# Patient Record
Sex: Female | Born: 1940 | Race: White | Hispanic: No | Smoking: Never smoker
Health system: Southern US, Community
[De-identification: ages and names within clinical notes are randomized; demographics above are authoritative.]

## PROBLEM LIST (undated history)

## (undated) DIAGNOSIS — J45909 Unspecified asthma, uncomplicated: Secondary | ICD-10-CM

## (undated) DIAGNOSIS — Z8739 Personal history of other diseases of the musculoskeletal system and connective tissue: Secondary | ICD-10-CM

## (undated) DIAGNOSIS — IMO0001 Reserved for inherently not codable concepts without codable children: Secondary | ICD-10-CM

## (undated) DIAGNOSIS — F419 Anxiety disorder, unspecified: Secondary | ICD-10-CM

## (undated) DIAGNOSIS — I499 Cardiac arrhythmia, unspecified: Secondary | ICD-10-CM

## (undated) DIAGNOSIS — J189 Pneumonia, unspecified organism: Secondary | ICD-10-CM

## (undated) DIAGNOSIS — I10 Essential (primary) hypertension: Secondary | ICD-10-CM

## (undated) DIAGNOSIS — I4891 Unspecified atrial fibrillation: Secondary | ICD-10-CM

## (undated) DIAGNOSIS — E785 Hyperlipidemia, unspecified: Secondary | ICD-10-CM

## (undated) HISTORY — PX: ANKLE FRACTURE SURGERY: SHX122

## (undated) HISTORY — PX: BACK SURGERY: SHX140

## (undated) HISTORY — PX: LUMBAR DISC SURGERY: SHX700

## (undated) HISTORY — PX: FRACTURE SURGERY: SHX138

## (undated) HISTORY — DX: Unspecified atrial fibrillation: I48.91

## (undated) HISTORY — PX: TONSILLECTOMY AND ADENOIDECTOMY: SUR1326

---

## 2000-06-16 ENCOUNTER — Emergency Department (HOSPITAL_COMMUNITY): Admission: EM | Admit: 2000-06-16 | Discharge: 2000-06-16 | Payer: Self-pay | Admitting: Emergency Medicine

## 2000-11-22 ENCOUNTER — Emergency Department (HOSPITAL_COMMUNITY): Admission: EM | Admit: 2000-11-22 | Discharge: 2000-11-22 | Payer: Self-pay | Admitting: *Deleted

## 2002-07-05 ENCOUNTER — Inpatient Hospital Stay (HOSPITAL_COMMUNITY): Admission: EM | Admit: 2002-07-05 | Discharge: 2002-07-15 | Payer: Self-pay | Admitting: *Deleted

## 2002-07-05 ENCOUNTER — Encounter: Payer: Self-pay | Admitting: Emergency Medicine

## 2002-07-09 ENCOUNTER — Encounter: Payer: Self-pay | Admitting: Internal Medicine

## 2002-07-11 ENCOUNTER — Encounter: Payer: Self-pay | Admitting: Internal Medicine

## 2002-07-13 ENCOUNTER — Encounter: Payer: Self-pay | Admitting: Internal Medicine

## 2003-10-10 ENCOUNTER — Emergency Department (HOSPITAL_COMMUNITY): Admission: EM | Admit: 2003-10-10 | Discharge: 2003-10-10 | Payer: Self-pay | Admitting: Emergency Medicine

## 2010-05-15 ENCOUNTER — Inpatient Hospital Stay (INDEPENDENT_AMBULATORY_CARE_PROVIDER_SITE_OTHER)
Admission: RE | Admit: 2010-05-15 | Discharge: 2010-05-15 | Disposition: A | Discharge status: Home / Self Care | Payer: Medicare Other | Source: Ambulatory Visit | Attending: Emergency Medicine | Admitting: Emergency Medicine

## 2010-05-15 ENCOUNTER — Ambulatory Visit (INDEPENDENT_AMBULATORY_CARE_PROVIDER_SITE_OTHER): Payer: Medicare Other

## 2010-05-15 DIAGNOSIS — R229 Localized swelling, mass and lump, unspecified: Secondary | ICD-10-CM

## 2010-05-15 DIAGNOSIS — M79609 Pain in unspecified limb: Secondary | ICD-10-CM

## 2010-05-15 LAB — CBC
MCH: 29.5 pg (ref 26.0–34.0)
MCHC: 33.2 g/dL (ref 30.0–36.0)
Platelets: 350 10*3/uL (ref 150–400)
RDW: 13.1 % (ref 11.5–15.5)

## 2010-05-15 LAB — URIC ACID: Uric Acid, Serum: 10.2 mg/dL — ABNORMAL HIGH (ref 2.4–7.0)

## 2013-01-19 ENCOUNTER — Encounter (HOSPITAL_COMMUNITY): Payer: Self-pay | Admitting: Emergency Medicine

## 2013-01-19 ENCOUNTER — Emergency Department (HOSPITAL_COMMUNITY)
Admission: EM | Admit: 2013-01-19 | Discharge: 2013-01-19 | Disposition: A | Discharge status: Home / Self Care | Payer: Medicare Other | Attending: Emergency Medicine | Admitting: Emergency Medicine

## 2013-01-19 ENCOUNTER — Emergency Department (HOSPITAL_COMMUNITY): Payer: Medicare Other

## 2013-01-19 DIAGNOSIS — R509 Fever, unspecified: Secondary | ICD-10-CM | POA: Insufficient documentation

## 2013-01-19 DIAGNOSIS — Z862 Personal history of diseases of the blood and blood-forming organs and certain disorders involving the immune mechanism: Secondary | ICD-10-CM | POA: Insufficient documentation

## 2013-01-19 DIAGNOSIS — Z8639 Personal history of other endocrine, nutritional and metabolic disease: Secondary | ICD-10-CM | POA: Insufficient documentation

## 2013-01-19 DIAGNOSIS — I10 Essential (primary) hypertension: Secondary | ICD-10-CM | POA: Insufficient documentation

## 2013-01-19 DIAGNOSIS — L02619 Cutaneous abscess of unspecified foot: Secondary | ICD-10-CM | POA: Insufficient documentation

## 2013-01-19 DIAGNOSIS — M25579 Pain in unspecified ankle and joints of unspecified foot: Secondary | ICD-10-CM | POA: Insufficient documentation

## 2013-01-19 DIAGNOSIS — J45909 Unspecified asthma, uncomplicated: Secondary | ICD-10-CM | POA: Insufficient documentation

## 2013-01-19 DIAGNOSIS — L03119 Cellulitis of unspecified part of limb: Secondary | ICD-10-CM

## 2013-01-19 DIAGNOSIS — L0390 Cellulitis, unspecified: Secondary | ICD-10-CM

## 2013-01-19 DIAGNOSIS — R209 Unspecified disturbances of skin sensation: Secondary | ICD-10-CM | POA: Insufficient documentation

## 2013-01-19 DIAGNOSIS — M79671 Pain in right foot: Secondary | ICD-10-CM

## 2013-01-19 HISTORY — DX: Unspecified asthma, uncomplicated: J45.909

## 2013-01-19 HISTORY — DX: Essential (primary) hypertension: I10

## 2013-01-19 MED ORDER — HYDROCODONE-ACETAMINOPHEN 5-325 MG PO TABS: 1.0000 | ORAL_TABLET | ORAL | Status: DC | PRN

## 2013-01-19 MED ORDER — SULFAMETHOXAZOLE-TRIMETHOPRIM 800-160 MG PO TABS: 1.0000 | ORAL_TABLET | Freq: Two times a day (BID) | ORAL | Status: AC

## 2013-01-19 MED ORDER — HYDROCODONE-ACETAMINOPHEN 5-325 MG PO TABS
1.0000 | ORAL_TABLET | Freq: Once | ORAL | Status: AC
  Administered 2013-01-19: 1 via ORAL
  Filled 2013-01-19: qty 1

## 2013-01-19 MED ORDER — IBUPROFEN 600 MG PO TABS: 600.0000 mg | ORAL_TABLET | Freq: Four times a day (QID) | ORAL | Status: DC | PRN

## 2013-01-19 MED ORDER — IBUPROFEN 200 MG PO TABS
600.0000 mg | ORAL_TABLET | Freq: Once | ORAL | Status: AC
  Administered 2013-01-19: 600 mg via ORAL
  Filled 2013-01-19: qty 3

## 2013-01-19 NOTE — ED Notes (Signed)
C/o right foot swollen and painful, started 2 days ago, states has happened before, did not decide what caused it

## 2013-01-19 NOTE — Discharge Instructions (Signed)
Possible Early Right Foot Cellulitis Cellulitis is an infection of the skin and the tissue beneath it. The infected area is usually red and tender. Cellulitis occurs most often in the arms and lower legs.  CAUSES  Cellulitis is caused by bacteria that enter the skin through cracks or cuts in the skin. The most common types of bacteria that cause cellulitis are Staphylococcus and Streptococcus. SYMPTOMS   Redness and warmth.  Swelling.  Tenderness or pain.  Fever. DIAGNOSIS  Your caregiver can usually determine what is wrong based on a physical exam. Blood tests may also be done. TREATMENT  Treatment usually involves taking an antibiotic medicine. HOME CARE INSTRUCTIONS   Take your antibiotics as directed. Finish them even if you start to feel better.  Keep the infected arm or leg elevated to reduce swelling.  Apply a warm cloth to the affected area up to 4 times per day to relieve pain.  Only take over-the-counter or prescription medicines for pain, discomfort, or fever as directed by your caregiver.  Keep all follow-up appointments as directed by your caregiver. SEEK MEDICAL CARE IF:   You notice red streaks coming from the infected area.  Your red area gets larger or turns dark in color.  Your bone or joint underneath the infected area becomes painful after the skin has healed.  Your infection returns in the same area or another area.  You notice a swollen bump in the infected area.  You develop new symptoms. SEEK IMMEDIATE MEDICAL CARE IF:   You have a fever.  You feel very sleepy.  You develop vomiting or diarrhea.  You have a general ill feeling (malaise) with muscle aches and pains. MAKE SURE YOU:   Understand these instructions.  Will watch your condition.  Will get help right away if you are not doing well or get worse. Document Released: 10/13/2004 Document Revised: 07/05/2011 Document Reviewed: 03/21/2011 [facility] Patient Information 2014  [place].  Possible Tendinitis Tendinitis is swelling and inflammation of the tendons. Tendons are band-like tissues that connect muscle to bone. Tendinitis commonly occurs in the:   Shoulders (rotator cuff).  Heels (Achilles tendon).  Elbows (triceps tendon). CAUSES Tendinitis is usually caused by overusing the tendon, muscles, and joints involved. When the tissue surrounding a tendon (synovium) becomes inflamed, it is called tenosynovitis. Tendinitis commonly develops in people whose jobs require repetitive motions. SYMPTOMS  Pain.  Tenderness.  Mild swelling. DIAGNOSIS Tendinitis is usually diagnosed by physical exam. Your caregiver may also order X-rays or other imaging tests. TREATMENT Your caregiver may recommend certain medicines or exercises for your treatment. HOME CARE INSTRUCTIONS   Use a sling or splint for as long as directed by your caregiver until the pain decreases.  Put ice on the injured area.  Put ice in a plastic bag.  Place a towel between your skin and the bag.  Leave the ice on for 15-20 minutes, 03-04 times a day.  Avoid using the limb while the tendon is painful. Perform gentle range of motion exercises only as directed by your caregiver. Stop exercises if pain or discomfort increase, unless directed otherwise by your caregiver.  Only take over-the-counter or prescription medicines for pain, discomfort, or fever as directed by your caregiver. SEEK MEDICAL CARE IF:   Your pain and swelling increase.  You develop new, unexplained symptoms, especially increased numbness in the hands. MAKE SURE YOU:   Understand these instructions.  Will watch your condition.  Will get help right away if you are  not doing well or get worse. Document Released: 01/01/2000 Document Revised: 03/28/2011 Document Reviewed: 03/22/2010 [facility] Patient Information 2014 [place], [place].  RICE: Routine Care for Injuries The routine care of many injuries includes  Rest, Ice, Compression, and Elevation (RICE). HOME CARE INSTRUCTIONS  Rest is needed to allow your body to heal. Routine activities can usually be resumed when comfortable. Injured tendons and bones can take up to 6 weeks to heal. Tendons are the cord-like structures that attach muscle to bone.  Ice following an injury helps keep the swelling down and reduces pain.  Put ice in a plastic bag.  Place a towel between your skin and the bag.  Leave the ice on for 15-20 minutes, 03-04 times a day. Do this while awake, for the first 24 to 48 hours. After that, continue as directed by your caregiver.  Compression helps keep swelling down. It also gives support and helps with discomfort. If an elastic bandage has been applied, it should be removed and reapplied every 3 to 4 hours. It should not be applied tightly, but firmly enough to keep swelling down. Watch fingers or toes for swelling, bluish discoloration, coldness, numbness, or excessive pain. If any of these problems occur, remove the bandage and reapply loosely. Contact your caregiver if these problems continue.  Elevation helps reduce swelling and decreases pain. With extremities, such as the arms, hands, legs, and feet, the injured area should be placed near or above the level of the heart, if possible. SEEK IMMEDIATE MEDICAL CARE IF:  You have persistent pain and swelling.  You develop redness, numbness, or unexpected weakness.  Your symptoms are getting worse rather than improving after several days. These symptoms may indicate that further evaluation or further X-rays are needed. Sometimes, X-rays may not show a small broken bone (fracture) until 1 week or 10 days later. Make a follow-up appointment with your caregiver. Ask when your X-ray results will be ready. Make sure you get your X-ray results. Document Released: 04/17/2000 Document Revised: 03/28/2011 Document Reviewed: 06/04/2010 [facility] Patient Information 2014 [place],  [place].

## 2013-01-19 NOTE — ED Provider Notes (Signed)
TIME SEEN: 4:27 PM  CHIEF COMPLAINT: Right foot pain  HPI: Patient is a 73 year old female with a history of hypertension, asthma, gout who presents emergency department with right foot pain that started yesterday. She states that over the holidays she's been walking more than normal and wearing no shoes. She denies any history of any injury. She states that she has had some numbness in the top of her foot secondary to swelling. There is some redness and warmth but she denies any fevers or chills, nausea, vomiting or diarrhea. She has a history of gout but states this feels different.  ROS: See HPI Constitutional: no fever  Eyes: no drainage  ENT: no runny nose   Cardiovascular:  no chest pain  Resp: no SOB  GI: no vomiting GU: no dysuria Integumentary: no rash  Allergy: no hives  Musculoskeletal: no leg swelling  Neurological: no slurred speech ROS otherwise negative  PAST MEDICAL HISTORY/PAST SURGICAL HISTORY:  Past Medical History  Diagnosis Date  . Hypertension   . Asthma     MEDICATIONS:  Prior to Admission medications   Not on File    ALLERGIES:  No Known Allergies  SOCIAL HISTORY:  History  Substance Use Topics  . Smoking status: Never Smoker   . Smokeless tobacco: Not on file  . Alcohol Use: Yes     Comment: occ    FAMILY HISTORY: History reviewed. No pertinent family history.  EXAM: BP 145/70  Pulse 95  Temp(Src) 99.5 F (37.5 C) (Oral)  Resp 20  SpO2 96% CONSTITUTIONAL: Alert and oriented and responds appropriately to questions. Well-appearing; well-nourished HEAD: Normocephalic EYES: Conjunctivae clear, PERRL ENT: normal nose; no rhinorrhea; moist mucous membranes; pharynx without lesions noted NECK: Supple, no meningismus, no LAD  CARD: RRR; S1 and S2 appreciated; no murmurs, no clicks, no rubs, no gallops RESP: Normal chest excursion without splinting or tachypnea; breath sounds clear and equal bilaterally; no wheezes, no rhonchi, no rales,   ABD/GI: Normal bowel sounds; non-distended; soft, non-tender, no rebound, no guarding BACK:  The back appears normal and is non-tender to palpation, there is no CVA tenderness EXT: Patient has right dorsal soft tissue swelling with erythema to her toes diffusely and warmth, no induration, 2+ DP pulses bilaterally, no posterior calf tenderness, full range of motion in all joints, no joint effusion, no fluctuance, drainage, no open wound, no bony tenderness, otherwise Normal ROM in all joints; non-tender to palpation; no edema; normal capillary refill; no cyanosis    SKIN: Normal color for age and race; warm NEURO: Moves all extremities equally PSYCH: The patient's mood and manner are appropriate. Grooming and personal hygiene are appropriate.  MEDICAL DECISION MAKING: Patient here with temp of 99.5 and redness, warmth and swelling to her right foot. It is not localized to any joint. Do not suspect septic arthritis or gout. Possible early cellulitis versus sprain, tendinitis from increased ambulation and wearing new shoes. X-rays pending. We'll give pain medication. Anticipate discharge home with outpatient followup, oral antibiotics, pain medication. Patient reports she believes she has a walker at home.  ED PROGRESS: X-ray show no subcutaneous gas, fracture or dislocation. Suspect possible early cellulitis versus sprain versus tendinitis. We'll discharge home with pain medication and Bactrim. Given return precautions and orthopedic followup. Patient has a walker at home. Patient and family verbalized understanding and are comfortable with plan.     [place], DO 01/19/13 1723

## 2013-02-13 ENCOUNTER — Other Ambulatory Visit: Payer: Self-pay | Admitting: Family Medicine

## 2013-02-13 DIAGNOSIS — R29818 Other symptoms and signs involving the nervous system: Secondary | ICD-10-CM

## 2013-02-13 DIAGNOSIS — R2990 Unspecified symptoms and signs involving the nervous system: Secondary | ICD-10-CM

## 2013-02-14 ENCOUNTER — Ambulatory Visit
Admission: RE | Admit: 2013-02-14 | Discharge: 2013-02-14 | Disposition: A | Discharge status: Home / Self Care | Payer: Medicare Other | Source: Ambulatory Visit | Attending: Family Medicine | Admitting: Family Medicine

## 2013-02-14 DIAGNOSIS — R2990 Unspecified symptoms and signs involving the nervous system: Secondary | ICD-10-CM

## 2013-02-20 ENCOUNTER — Encounter: Payer: Self-pay | Admitting: Cardiology

## 2013-02-20 ENCOUNTER — Other Ambulatory Visit (HOSPITAL_COMMUNITY): Payer: Self-pay | Admitting: *Deleted

## 2013-02-20 ENCOUNTER — Ambulatory Visit (HOSPITAL_COMMUNITY): Payer: Medicare Other | Attending: Cardiology | Admitting: Cardiology

## 2013-02-20 DIAGNOSIS — I4891 Unspecified atrial fibrillation: Secondary | ICD-10-CM

## 2013-02-20 DIAGNOSIS — E785 Hyperlipidemia, unspecified: Secondary | ICD-10-CM | POA: Insufficient documentation

## 2013-02-20 DIAGNOSIS — I059 Rheumatic mitral valve disease, unspecified: Secondary | ICD-10-CM | POA: Insufficient documentation

## 2013-02-20 DIAGNOSIS — I10 Essential (primary) hypertension: Secondary | ICD-10-CM | POA: Insufficient documentation

## 2013-02-20 NOTE — Progress Notes (Signed)
Echo performed. 

## 2013-02-25 ENCOUNTER — Other Ambulatory Visit: Payer: Medicare Other

## 2013-03-31 ENCOUNTER — Emergency Department (INDEPENDENT_AMBULATORY_CARE_PROVIDER_SITE_OTHER): Payer: Medicare Other

## 2013-03-31 ENCOUNTER — Emergency Department (HOSPITAL_COMMUNITY)
Admission: EM | Admit: 2013-03-31 | Discharge: 2013-03-31 | Disposition: A | Discharge status: Home / Self Care | Payer: Medicare Other | Source: Home / Self Care | Attending: Family Medicine | Admitting: Family Medicine

## 2013-03-31 DIAGNOSIS — M109 Gout, unspecified: Secondary | ICD-10-CM

## 2013-03-31 LAB — CBC WITH DIFFERENTIAL/PLATELET
Basophils Absolute: 0 10*3/uL (ref 0.0–0.1)
Basophils Relative: 0 % (ref 0–1)
EOS ABS: 0 10*3/uL (ref 0.0–0.7)
EOS PCT: 0 % (ref 0–5)
HEMATOCRIT: 39.7 % (ref 36.0–46.0)
HEMOGLOBIN: 13.2 g/dL (ref 12.0–15.0)
LYMPHS ABS: 1.3 10*3/uL (ref 0.7–4.0)
Lymphocytes Relative: 8 % — ABNORMAL LOW (ref 12–46)
MCH: 29.3 pg (ref 26.0–34.0)
MCHC: 33.2 g/dL (ref 30.0–36.0)
MCV: 88 fL (ref 78.0–100.0)
MONO ABS: 1.2 10*3/uL — AB (ref 0.1–1.0)
MONOS PCT: 8 % (ref 3–12)
NEUTROS PCT: 83 % — AB (ref 43–77)
Neutro Abs: 12.6 10*3/uL — ABNORMAL HIGH (ref 1.7–7.7)
Platelets: 309 10*3/uL (ref 150–400)
RBC: 4.51 MIL/uL (ref 3.87–5.11)
RDW: 14 % (ref 11.5–15.5)
WBC: 16 10*3/uL — ABNORMAL HIGH (ref 4.0–10.5)

## 2013-03-31 LAB — POCT I-STAT, CHEM 8
BUN: 19 mg/dL (ref 6–23)
CALCIUM ION: 1.17 mmol/L (ref 1.13–1.30)
CHLORIDE: 103 meq/L (ref 96–112)
Creatinine, Ser: 1 mg/dL (ref 0.50–1.10)
Glucose, Bld: 191 mg/dL — ABNORMAL HIGH (ref 70–99)
HCT: 43 % (ref 36.0–46.0)
Hemoglobin: 14.6 g/dL (ref 12.0–15.0)
Potassium: 3.6 mEq/L — ABNORMAL LOW (ref 3.7–5.3)
Sodium: 142 mEq/L (ref 137–147)
TCO2: 25 mmol/L (ref 0–100)

## 2013-03-31 LAB — D-DIMER, QUANTITATIVE (NOT AT ARMC): D DIMER QUANT: 0.97 ug{FEU}/mL — AB (ref 0.00–0.48)

## 2013-03-31 LAB — SEDIMENTATION RATE: Sed Rate: 27 mm/hr — ABNORMAL HIGH (ref 0–22)

## 2013-03-31 MED ORDER — INDOMETHACIN 25 MG PO CAPS: 25.0000 mg | ORAL_CAPSULE | Freq: Three times a day (TID) | ORAL | Status: DC | PRN

## 2013-03-31 MED ORDER — HYDROCODONE-ACETAMINOPHEN 5-325 MG PO TABS: 1.0000 | ORAL_TABLET | Freq: Four times a day (QID) | ORAL | Status: DC | PRN

## 2013-03-31 MED ORDER — METHYLPREDNISOLONE ACETATE 40 MG/ML IJ SUSP
80.0000 mg | Freq: Once | INTRAMUSCULAR | Status: AC
  Administered 2013-03-31: 80 mg via INTRAMUSCULAR

## 2013-03-31 MED ORDER — METHYLPREDNISOLONE ACETATE 80 MG/ML IJ SUSP
INTRAMUSCULAR | Status: AC
  Filled 2013-03-31: qty 1

## 2013-03-31 MED ORDER — COLCHICINE 0.6 MG PO TABS: 0.6000 mg | ORAL_TABLET | Freq: Two times a day (BID) | ORAL | Status: DC

## 2013-03-31 NOTE — ED Notes (Signed)
Reports swelling of right index finger x 1 wk.  Swelling through out hand started Friday.    Pt states that on Thursday prior to whole hand swelling she past out while on the toilet hitting the floor pt is unsure if the swelling is due to fall.    New medication for tachy cardia started one week ago.    Pt has tried soaking hand with no relief .

## 2013-03-31 NOTE — ED Provider Notes (Signed)
Medical screening examination/treatment/procedure(s) were performed by resident physician or non-physician practitioner and as supervising physician I was immediately available for consultation/collaboration.   Pauline Good MD.   Billy Fischer, MD 03/31/13 [phone]

## 2013-03-31 NOTE — Discharge Instructions (Signed)

## 2013-03-31 NOTE — ED Provider Notes (Signed)
CSN: [id number]     Arrival date & time 03/31/13  1237 History   First MD Initiated Contact with Patient 03/31/13 1420     Chief Complaint  Patient presents with  . Hand Pain   (Consider location/radiation/quality/duration/timing/severity/associated sxs/prior Treatment) HPI Comments: 73 year old female presents for evaluation of right hand pain, redness, swelling. This started about one week ago, getting progressively worse. She has the day before this began, she had a fall in the bathroom but did not immediately have any pain. She does not think she hit her hand on anything but she cannot be sure. A few days before this began, she has what she describes as a painful cyst in the end of her right ring finger. She says she has had similar issues to this in her feet before. Her doctor initially thought she had gout but she says they ruled this out. No fever, chills, NVD, SOB.  No numbness in the hand.    Patient is a 73 y.o. female presenting with hand pain.  Hand Pain Pertinent negatives include no chest pain, no abdominal pain and no shortness of breath.    Past Medical History  Diagnosis Date  . Hypertension   . Asthma    Past Surgical History  Procedure Laterality Date  . Back surgery     No family history on file. History  Substance Use Topics  . Smoking status: Never Smoker   . Smokeless tobacco: Not on file  . Alcohol Use: Yes     Comment: occ   OB History   Grav Para Term Preterm Abortions TAB SAB Ect Mult Living                 Review of Systems  Constitutional: Negative for fever and chills.  Eyes: Negative for visual disturbance.  Respiratory: Negative for cough and shortness of breath.   Cardiovascular: Negative for chest pain, palpitations and leg swelling.  Gastrointestinal: Negative for nausea, vomiting and abdominal pain.  Endocrine: Negative for polydipsia and polyuria.  Genitourinary: Negative for dysuria, urgency and frequency.  Musculoskeletal: Positive  for arthralgias and joint swelling. Negative for myalgias.  Skin: Negative for rash.  Neurological: Negative for dizziness, weakness and light-headedness.    Allergies  Review of patient's allergies indicates no known allergies.  Home Medications   Current Outpatient Rx  Name  Route  Sig  Dispense  Refill  . atorvastatin (LIPITOR) 20 MG tablet   Oral   Take 20 mg by mouth daily.         . metoprolol succinate (TOPROL-XL) 25 MG 24 hr tablet   Oral   Take 25 mg by mouth daily.         . pramipexole (MIRAPEX) 0.125 MG tablet   Oral   Take 0.125 mg by mouth 3 (three) times daily.         . TRIAMTERENE PO   Oral   Take by mouth.         . colchicine 0.6 MG tablet   Oral   Take 1 tablet (0.6 mg total) by mouth 2 (two) times daily.   20 tablet   0     Take until hand pain resolves   . HYDROcodone-acetaminophen (NORCO) 5-325 MG per tablet   Oral   Take 1 tablet by mouth every 6 (six) hours as needed for moderate pain.   15 tablet   0   . HYDROcodone-acetaminophen (NORCO/VICODIN) 5-325 MG per tablet   Oral   Take 1  tablet by mouth every 4 (four) hours as needed.   15 tablet   0   . ibuprofen (ADVIL,MOTRIN) 600 MG tablet   Oral   Take 1 tablet (600 mg total) by mouth every 6 (six) hours as needed.   15 tablet   0   . indomethacin (INDOCIN) 25 MG capsule   Oral   Take 1 capsule (25 mg total) by mouth 3 (three) times daily as needed.   30 capsule   0     Take until hand pain resolves    BP 151/75  Pulse 131  Temp(Src) 98.5 F (36.9 C) (Oral)  Resp 18  SpO2 99% Physical Exam  Nursing note and vitals reviewed. Constitutional: She is oriented to person, place, and time. Vital signs are normal. She appears well-developed and well-nourished. No distress.  HENT:  Head: Normocephalic and atraumatic.  Cardiovascular: Regular rhythm and normal heart sounds.  Tachycardia present.  Exam reveals no gallop.   No murmur heard. Pulmonary/Chest: Effort normal.  No respiratory distress.  Musculoskeletal:       Right hand: She exhibits tenderness and swelling. She exhibits normal capillary refill.       Hands: Neurological: She is alert and oriented to person, place, and time. She has normal strength. Coordination normal.  Skin: Skin is warm and dry. No rash noted. She is not diaphoretic.  Psychiatric: She has a normal mood and affect. Judgment normal.    ED Course  Procedures (including critical care time) Labs Review Labs Reviewed  CBC WITH DIFFERENTIAL - Abnormal; Notable for the following:    WBC 16.0 (*)    All other components within normal limits  D-DIMER, QUANTITATIVE - Abnormal; Notable for the following:    D-Dimer, Quant 0.97 (*)    All other components within normal limits  POCT I-STAT, CHEM 8 - Abnormal; Notable for the following:    Potassium 3.6 (*)    Glucose, Bld 191 (*)    All other components within normal limits  SEDIMENTATION RATE   Imaging Review Dg Wrist Complete Right  03/31/2013   CLINICAL DATA:  Right wrist pain and swelling.  EXAM: RIGHT WRIST - COMPLETE 3+ VIEW  COMPARISON:  None.  FINDINGS: There is no evidence of acute fracture or dislocation. There is no evidence of arthropathy or other focal bone abnormality. Soft tissues are unremarkable.  Ulnar minus variant noted. A distal radial ulnar joint degenerative changes seen as well as chronic deformity of the distal ulna which may be posttraumatic.  IMPRESSION: No acute findings.  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   Electronically Signed   By: Earle Gell M.D.   On: 03/31/2013 14:29   Dg Hand Complete Right  03/31/2013   CLINICAL DATA:  Right hand pain and swelling.  EXAM: RIGHT HAND - COMPLETE 3+ VIEW  COMPARISON:  None.  FINDINGS: Diffuse soft tissue swelling is seen. No evidence of soft tissue gas or radiopaque foreign body. There is no evidence of acute fracture or dislocation.  Generalized osteopenia noted. Mild degenerative  changes seen involving the interphalangeal joints. Degenerative changes also seen involving the distal radial ulnar joint.  IMPRESSION: Diffuse soft tissue swelling. No evidence of fracture or radiopaque foreign body.  Mild interphalangeal joint degenerative spurring and osteopenia.   Electronically Signed   By: Earle Gell M.D.   On: 03/31/2013 14:27   US performed with Leafy Kindle, PA-C, who also works in rheumatology and is very familiar with [facility]  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  The first digit has the appearance of synovitis that would be expected with acute gout flare.    MDM   1. Acute gout    Depo-Medrol given here. Treating with Indocin, colchicine, analgesics. Patient will followup with ortho hand.  May f/u sooner here if worsening.    Case discussed with attending, leukocytosis and elevated d-dimer can both be explained by acute gout flare.  New Prescriptions   COLCHICINE 0.6 MG TABLET    Take 1 tablet (0.6 mg total) by mouth 2 (two) times daily.   HYDROCODONE-ACETAMINOPHEN (NORCO) 5-325 MG PER TABLET    Take 1 tablet by mouth every 6 (six) hours as needed for moderate pain.   INDOMETHACIN (INDOCIN) 25 MG CAPSULE    Take 1 capsule (25 mg total) by mouth 3 (three) times daily as needed.     Liam Graham, PA-C 03/31/13 [phone]

## 2013-04-01 NOTE — ED Notes (Signed)
Sed Rate 27 H, WBC 16.0 H.  Discussed with Archie Balboa PA.  He said he has reviewed them and discussed with Dr. Juventino Slovak.  He asked me to call pt. for clinical improvement. Cristina [name] 04/01/2013

## 2013-04-02 ENCOUNTER — Telehealth (HOSPITAL_COMMUNITY): Payer: Self-pay | Admitting: Emergency Medicine

## 2013-04-02 NOTE — ED Notes (Signed)
I called and pt.'s daughter answered.  She said she talked to mother a few minutes ago and she is improved.  I told her that her mother needs to f/u with her PCP and may need a long term medication to lower her uric acid level.  Will send message to [employer] PA. Roselyn Meier 04/02/2013

## 2013-07-22 ENCOUNTER — Encounter (HOSPITAL_COMMUNITY): Payer: Self-pay | Admitting: Emergency Medicine

## 2013-07-22 ENCOUNTER — Emergency Department (HOSPITAL_COMMUNITY): Payer: Medicare Other

## 2013-07-22 ENCOUNTER — Inpatient Hospital Stay (HOSPITAL_COMMUNITY)
Admission: EM | Admit: 2013-07-22 | Discharge: 2013-07-28 | DRG: 308 | Disposition: A | Discharge status: Home / Self Care | Payer: Medicare Other | Attending: Cardiovascular Disease | Admitting: Cardiovascular Disease

## 2013-07-22 DIAGNOSIS — I4819 Other persistent atrial fibrillation: Secondary | ICD-10-CM

## 2013-07-22 DIAGNOSIS — IMO0002 Reserved for concepts with insufficient information to code with codable children: Secondary | ICD-10-CM | POA: Diagnosis present

## 2013-07-22 DIAGNOSIS — I4891 Unspecified atrial fibrillation: Principal | ICD-10-CM | POA: Diagnosis present

## 2013-07-22 DIAGNOSIS — I5033 Acute on chronic diastolic (congestive) heart failure: Secondary | ICD-10-CM | POA: Diagnosis present

## 2013-07-22 DIAGNOSIS — D481 Neoplasm of uncertain behavior of connective and other soft tissue: Secondary | ICD-10-CM | POA: Diagnosis present

## 2013-07-22 DIAGNOSIS — I5031 Acute diastolic (congestive) heart failure: Secondary | ICD-10-CM | POA: Diagnosis present

## 2013-07-22 DIAGNOSIS — R600 Localized edema: Secondary | ICD-10-CM

## 2013-07-22 DIAGNOSIS — M25461 Effusion, right knee: Secondary | ICD-10-CM | POA: Diagnosis not present

## 2013-07-22 DIAGNOSIS — R0601 Orthopnea: Secondary | ICD-10-CM

## 2013-07-22 DIAGNOSIS — D4819 Other specified neoplasm of uncertain behavior of connective and other soft tissue: Secondary | ICD-10-CM | POA: Diagnosis present

## 2013-07-22 DIAGNOSIS — M7989 Other specified soft tissue disorders: Secondary | ICD-10-CM | POA: Diagnosis not present

## 2013-07-22 DIAGNOSIS — E669 Obesity, unspecified: Secondary | ICD-10-CM

## 2013-07-22 DIAGNOSIS — Z6829 Body mass index (BMI) 29.0-29.9, adult: Secondary | ICD-10-CM

## 2013-07-22 DIAGNOSIS — R609 Edema, unspecified: Secondary | ICD-10-CM | POA: Diagnosis present

## 2013-07-22 DIAGNOSIS — M109 Gout, unspecified: Secondary | ICD-10-CM

## 2013-07-22 DIAGNOSIS — R635 Abnormal weight gain: Secondary | ICD-10-CM | POA: Diagnosis present

## 2013-07-22 DIAGNOSIS — Z79899 Other long term (current) drug therapy: Secondary | ICD-10-CM

## 2013-07-22 DIAGNOSIS — M25469 Effusion, unspecified knee: Secondary | ICD-10-CM | POA: Diagnosis present

## 2013-07-22 DIAGNOSIS — I10 Essential (primary) hypertension: Secondary | ICD-10-CM | POA: Diagnosis present

## 2013-07-22 DIAGNOSIS — R000 Tachycardia, unspecified: Secondary | ICD-10-CM | POA: Diagnosis present

## 2013-07-22 DIAGNOSIS — I119 Hypertensive heart disease without heart failure: Secondary | ICD-10-CM | POA: Diagnosis present

## 2013-07-22 DIAGNOSIS — I509 Heart failure, unspecified: Secondary | ICD-10-CM | POA: Diagnosis present

## 2013-07-22 DIAGNOSIS — I700 Atherosclerosis of aorta: Secondary | ICD-10-CM | POA: Diagnosis present

## 2013-07-22 DIAGNOSIS — E876 Hypokalemia: Secondary | ICD-10-CM | POA: Diagnosis present

## 2013-07-22 DIAGNOSIS — R9430 Abnormal result of cardiovascular function study, unspecified: Secondary | ICD-10-CM | POA: Diagnosis present

## 2013-07-22 DIAGNOSIS — Z7901 Long term (current) use of anticoagulants: Secondary | ICD-10-CM

## 2013-07-22 DIAGNOSIS — J45909 Unspecified asthma, uncomplicated: Secondary | ICD-10-CM | POA: Diagnosis present

## 2013-07-22 DIAGNOSIS — I110 Hypertensive heart disease with heart failure: Secondary | ICD-10-CM | POA: Diagnosis present

## 2013-07-22 LAB — COMPREHENSIVE METABOLIC PANEL
ALK PHOS: 117 U/L (ref 39–117)
ALT: 18 U/L (ref 0–35)
AST: 13 U/L (ref 0–37)
Albumin: 3.7 g/dL (ref 3.5–5.2)
Anion gap: 16 — ABNORMAL HIGH (ref 5–15)
BILIRUBIN TOTAL: 0.4 mg/dL (ref 0.3–1.2)
BUN: 26 mg/dL — ABNORMAL HIGH (ref 6–23)
CHLORIDE: 97 meq/L (ref 96–112)
CO2: 23 meq/L (ref 19–32)
Calcium: 9.4 mg/dL (ref 8.4–10.5)
Creatinine, Ser: 0.84 mg/dL (ref 0.50–1.10)
GFR calc non Af Amer: 67 mL/min — ABNORMAL LOW (ref >90)
GFR, EST AFRICAN AMERICAN: 77 mL/min — AB (ref >90)
GLUCOSE: 97 mg/dL (ref 70–99)
Potassium: 3.8 mEq/L (ref 3.7–5.3)
Sodium: 136 mEq/L — ABNORMAL LOW (ref 137–147)
Total Protein: 6.4 g/dL (ref 6.0–8.3)

## 2013-07-22 LAB — CBC
HCT: 38.1 % (ref 36.0–46.0)
Hemoglobin: 12.5 g/dL (ref 12.0–15.0)
MCH: 28.9 pg (ref 26.0–34.0)
MCHC: 32.8 g/dL (ref 30.0–36.0)
MCV: 88.2 fL (ref 78.0–100.0)
Platelets: 381 10*3/uL (ref 150–400)
RBC: 4.32 MIL/uL (ref 3.87–5.11)
RDW: 13.8 % (ref 11.5–15.5)
WBC: 15.4 10*3/uL — AB (ref 4.0–10.5)

## 2013-07-22 LAB — I-STAT TROPONIN, ED: Troponin i, poc: 0 ng/mL (ref 0.00–0.08)

## 2013-07-22 LAB — PRO B NATRIURETIC PEPTIDE: Pro B Natriuretic peptide (BNP): 2246 pg/mL — ABNORMAL HIGH (ref 0–125)

## 2013-07-22 MED ORDER — FUROSEMIDE 10 MG/ML IJ SOLN
40.0000 mg | Freq: Two times a day (BID) | INTRAMUSCULAR | Status: DC
  Administered 2013-07-22 – 2013-07-23 (×3): 40 mg via INTRAVENOUS
  Filled 2013-07-22 (×3): qty 4

## 2013-07-22 MED ORDER — LISINOPRIL 40 MG PO TABS
40.0000 mg | ORAL_TABLET | Freq: Every morning | ORAL | Status: DC
  Administered 2013-07-23 – 2013-07-28 (×6): 40 mg via ORAL
  Filled 2013-07-22 (×6): qty 1
  Filled 2013-07-22: qty 4

## 2013-07-22 MED ORDER — ZOLPIDEM TARTRATE 5 MG PO TABS: 5.0000 mg | ORAL_TABLET | Freq: Every evening | ORAL | Status: DC | PRN

## 2013-07-22 MED ORDER — DILTIAZEM LOAD VIA INFUSION
10.0000 mg | Freq: Once | INTRAVENOUS | Status: AC
  Administered 2013-07-22: 10 mg via INTRAVENOUS
  Filled 2013-07-22: qty 10

## 2013-07-22 MED ORDER — DILTIAZEM HCL 100 MG IV SOLR
5.0000 mg/h | INTRAVENOUS | Status: DC
  Administered 2013-07-22: 5 mg/h via INTRAVENOUS
  Filled 2013-07-22: qty 100

## 2013-07-22 MED ORDER — NITROGLYCERIN 0.4 MG SL SUBL: 0.4000 mg | SUBLINGUAL_TABLET | SUBLINGUAL | Status: DC | PRN

## 2013-07-22 MED ORDER — ACETAMINOPHEN 325 MG PO TABS
650.0000 mg | ORAL_TABLET | ORAL | Status: DC | PRN
  Administered 2013-07-24 – 2013-07-25 (×2): 650 mg via ORAL
  Filled 2013-07-22 (×2): qty 2

## 2013-07-22 MED ORDER — ONDANSETRON HCL 4 MG/2ML IJ SOLN
4.0000 mg | Freq: Four times a day (QID) | INTRAMUSCULAR | Status: DC | PRN
  Administered 2013-07-24: 4 mg via INTRAVENOUS
  Filled 2013-07-22: qty 2

## 2013-07-22 MED ORDER — PRAMIPEXOLE DIHYDROCHLORIDE 1 MG PO TABS
2.0000 mg | ORAL_TABLET | Freq: Every morning | ORAL | Status: DC
  Administered 2013-07-23 – 2013-07-28 (×6): 2 mg via ORAL
  Filled 2013-07-22 (×6): qty 2

## 2013-07-22 MED ORDER — ALPRAZOLAM 0.25 MG PO TABS
0.2500 mg | ORAL_TABLET | Freq: Two times a day (BID) | ORAL | Status: DC | PRN
  Administered 2013-07-25 – 2013-07-27 (×3): 0.25 mg via ORAL
  Filled 2013-07-22 (×3): qty 1

## 2013-07-22 NOTE — ED Notes (Signed)
Pt up to bedside commode.  Per cardiology pt to remain on monitor and not to ambulate to the bathroom.

## 2013-07-22 NOTE — ED Notes (Addendum)
Pt sts woke up today feeling SOB, has hx of asthma. Pt speaking in full sentences in triage, in NAD

## 2013-07-22 NOTE — H&P (Addendum)
The patient was seen and examined, and I agree with the assessment and plan as documented above, with modifications as noted below. Pt presented with increasing leg swelling and dyspnea with exertion over last 7-10 days, with roughly 10 lb weight gain. She was started on prednisone by PCP and felt that this alleviated her right foot tenderness and discomfort. She presented to the ED and was found to be in rapid atrial fibrillation with pulmonary vascular congestion, with elevated BNP 2246. She denies a prior history of CHF, diabetes, stroke, and MI. Currently on diltiazem drip and IV Lasix has been started. Currently, HR in 85-95 bpm range and pt's symptoms have improved. Echo in 02/2013 demonstrated normal LV systolic function, EF 60-60%, with grade I diastolic dysfunction.  RECS: Will continue diltiazem drip for HR control, with eventual institution of long-acting oral diltiazem and perhaps the addition of a low-dose beta blocker, as she had been taking long-acting verapamil prior to admission with ineffective HR control. Will start heparin. CHADS-VASC score is 4 (CHF, HTN, age, gender), and will thus require long term anticoagulation. Would recommend target-specific agent such as Xarelto prior to discharge. Will diurese with Lasix 40 mg IV bid for acute diastolic heart failure.

## 2013-07-22 NOTE — H&P (Signed)
CARDIOLOGY CONSULT NOTE   Patient ID: Cristina [name] MRN: [id number] DOB/AGE: [birth year] 73 y.o.  Admit date: 07/22/2013  Primary Physician   Cristina Solo, MD Primary Cardiologist   New Reason for Consultation   CHF, afib   Cristina [name] is a 73 y.o. female with no history of CAD.  She has asthma and HTN, has been evaluated for an abnormal heart rate/rhythm detected by her primary care physician, but cannot describe the procedures that were done.   For approximately the last 2 weeks, she has been noticing lower extremity edema and weight gain. She feels she has gained about 10 pounds. She has noticed increasing dyspnea on exertion and describes orthopnea but is not clear about PND. She was diagnosed with a rapid heartbeat, possibly irregular by her primary care physician a few months ago. She was not placed on blood thinners but states she was on Toprol for this. She never has palpitations and is never aware of a rapid or irregular heart rate. Her primary care physician had placed her on prednisone but this did not help her symptoms. She finally came to the emergency room when her shortness of breath worsened. In the emergency room, she was in atrial fibrillation with rapid ventricular response and started on IV Cardizem. Her breathing has not improved but her heart rate is lower than on admission.    Past Medical History  Diagnosis Date  . Hypertension   . Asthma      Past Surgical History  Procedure Laterality Date  . Back surgery      No Known Allergies  I have reviewed the patient's current medications   . diltiazem (CARDIZEM) infusion 5 mg/hr (07/22/13 1755)     Prior to Admission medications   Medication Sig Start Date End Date Taking? Authorizing Provider  lisinopril (PRINIVIL,ZESTRIL) 40 MG tablet Take 80 mg by mouth every morning.   Yes Historical Provider, MD  naproxen sodium (ANAPROX) 220 MG tablet Take 220 mg by mouth 2 (two) times daily as  needed (pain).   Yes Historical Provider, MD  pramipexole (MIRAPEX) 1 MG tablet Take 2 mg by mouth every morning.   Yes Historical Provider, MD  triamterene-hydrochlorothiazide (DYAZIDE) 37.5-25 MG per capsule Take 1 capsule by mouth every morning.   Yes Historical Provider, MD  verapamil (CALAN-SR) 240 MG CR tablet Take 240 mg by mouth at bedtime.   Yes Historical Provider, MD     History   Social History  . Marital Status: Divorced    Spouse Name: N/A    Number of Children: N/A  . Years of Education: N/A   Occupational History  . Retired    Social History Main Topics  . Smoking status: Never Smoker   . Smokeless tobacco: Not on file  . Alcohol Use: Yes     Comment: occ  . Drug Use: Not on file  . Sexual Activity: Not on file   Other Topics Concern  . Not on file   Social History Narrative   Lives in [place]. No family history of premature coronary artery disease in either her parents or siblings of which she is aware.      ROS:  Full 14 point review of systems complete and found to be negative unless listed above.  Physical Exam: Blood pressure 142/91, pulse 95, temperature 98.1 F (36.7 C), temperature source Oral, resp. rate 13, SpO2 98.00%.  General: Well developed, well nourished, female in no acute distress Head: Eyes  PERRLA, No xanthomas.   Normocephalic and atraumatic, oropharynx without edema or exudate. Dentition: Good Lungs: Decreased breath sounds bases with a few rales Heart: Heart irregular rate and rhythm with S1, S2  murmur. pulses are 2+ all 4 extrem.   Neck: No carotid bruits. No lymphadenopathy.  JVD at 10 cm. Abdomen: Bowel sounds present, abdomen soft and non-tender without masses or hernias noted. Msk:  No spine or cva tenderness. No weakness, no joint deformities or effusions. Extremities: No clubbing or cyanosis. 1+ bilateral lower extremity edema.  Neuro: Alert and oriented X 3. No focal deficits noted. Psych:  Good affect, responds  appropriately Skin: No rashes or lesions noted.  Labs:   Lab Results  Component Value Date   WBC 15.4* 07/22/2013   HGB 12.5 07/22/2013   HCT 38.1 07/22/2013   MCV 88.2 07/22/2013   PLT 381 07/22/2013     Recent Labs Lab 07/22/13 1651  NA 136*  K 3.8  CL 97  CO2 23  BUN 26*  CREATININE 0.84  CALCIUM 9.4  PROT 6.4  BILITOT 0.4  ALKPHOS 117  ALT 18  AST 13  GLUCOSE 97  ALBUMIN 3.7    Recent Labs  07/22/13 1716  TROPIPOC 0.00   Pro B Natriuretic peptide (BNP)  Date/Time Value Ref Range Status  07/22/2013  5:10 PM 2246.0* 0 - 125 pg/mL Final    ECG: 07/22/2013 Atrial fibrillation, rapid ventricular response Vent. rate 114 BPM PR interval * ms QRS duration 87 ms QT/QTc 335/461 ms P-R-T axes -1 95 36  Radiology:  Dg Chest 2 View 07/22/2013   CLINICAL DATA:  Shortness of breath, history hypertension, asthma  EXAM: CHEST  2 VIEW  COMPARISON:  None.  FINDINGS: Enlargement of cardiac silhouette with pulmonary vascular congestion.  Calcified tortuous thoracic aorta.  Bronchitic changes with minimal RIGHT basilar atelectasis.  No acute infiltrate, pleural effusion, or pneumothorax.  Multiple old appearing BILATERAL rib fractures.  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  IMPRESSION: Enlargement of cardiac silhouette with pulmonary vascular congestion.  Chronic bronchitic changes.  No acute infiltrate.  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   Electronically Signed   By: Cristina [name] M.D.   On: 07/22/2013 17:46    ASSESSMENT AND PLAN:   The patient was seen today by Cristina [name], the patient evaluated and the data reviewed.  Principal Problem:   CHF  (congestive heart failure) - admit, diuresis, check echo. If EF is decreased, will need right/left heart cath. Will cycle enzymes as well.  Active Problems:   Atrial fibrillation, rapid - rate improved with IV Cardizem. Continued this and titrate for improved rate control. Duration is unknown.    Hypertension - we'll adjust medications for blood pressure control, will hold verapamil since she is currently on IV Cardizem     Anticoagulation - chance to ask is elevated, will start heparin, likely will need oral anticoagulation, but no NOAC till CAD is assessed.   SignedRosaria Ferries, PA-C 07/22/2013 8:37 PM Beeper [phone]  Co-Sign MD

## 2013-07-22 NOTE — Progress Notes (Signed)
Peak Flow measurement - 220 lpm ( best of 3 attempts), Sat 98% on room air. No wheeze, rales, rhonchi - BBS's clear.

## 2013-07-22 NOTE — ED Provider Notes (Signed)
CSN: [id number]     Arrival date & time 07/22/13  1633 History   First MD Initiated Contact with Patient 07/22/13 1657     Chief Complaint  Patient presents with  . Shortness of Breath     (Consider location/radiation/quality/duration/timing/severity/associated sxs/prior Treatment) HPI Comments: Patient presents to the ER for evaluation of shortness of breath. Patient reports that she woke up today feeling like she was more short unusual. She is not expressing any chest pain. She has had some swelling of her legs for approximately a week or more. She was seen by her doctor and told that she might have On her feeds and started on prednisone. This did not help the pain in her feet worse. Patient reports a history of hypertension. She also was seen by her doctor recently and told she had an "irregular heartbeat". She's not sure what the rhythm was, was started on verapamil.  Patient is a 73 y.o. female presenting with shortness of breath.  Shortness of Breath   Past Medical History  Diagnosis Date  . Hypertension   . Asthma    Past Surgical History  Procedure Laterality Date  . Back surgery     No family history on file. History  Substance Use Topics  . Smoking status: Never Smoker   . Smokeless tobacco: Not on file  . Alcohol Use: Yes     Comment: occ   OB History   Grav Para Term Preterm Abortions TAB SAB Ect Mult Living                 Review of Systems  Respiratory: Positive for shortness of breath.   All other systems reviewed and are negative.     Allergies  Review of patient's allergies indicates no known allergies.  Home Medications   Prior to Admission medications   Medication Sig Start Date End Date Taking? Authorizing Provider  lisinopril (PRINIVIL,ZESTRIL) 40 MG tablet Take 80 mg by mouth every morning.   Yes Historical Provider, MD  naproxen sodium (ANAPROX) 220 MG tablet Take 220 mg by mouth 2 (two) times daily as needed (pain).   Yes Historical  Provider, MD  pramipexole (MIRAPEX) 1 MG tablet Take 2 mg by mouth every morning.   Yes Historical Provider, MD  triamterene-hydrochlorothiazide (DYAZIDE) 37.5-25 MG per capsule Take 1 capsule by mouth every morning.   Yes Historical Provider, MD  verapamil (CALAN-SR) 240 MG CR tablet Take 240 mg by mouth at bedtime.   Yes Historical Provider, MD   BP 138/88  Pulse 96  Temp(Src) 98.4 F (36.9 C) (Oral)  Resp 15  SpO2 96% Physical Exam  Constitutional: She is oriented to person, place, and time. She appears well-developed and well-nourished. No distress.  HENT:  Head: Normocephalic and atraumatic.  Right Ear: Hearing normal.  Left Ear: Hearing normal.  Nose: Nose normal.  Mouth/Throat: Oropharynx is clear and moist and mucous membranes are normal.  Eyes: Conjunctivae and EOM are normal. Pupils are equal, round, and reactive to light.  Neck: Normal range of motion. Neck supple.  Cardiovascular: S1 normal and S2 normal.  An irregularly irregular rhythm present. Tachycardia present.  Exam reveals no gallop and no friction rub.   No murmur heard. Pulmonary/Chest: Effort normal and breath sounds normal. No respiratory distress. She exhibits no tenderness.  Abdominal: Soft. Normal appearance and bowel sounds are normal. There is no hepatosplenomegaly. There is no tenderness. There is no rebound, no guarding, no tenderness at McBurney's point and negative Murphy's sign.  No hernia.  Musculoskeletal: Normal range of motion.  Neurological: She is alert and oriented to person, place, and time. She has normal strength. No cranial nerve deficit or sensory deficit. Coordination normal. GCS eye subscore is 4. GCS verbal subscore is 5. GCS motor subscore is 6.  Skin: Skin is warm, dry and intact. No rash noted. No cyanosis.  Psychiatric: She has a normal mood and affect. Her speech is normal and behavior is normal. Thought content normal.    ED Course  Procedures (including critical care time) Labs  Review Labs Reviewed  CBC - Abnormal; Notable for the following:    WBC 15.4 (*)    All other components within normal limits  COMPREHENSIVE METABOLIC PANEL - Abnormal; Notable for the following:    Sodium 136 (*)    BUN 26 (*)    GFR calc non Af Amer 67 (*)    GFR calc Af Amer 77 (*)    Anion gap 16 (*)    All other components within normal limits  PRO B NATRIURETIC PEPTIDE - Abnormal; Notable for the following:    Pro B Natriuretic peptide (BNP) 2246.0 (*)    All other components within normal limits  I-STAT TROPOININ, ED    Imaging Review Dg Chest 2 View  07/22/2013   CLINICAL DATA:  Shortness of breath, history hypertension, asthma  EXAM: CHEST  2 VIEW  COMPARISON:  None.  FINDINGS: Enlargement of cardiac silhouette with pulmonary vascular congestion.  Calcified tortuous thoracic aorta.  Bronchitic changes with minimal RIGHT basilar atelectasis.  No acute infiltrate, pleural effusion, or pneumothorax.  Multiple old appearing BILATERAL rib fractures.  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  IMPRESSION: Enlargement of cardiac silhouette with pulmonary vascular congestion.  Chronic bronchitic changes.  No acute infiltrate.  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   Electronically Signed   By: Lavonia Dana M.D.   On: 07/22/2013 17:46     EKG Interpretation   Date/Time:  Monday July 22 2013 16:48:38 EDT Ventricular Rate:  114 PR Interval:    QRS Duration: 87 QT Interval:  335 QTC Calculation: 461 R Axis:   95 Text Interpretation:  Atrial fibrillation Right axis deviation Low  voltage, precordial leads new onset afib Confirmed by POLLINA  MD,  CHRISTOPHER  [phone]) on 07/22/2013 6:18:35 PM      MDM   Final diagnoses:  None   atrial fibrillation with rapid ventricular response  CHF   Patient presents to the ER for evaluation of shortness of breath. Symptoms began upon awakening this morning. She does have a more complex recent history of palpitations that occur intermittently. Her doctor put her on Verapamil SR this. She also has been experiencing swelling of her legs. She is being treated for possible gout in her feet because of pain in the feet, but does have pitting edema bilaterally.  Patient to be in atrial fibrillation with rapid ventricular response upon arrival to the ER. This was treated with Cardizem bolus and drip and she has improved. Heart rate now in the 90s and she is symptomatically improved with her breathing. Her workup, however, is concerning for congestive heart failure. Cardiology has been consulted and will see the patient in the ER for further management.    Orpah Greek,  MD 07/22/13 1910

## 2013-07-23 DIAGNOSIS — I517 Cardiomegaly: Secondary | ICD-10-CM

## 2013-07-23 LAB — PROTIME-INR
INR: 0.96 (ref 0.00–1.49)
Prothrombin Time: 12.8 seconds (ref 11.6–15.2)

## 2013-07-23 LAB — HEMOGLOBIN A1C
Hgb A1c MFr Bld: 6 % — ABNORMAL HIGH (ref <5.7)
Mean Plasma Glucose: 126 mg/dL — ABNORMAL HIGH (ref <117)

## 2013-07-23 LAB — MRSA PCR SCREENING: MRSA by PCR: NEGATIVE

## 2013-07-23 LAB — BASIC METABOLIC PANEL
ANION GAP: 13 (ref 5–15)
BUN: 23 mg/dL (ref 6–23)
CALCIUM: 8.8 mg/dL (ref 8.4–10.5)
CHLORIDE: 100 meq/L (ref 96–112)
CO2: 30 meq/L (ref 19–32)
CREATININE: 0.79 mg/dL (ref 0.50–1.10)
GFR calc non Af Amer: 80 mL/min — ABNORMAL LOW (ref >90)
Glucose, Bld: 113 mg/dL — ABNORMAL HIGH (ref 70–99)
Potassium: 2.9 mEq/L — CL (ref 3.7–5.3)
SODIUM: 143 meq/L (ref 137–147)

## 2013-07-23 LAB — HEPARIN LEVEL (UNFRACTIONATED)
HEPARIN UNFRACTIONATED: 0.21 [IU]/mL — AB (ref 0.30–0.70)
Heparin Unfractionated: 0.13 IU/mL — ABNORMAL LOW (ref 0.30–0.70)

## 2013-07-23 LAB — TROPONIN I
Troponin I: <0.3 ng/mL (ref <0.30)
Troponin I: <0.3 ng/mL (ref <0.30)

## 2013-07-23 LAB — TSH: TSH: 2.98 u[IU]/mL (ref 0.350–4.500)

## 2013-07-23 LAB — APTT: aPTT: 23 seconds — ABNORMAL LOW (ref 24–37)

## 2013-07-23 MED ORDER — HEPARIN BOLUS VIA INFUSION
1000.0000 [IU] | Freq: Once | INTRAVENOUS | Status: AC
  Administered 2013-07-23: 1000 [IU] via INTRAVENOUS
  Filled 2013-07-23: qty 1000

## 2013-07-23 MED ORDER — DILTIAZEM HCL ER COATED BEADS 180 MG PO CP24
180.0000 mg | ORAL_CAPSULE | Freq: Every day | ORAL | Status: DC
  Administered 2013-07-23 – 2013-07-24 (×2): 180 mg via ORAL
  Filled 2013-07-23 (×4): qty 1

## 2013-07-23 MED ORDER — POTASSIUM CHLORIDE CRYS ER 20 MEQ PO TBCR
40.0000 meq | EXTENDED_RELEASE_TABLET | Freq: Once | ORAL | Status: AC
  Administered 2013-07-23: 40 meq via ORAL
  Filled 2013-07-23: qty 2

## 2013-07-23 MED ORDER — HEPARIN BOLUS VIA INFUSION
3000.0000 [IU] | Freq: Once | INTRAVENOUS | Status: AC
  Administered 2013-07-23: 3000 [IU] via INTRAVENOUS
  Filled 2013-07-23: qty 3000

## 2013-07-23 MED ORDER — POTASSIUM CHLORIDE CRYS ER 20 MEQ PO TBCR
20.0000 meq | EXTENDED_RELEASE_TABLET | Freq: Every day | ORAL | Status: DC
  Administered 2013-07-24 – 2013-07-28 (×5): 20 meq via ORAL
  Filled 2013-07-23 (×6): qty 1

## 2013-07-23 MED ORDER — HEPARIN (PORCINE) IN NACL 100-0.45 UNIT/ML-% IJ SOLN
1200.0000 [IU]/h | INTRAMUSCULAR | Status: AC
  Administered 2013-07-23: 800 [IU]/h via INTRAVENOUS
  Administered 2013-07-23: 1150 [IU]/h via INTRAVENOUS
  Filled 2013-07-23 (×3): qty 250

## 2013-07-23 NOTE — Progress Notes (Signed)
CRITICAL VALUE ALERT  Critical value received:  K 2.9  Date of notification:  07/23/13  Time of notification:  0735  Critical value read back:Yes.    Nurse who received alert:  Avon Gully Rn  MD notified (1st page):  PA paged, Called Dr. Louellen Molder  Time of first page:  [phone]  MD notified (2nd page): Spoke to Dr. Gwenlyn Found in person  Time of second page: 0750  Responding MD:  Dr. Gwenlyn Found  Time MD responded:  [phone]

## 2013-07-23 NOTE — Progress Notes (Signed)
Echocardiogram 2D Echocardiogram has been performed.  Cristina [name] 07/23/2013, 9:38 AM

## 2013-07-23 NOTE — Care Management Note (Unsigned)
    Page 1 of 1   07/23/2013     2:25:34 PM CARE MANAGEMENT NOTE 07/23/2013  Patient:  Cristina [name], Cristina [name]   Account Number:  [id number]  Date Initiated:  07/23/2013  Documentation initiated by:  [facility]  Subjective/Objective Assessment:   72 year old female admitted with [name]-fib.     Action/Plan:   From home.   Anticipated DC Date:  07/26/2013   Anticipated DC Plan:  [place]  CM consult      Choice offered to / List presented to:             Status of service:  In process, will continue to follow Medicare Important Message given?   (If response is "NO", the following Medicare IM given date fields will be blank) Date Medicare IM given:   Medicare IM given by:   Date Additional Medicare IM given:   Additional Medicare IM given by:    Discharge Disposition:    Per UR Regulation:  Reviewed for med. necessity/level of care/duration of stay  If discussed at [place] of Stay Meetings, dates discussed:    Comments:

## 2013-07-23 NOTE — Progress Notes (Signed)
ANTICOAGULATION CONSULT NOTE - Follow Up Consult  Pharmacy Consult for heparin Indication: atrial fibrillation  No Known Allergies  Patient Measurements: Height: 5\' 4"  (162.6 cm) Weight: 175 lb 9.6 oz (79.652 kg) IBW/kg (Calculated) : 54.7 Heparin Dosing Weight: 63kg  Vital Signs: Temp: 98.2 F (36.8 C) (07/07 1200) Temp src: Oral (07/07 1200) BP: 137/69 mmHg (07/07 1800) Pulse Rate: 90 (07/07 1800)  Labs:  Recent Labs  07/22/13 1651 07/23/13 0037 07/23/13 0622 07/23/13 0900 07/23/13 1230 07/23/13 1859  HGB 12.5  --   --   --   --   --   HCT 38.1  --   --   --   --   --   PLT 381  --   --   --   --   --   APTT  --  23*  --   --   --   --   LABPROT  --  12.8  --   --   --   --   INR  --  0.96  --   --   --   --   HEPARINUNFRC  --   --   --  0.13*  --  0.21*  CREATININE 0.84  --  0.79  --   --   --   TROPONINI  --  <0.30 <0.30  --  <0.30  --    Estimated Creatinine Clearance: 63 ml/min (by C-G formula based on Cr of 0.79).  Assessment: 81 YOF presents with SOB, admitted with Afib and RVR.  Heparin started per pharmacy consult.   First heparin level is subtherapeutic at 0.13-rate was increased from 800 to 1000 units/hr  Second heparin level is subtherapeutic at 0.21 units/ml  CBC: Hgb and Pltc WNL last evening  Plans for PO anticoagulation per cardiology (CHADS2Vasc Score = 2)  IF Xarelto chosen, was on Verapamil prior to admission which can increase xarelto levels if resumed  Goal of Therapy:  Heparin level 0.3-0.7 units/ml Monitor platelets by anticoagulation protocol: Yes   Plan:   Heparin level subtherapeutic x 2 levels  Heparin bolus of 1000 units/hr, increase rate to 1150 units/hr  Check heparin level in 8hrs  Daily heparin level and CBC  Await orders for longer-term anticoagulation  Minda Ditto PharmD Pager [phone] 07/23/2013, 8:07 PM

## 2013-07-23 NOTE — Progress Notes (Signed)
ANTICOAGULATION CONSULT NOTE - Follow Up Consult  Pharmacy Consult for heparin Indication: atrial fibrillation  No Known Allergies  Patient Measurements: Height: 5\' 4"  (162.6 cm) Weight: 175 lb 9.6 oz (79.652 kg) IBW/kg (Calculated) : 54.7 Heparin Dosing Weight: 63kg  Vital Signs: Temp: 98.9 F (37.2 C) (07/07 0800) Temp src: Oral (07/07 0800) BP: 131/66 mmHg (07/07 0947) Pulse Rate: 100 (07/07 0947)  Labs:  Recent Labs  07/22/13 1651 07/23/13 0037 07/23/13 0622 07/23/13 0900  HGB 12.5  --   --   --   HCT 38.1  --   --   --   PLT 381  --   --   --   APTT  --  23*  --   --   LABPROT  --  12.8  --   --   INR  --  0.96  --   --   HEPARINUNFRC  --   --   --  0.13*  CREATININE 0.84  --  0.79  --   TROPONINI  --  <0.30 <0.30  --     Estimated Creatinine Clearance: 63 ml/min (by C-G formula based on Cr of 0.79).   Assessment: 54 YOF presents with SOB, admitted with Afib and RVR.  Heparin started per pharmacy consult.   First heparin level is subtherapeutic at 0.13  CBC: Hgb and Pltc WNL last evening  Plans for PO anticoagulation per cardiology (CHADS2Vasc Score = 2)  IF Xarelto chosen, was on Verapamil prior to admission which can increase xarelto levels if resumed  Goal of Therapy:  Heparin level 0.3-0.7 units/ml Monitor platelets by anticoagulation protocol: Yes   Plan:   Heparin level subtherapeutic. Increase heparin to 1000 units/hr  Check heparin level in 8hrs  Daily heparin level and CBC  Await orders for longer-term anticoagulation  Doreene Eland, PharmD, BCPS.   Pager: [phone]  07/23/2013,10:14 AM

## 2013-07-23 NOTE — Progress Notes (Signed)
Pt declines foley catheter at this time.  Will reassess in AM.

## 2013-07-23 NOTE — Progress Notes (Signed)
ANTICOAGULATION CONSULT NOTE - Initial Consult  Pharmacy Consult for IV Hepairn Indication: ACS  No Known Allergies  Patient Measurements: Height: 5\' 4"  (162.6 cm) Weight: 180 lb (81.647 kg) IBW/kg (Calculated) : 54.7 Heparin Dosing Weight: 63 kg  Vital Signs: Temp: 97.8 F (36.6 C) (07/06 2345) Temp src: Oral (07/06 2345) BP: 156/94 mmHg (07/06 2345) Pulse Rate: 99 (07/06 2345)  Labs:  Recent Labs  07/22/13 1651  HGB 12.5  HCT 38.1  PLT 381  CREATININE 0.84    Estimated Creatinine Clearance: 60.8 ml/min (by C-G formula based on Cr of 0.84).   Medical History: Past Medical History  Diagnosis Date  . Hypertension   . Asthma     Medications:  Scheduled:  . furosemide  40 mg Intravenous BID  . heparin  3,000 Units Intravenous Once  . lisinopril  40 mg Oral q morning - 10a  . pramipexole  2 mg Oral q morning - 10a   Infusions:  . diltiazem (CARDIZEM) infusion 5 mg/hr (07/23/13 0000)  . heparin      Assessment: 73 yo with hx asthma and HTN c/o SOB in new onset A-fib. IV Heparin per Rx for ACS.  Goal of Therapy:  Heparin level 0.3-0.7 units/ml Monitor platelets by anticoagulation protocol: Yes   Plan:   Baseline coags/ht/wt stat  Heparin 3000 unit bolus x1  Start drip @ 800 units/hr  Daily CBC/HL  Check 1st HL in 8 hours  Dorrene German 07/23/2013,12:12 AM

## 2013-07-23 NOTE — Progress Notes (Signed)
Subjective:  Pt clinically improved. Less SOB  Objective:  Temp:  [97.5 F (36.4 C)-98.4 F (36.9 C)] 97.5 F (36.4 C) (07/07 0400) Pulse Rate:  [78-114] 92 (07/07 0800) Resp:  [8-24] 14 (07/07 0800) BP: (115-156)/(57-111) 131/73 mmHg (07/07 0800) SpO2:  [93 %-100 %] 95 % (07/07 0800) Weight:  [175 lb 9.6 oz (79.652 kg)-180 lb (81.647 kg)] 175 lb 9.6 oz (79.652 kg) (07/07 0600) Weight change:   Intake/Output from previous day: 07/06 0701 - 07/07 0700 In: 85.8 [I.V.:85.8] Out: 2775 [Urine:2775]  Intake/Output from this shift:    Physical Exam: General appearance: alert and no distress Neck: no adenopathy, no carotid bruit, no JVD, supple, symmetrical, trachea midline and thyroid not enlarged, symmetric, no tenderness/mass/nodules Lungs: clear to auscultation bilaterally Heart: irregularly irregular rhythm Extremities: extremities normal, atraumatic, no cyanosis or edema  Lab Results: Results for orders placed during the hospital encounter of 07/22/13 (from the past 48 hour(s))  CBC     Status: Abnormal   Collection Time    07/22/13  4:51 PM      Result Value Ref Range   WBC 15.4 (*) 4.0 - 10.5 K/uL   RBC 4.32  3.87 - 5.11 MIL/uL   Hemoglobin 12.5  12.0 - 15.0 g/dL   HCT 38.1  36.0 - 46.0 %   MCV 88.2  78.0 - 100.0 fL   MCH 28.9  26.0 - 34.0 pg   MCHC 32.8  30.0 - 36.0 g/dL   RDW 13.8  11.5 - 15.5 %   Platelets 381  150 - 400 K/uL  COMPREHENSIVE METABOLIC PANEL     Status: Abnormal   Collection Time    07/22/13  4:51 PM      Result Value Ref Range   Sodium 136 (*) 137 - 147 mEq/L   Potassium 3.8  3.7 - 5.3 mEq/L   Chloride 97  96 - 112 mEq/L   CO2 23  19 - 32 mEq/L   Glucose, Bld 97  70 - 99 mg/dL   BUN 26 (*) 6 - 23 mg/dL   Creatinine, Ser 0.84  0.50 - 1.10 mg/dL   Calcium 9.4  8.4 - 10.5 mg/dL   Total Protein 6.4  6.0 - 8.3 g/dL   Albumin 3.7  3.5 - 5.2 g/dL   AST 13  0 - 37 U/L   ALT 18  0 - 35 U/L   Alkaline Phosphatase 117  39 - 117 U/L   Total Bilirubin 0.4  0.3 - 1.2 mg/dL   GFR calc non Af Amer 67 (*) >90 mL/min   GFR calc Af Amer 77 (*) >90 mL/min   Comment: (NOTE)     The eGFR has been calculated using the CKD EPI equation.     This calculation has not been validated in all clinical situations.     eGFR's persistently <90 mL/min signify possible Chronic Kidney     Disease.   Anion gap 16 (*) 5 - 15  PRO B NATRIURETIC PEPTIDE     Status: Abnormal   Collection Time    07/22/13  5:10 PM      Result Value Ref Range   Pro B Natriuretic peptide (BNP) 2246.0 (*) 0 - 125 pg/mL  I-STAT TROPOININ, ED     Status: None   Collection Time    07/22/13  5:16 PM      Result Value Ref Range   Troponin i, poc 0.00  0.00 - 0.08 ng/mL  Comment 3            Comment: Due to the release kinetics of cTnI,     a negative result within the first hours     of the onset of symptoms does not rule out     myocardial infarction with certainty.     If myocardial infarction is still suspected,     repeat the test at appropriate intervals.  MRSA PCR SCREENING     Status: None   Collection Time    07/22/13 11:44 PM      Result Value Ref Range   MRSA by PCR NEGATIVE  NEGATIVE   Comment:            The GeneXpert MRSA Assay (FDA     approved for NASAL specimens     only), is one component of a     comprehensive MRSA colonization     surveillance program. It is not     intended to diagnose MRSA     infection nor to guide or     monitor treatment for     MRSA infections.  TROPONIN I     Status: None   Collection Time    07/23/13 12:37 AM      Result Value Ref Range   Troponin I <0.30  <0.30 ng/mL   Comment:            Due to the release kinetics of cTnI,     a negative result within the first hours     of the onset of symptoms does not rule out     myocardial infarction with certainty.     If myocardial infarction is still suspected,     repeat the test at appropriate intervals.  PROTIME-INR     Status: None   Collection Time     07/23/13 12:37 AM      Result Value Ref Range   Prothrombin Time 12.8  11.6 - 15.2 seconds   INR 0.96  0.00 - 1.49  TSH     Status: None   Collection Time    07/23/13 12:37 AM      Result Value Ref Range   TSH 2.980  0.350 - 4.500 uIU/mL   Comment: Performed at [facility]  APTT     Status: Abnormal   Collection Time    07/23/13 12:37 AM      Result Value Ref Range   aPTT 23 (*) 24 - 37 seconds  TROPONIN I     Status: None   Collection Time    07/23/13  6:22 AM      Result Value Ref Range   Troponin I <0.30  <0.30 ng/mL   Comment:            Due to the release kinetics of cTnI,     a negative result within the first hours     of the onset of symptoms does not rule out     myocardial infarction with certainty.     If myocardial infarction is still suspected,     repeat the test at appropriate intervals.  BASIC METABOLIC PANEL     Status: Abnormal   Collection Time    07/23/13  6:22 AM      Result Value Ref Range   Sodium 143  137 - 147 mEq/L   Comment: DELTA CHECK NOTED     REPEATED TO VERIFY   Potassium 2.9 (*) 3.7 - 5.3 mEq/L  Comment: DELTA CHECK NOTED     REPEATED TO VERIFY     CRITICAL RESULT CALLED TO, READ BACK BY AND VERIFIED WITH:     KOONTZ,A. RN AT 3734 07/23/13 BARFIELD,T   Chloride 100  96 - 112 mEq/L   CO2 30  19 - 32 mEq/L   Glucose, Bld 113 (*) 70 - 99 mg/dL   BUN 23  6 - 23 mg/dL   Creatinine, Ser 0.79  0.50 - 1.10 mg/dL   Calcium 8.8  8.4 - 10.5 mg/dL   GFR calc non Af Amer 80 (*) >90 mL/min   GFR calc Af Amer >90  >90 mL/min   Comment: (NOTE)     The eGFR has been calculated using the CKD EPI equation.     This calculation has not been validated in all clinical situations.     eGFR's persistently <90 mL/min signify possible Chronic Kidney     Disease.   Anion gap 13  5 - 15    Imaging: Imaging results have been reviewed  Assessment/Plan:   1. Principal Problem: 2.   CHF (congestive heart failure) 3. Active Problems: 4.   Atrial  fibrillation, rapid 5.   Hypertension 6.   Atrial fibrillation with rapid ventricular response 7.   Time Spent Directly with Patient:  20 minutes  Length of Stay:  LOS: 1 day   Pt admitted yesterday with AFIB with RVR, increased SOB and evidence of volume overload with increased peripheral edema, pulm vasc congestion on CXR and elevated BNP. She was placed on IV dilt, hep and lasix with good diuresis. HR better controlled. K+ 2.9----> replete and start daily kdur. Will D/C IV dilt and change to PO. Get 2D for LV fxn. Cont iv lasix today and transition to PO tomorrow. Will need to be on oral anticoagulation prior to DC . Will get pharmacy to assist with this.  Lorretta Harp 07/23/2013, 8:35 AM

## 2013-07-24 ENCOUNTER — Encounter (HOSPITAL_COMMUNITY): Payer: Self-pay | Admitting: Cardiology

## 2013-07-24 ENCOUNTER — Telehealth: Payer: Self-pay

## 2013-07-24 ENCOUNTER — Inpatient Hospital Stay (HOSPITAL_COMMUNITY): Payer: Medicare Other

## 2013-07-24 DIAGNOSIS — M25469 Effusion, unspecified knee: Secondary | ICD-10-CM

## 2013-07-24 DIAGNOSIS — I509 Heart failure, unspecified: Secondary | ICD-10-CM

## 2013-07-24 DIAGNOSIS — I4891 Unspecified atrial fibrillation: Principal | ICD-10-CM

## 2013-07-24 DIAGNOSIS — I5031 Acute diastolic (congestive) heart failure: Secondary | ICD-10-CM

## 2013-07-24 DIAGNOSIS — R9430 Abnormal result of cardiovascular function study, unspecified: Secondary | ICD-10-CM | POA: Diagnosis present

## 2013-07-24 DIAGNOSIS — Z7901 Long term (current) use of anticoagulants: Secondary | ICD-10-CM

## 2013-07-24 LAB — BASIC METABOLIC PANEL
Anion gap: 13 (ref 5–15)
BUN: 23 mg/dL (ref 6–23)
CO2: 30 meq/L (ref 19–32)
CREATININE: 0.82 mg/dL (ref 0.50–1.10)
Calcium: 9.6 mg/dL (ref 8.4–10.5)
Chloride: 97 mEq/L (ref 96–112)
GFR calc non Af Amer: 69 mL/min — ABNORMAL LOW (ref >90)
GFR, EST AFRICAN AMERICAN: 80 mL/min — AB (ref >90)
Glucose, Bld: 125 mg/dL — ABNORMAL HIGH (ref 70–99)
Potassium: 3.4 mEq/L — ABNORMAL LOW (ref 3.7–5.3)
Sodium: 140 mEq/L (ref 137–147)

## 2013-07-24 LAB — CBC
HCT: 43.1 % (ref 36.0–46.0)
HEMOGLOBIN: 14.2 g/dL (ref 12.0–15.0)
MCH: 29.3 pg (ref 26.0–34.0)
MCHC: 32.9 g/dL (ref 30.0–36.0)
MCV: 89 fL (ref 78.0–100.0)
Platelets: 392 10*3/uL (ref 150–400)
RBC: 4.84 MIL/uL (ref 3.87–5.11)
RDW: 13.9 % (ref 11.5–15.5)
WBC: 17.1 10*3/uL — ABNORMAL HIGH (ref 4.0–10.5)

## 2013-07-24 LAB — HEPARIN LEVEL (UNFRACTIONATED): Heparin Unfractionated: 0.37 IU/mL (ref 0.30–0.70)

## 2013-07-24 LAB — PRO B NATRIURETIC PEPTIDE: Pro B Natriuretic peptide (BNP): 1013 pg/mL — ABNORMAL HIGH (ref 0–125)

## 2013-07-24 MED ORDER — MORPHINE SULFATE 2 MG/ML IJ SOLN
2.0000 mg | INTRAMUSCULAR | Status: DC | PRN
  Administered 2013-07-24 – 2013-07-25 (×5): 2 mg via INTRAVENOUS
  Filled 2013-07-24 (×5): qty 1

## 2013-07-24 MED ORDER — RIVAROXABAN 20 MG PO TABS
20.0000 mg | ORAL_TABLET | Freq: Every day | ORAL | Status: DC
  Administered 2013-07-24: 20 mg via ORAL
  Filled 2013-07-24 (×2): qty 1

## 2013-07-24 MED ORDER — SODIUM CHLORIDE 0.9 % IV BOLUS (SEPSIS)
250.0000 mL | Freq: Once | INTRAVENOUS | Status: AC
  Administered 2013-07-24: 250 mL via INTRAVENOUS

## 2013-07-24 MED ORDER — MORPHINE SULFATE 2 MG/ML IJ SOLN
INTRAMUSCULAR | Status: AC
  Administered 2013-07-24: 2 mg via INTRAVENOUS
  Filled 2013-07-24: qty 1

## 2013-07-24 MED ORDER — FUROSEMIDE 20 MG PO TABS
20.0000 mg | ORAL_TABLET | Freq: Every day | ORAL | Status: DC
  Administered 2013-07-24 – 2013-07-28 (×5): 20 mg via ORAL
  Filled 2013-07-24 (×6): qty 1

## 2013-07-24 NOTE — Consult Note (Signed)
[place] Consultation  LAKIN RHINE RWE:[id number] DOB: 03/23/1940 DOA: 07/22/2013 PCP: Tawanna Solo, MD   Requesting physician: Dr. Quay Burow (Cardiology)  Date of consultation: 07/24/2013 Reason for consultation: right knee swelling   Impression/Recommendations Principal Problem:   CHF (congestive heart failure) Active Problems:   Atrial fibrillation, rapid   Hypertension   Atrial fibrillation with rapid ventricular response   HPI:  73 y.o. female with past medical history of, hypertension who presented to [facility] ED 07/22/2013 for evaluation of irregular heart rhythm as detected by patient's primary care physician. Patient reported on admission having lower extremity swelling and weight gain. Patient was found to have atrial fibrillation with rapid ventricular rate, she had evidence of volume overload with increased peripheral edema and pulmonary vascular congestion on chest x-ray as well as elevated BNP. She required IV diltiazem, heparin and Lasix. Overall she has had good diuresis. Her hospital course is complicated due to acute right knee swelling. Cardiology consulted [place] for input on management.  Assessment and plan:  Principal problem: Right knee swelling - Obtain right knee x-ray for further evaluation. May require orthopedic consult for knee aspiration. We'll followup on results of x-rays  Active problems: Atrial fibrillation / Acute diastolic CHF - Management per primary team, cardiology  Will follow.  Leisa Lenz [facility] [phone] or [phone]   Review of Systems:  Constitutional: Negative for fever, chills, diaphoresis, activity change, appetite change and fatigue.  HENT: Negative for ear pain, nosebleeds, congestion, facial swelling, rhinorrhea, neck pain, neck stiffness and ear discharge.   Eyes: Negative for pain, discharge, redness, itching and visual disturbance.  Respiratory: Negative for cough, choking, chest tightness, shortness of  breath, wheezing and stridor.   Cardiovascular: Negative for chest pain, palpitations and leg swelling.  Gastrointestinal: Negative for abdominal distention.  Genitourinary: Negative for dysuria, urgency, frequency, hematuria, flank pain, decreased urine volume, difficulty urinating and dyspareunia.  Musculoskeletal: positive for right knee swelling..  Neurological: Negative for dizziness, tremors, seizures, syncope, facial asymmetry, speech difficulty, weakness, light-headedness, numbness and headaches.  Hematological: Negative for adenopathy. Does not bruise/bleed easily.  Psychiatric/Behavioral: Negative for hallucinations, behavioral problems, confusion, dysphoric mood, decreased concentration and agitation.     Past Medical History  Diagnosis Date  . Hypertension   . Asthma    Past Surgical History  Procedure Laterality Date  . Back surgery     Social History:  reports that she has never smoked. She does not have any smokeless tobacco history on file. She reports that she drinks alcohol. Her drug history is not on file.  No Known Allergies No family history on file.  Prior to Admission medications   Medication Sig Start Date End Date Taking? Authorizing Provider  lisinopril (PRINIVIL,ZESTRIL) 40 MG tablet Take 80 mg by mouth every morning.   Yes Historical Provider, MD  naproxen sodium (ANAPROX) 220 MG tablet Take 220 mg by mouth 2 (two) times daily as needed (pain).   Yes Historical Provider, MD  pramipexole (MIRAPEX) 1 MG tablet Take 2 mg by mouth every morning.   Yes Historical Provider, MD  triamterene-hydrochlorothiazide (DYAZIDE) 37.5-25 MG per capsule Take 1 capsule by mouth every morning.   Yes Historical Provider, MD  verapamil (CALAN-SR) 240 MG CR tablet Take 240 mg by mouth at bedtime.   Yes Historical Provider, MD   Physical Exam: Blood pressure 128/75, pulse 112, temperature 98.1 F (36.7 C), temperature source Oral, resp. rate 16, height 5\' 4"  (1.626 m), weight  76.114 kg (167 lb 12.8 oz),  SpO2 90.00%. Filed Vitals:   07/24/13 0941  BP: 128/75  Pulse: 112  Temp: 98.1 F (36.7 C)  Resp: 16   Physical Exam  Constitutional: Appears well-developed and well-nourished. No distress.  HENT: Normocephalic. External right and left ear normal. Oropharynx is clear and moist.  Eyes: Conjunctivae and EOM are normal. PERRLA, no scleral icterus.  Neck: Normal ROM. Neck supple. No JVD. No tracheal deviation. No thyromegaly.  CVS: irregular rhythm, S1/S2 appreciated  Pulmonary: Effort and breath sounds normal, no stridor, rhonchi, wheezes, rales.  Abdominal: Soft. BS +,  no distension, tenderness, rebound or guarding.  Musculoskeletal: right knee swelling and tenderness to palpation, no erythema.  Lymphadenopathy: No lymphadenopathy noted, cervical, inguinal. Neuro: Alert. Normal reflexes, muscle tone coordination. No cranial nerve deficit. Skin: Skin is warm and dry. No rash noted. Not diaphoretic. No erythema. No pallor.  Psychiatric: Normal mood and affect. Behavior, judgment, thought content normal.     Labs on Admission:  Basic Metabolic Panel:  Recent Labs Lab 07/22/13 1651 07/23/13 0622 07/24/13 0217  NA 136* 143 140  K 3.8 2.9* 3.4*  CL 97 100 97  CO2 23 30 30   GLUCOSE 97 113* 125*  BUN 26* 23 23  CREATININE 0.84 0.79 0.82  CALCIUM 9.4 8.8 9.6   Liver Function Tests:  Recent Labs Lab 07/22/13 1651  AST 13  ALT 18  ALKPHOS 117  BILITOT 0.4  PROT 6.4  ALBUMIN 3.7   No results found for this basename: LIPASE, AMYLASE,  in the last 168 hours No results found for this basename: AMMONIA,  in the last 168 hours CBC:  Recent Labs Lab 07/22/13 1651 07/24/13 0217  WBC 15.4* 17.1*  HGB 12.5 14.2  HCT 38.1 43.1  MCV 88.2 89.0  PLT 381 392   Cardiac Enzymes:  Recent Labs Lab 07/23/13 0037 07/23/13 0622 07/23/13 1230  TROPONINI <0.30 <0.30 <0.30   BNP: No components found with this basename: POCBNP,  CBG: No results  found for this basename: GLUCAP,  in the last 168 hours  Radiological Exams on Admission: Dg Chest 2 View 07/22/2013    IMPRESSION: Enlargement of cardiac silhouette with pulmonary vascular congestion.  Chronic bronchitic changes.  No acute infiltrate.  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     Time spent: 84 minutes  Leisa Lenz [facility] Pager [phone]  If 7PM-7AM, please contact night-coverage [email or web address] Password [email or web address] 07/24/2013, 11:14 AM

## 2013-07-24 NOTE — Progress Notes (Signed)
ANTICOAGULATION CONSULT NOTE - Follow Up Consult  Pharmacy Consult for heparin Indication: atrial fibrillation  No Known Allergies  Patient Measurements: Height: 5\' 4"  (162.6 cm) Weight: 175 lb 9.6 oz (79.652 kg) IBW/kg (Calculated) : 54.7 Heparin Dosing Weight: 63kg  Vital Signs: Temp: 98.4 F (36.9 C) (07/08 0000) Temp src: Oral (07/08 0000) BP: 138/65 mmHg (07/08 0400) Pulse Rate: 93 (07/08 0400)  Labs:  Recent Labs  07/22/13 1651 07/23/13 0037 07/23/13 0622 07/23/13 0900 07/23/13 1230 07/23/13 1859 07/24/13 0217  HGB 12.5  --   --   --   --   --  14.2  HCT 38.1  --   --   --   --   --  43.1  PLT 381  --   --   --   --   --  392  APTT  --  23*  --   --   --   --   --   LABPROT  --  12.8  --   --   --   --   --   INR  --  0.96  --   --   --   --   --   HEPARINUNFRC  --   --   --  0.13*  --  0.21* 0.37  CREATININE 0.84  --  0.79  --   --   --  0.82  TROPONINI  --  <0.30 <0.30  --  <0.30  --   --    Estimated Creatinine Clearance: 61.5 ml/min (by C-G formula based on Cr of 0.82).  Assessment: 7 YOF presents with SOB, admitted with Afib and RVR.  Heparin started per pharmacy consult.   First heparin level is subtherapeutic at 0.13-rate was increased from 800 to 1000 units/hr  Second heparin level is subtherapeutic at 0.21 units/ml  Third HL = 0.37 (drawn @ ~ 5 hrs vs 8 hrs?)    No problems per RN.  CBC: Hgb and Pltc WNL   Plans for PO anticoagulation per cardiology (CHADS2Vasc Score = 2)  IF Xarelto chosen, was on Verapamil prior to admission which can increase xarelto levels if resumed  Goal of Therapy:  Heparin level 0.3-0.7 units/ml Monitor platelets by anticoagulation protocol: Yes   Plan:   Increase heparin drip to 1200 units/hr  Check heparin level in 8hrs (~ 1400)  Daily heparin level and CBC  Await orders for longer-term anticoagulation  Dorrene German 07/24/2013, 5:36 AM

## 2013-07-24 NOTE — Discharge Instructions (Addendum)

## 2013-07-24 NOTE — Progress Notes (Signed)
Notified by NT that patient's BP 66/39, HR 88. Cardiologist, Dr. Gwenlyn Found notified. 250 cc bolus ordered with instructions to repeat x 1 if patient remains hypotensive. Patient in bed resting, bed alarm placed and instruction given not to get up without calling for help first. Will continue to monitor.

## 2013-07-24 NOTE — Progress Notes (Signed)
Patient received 2 NS boluses 250 cc per instruction by Dr Gwenlyn Found. BP now up to 124/72. Patient still complaining of some dizziness, but feels slightly better. Will continue to monitor.

## 2013-07-24 NOTE — Telephone Encounter (Signed)
Pt expected to be d/c'd today from hospital. I spoke with son in law and he will speak with wife and see if they want to come to [place] office to fu with Dr.Koneswaran as pt lives in [place]

## 2013-07-24 NOTE — Progress Notes (Addendum)
Subjective:  Clinically improved, no SOB  Objective:  Temp:  [97.9 F (36.6 C)-98.9 F (37.2 C)] 98.1 F (36.7 C) (07/08 0400) Pulse Rate:  [61-107] 93 (07/08 0400) Resp:  [9-28] 15 (07/08 0600) BP: (95-138)/(48-100) 129/71 mmHg (07/08 0600) SpO2:  [82 %-100 %] 92 % (07/08 0400) Weight:  [167 lb 12.8 oz (76.114 kg)] 167 lb 12.8 oz (76.114 kg) (07/08 0400) Weight change: -12 lb 3.2 oz (-5.534 kg)  Intake/Output from previous day: 07/07 0701 - 07/08 0700 In: 366.7 [P.O.:120; I.V.:246.7] Out: 1200 [Urine:1200]  Intake/Output from this shift: Total I/O In: 132.7 [I.V.:132.7] Out: 400 [Urine:400]  Physical Exam: General appearance: alert and no distress Neck: no adenopathy, no carotid bruit, no JVD, supple, symmetrical, trachea midline and thyroid not enlarged, symmetric, no tenderness/mass/nodules Lungs: clear to auscultation bilaterally Heart: irregularly irregular rhythm Extremities: extremities normal, atraumatic, no cyanosis or edema  Lab Results: Results for orders placed during the hospital encounter of 07/22/13 (from the past 48 hour(s))  CBC     Status: Abnormal   Collection Time    07/22/13  4:51 PM      Result Value Ref Range   WBC 15.4 (*) 4.0 - 10.5 K/uL   RBC 4.32  3.87 - 5.11 MIL/uL   Hemoglobin 12.5  12.0 - 15.0 g/dL   HCT 38.1  36.0 - 46.0 %   MCV 88.2  78.0 - 100.0 fL   MCH 28.9  26.0 - 34.0 pg   MCHC 32.8  30.0 - 36.0 g/dL   RDW 13.8  11.5 - 15.5 %   Platelets 381  150 - 400 K/uL  COMPREHENSIVE METABOLIC PANEL     Status: Abnormal   Collection Time    07/22/13  4:51 PM      Result Value Ref Range   Sodium 136 (*) 137 - 147 mEq/L   Potassium 3.8  3.7 - 5.3 mEq/L   Chloride 97  96 - 112 mEq/L   CO2 23  19 - 32 mEq/L   Glucose, Bld 97  70 - 99 mg/dL   BUN 26 (*) 6 - 23 mg/dL   Creatinine, Ser 0.84  0.50 - 1.10 mg/dL   Calcium 9.4  8.4 - 10.5 mg/dL   Total Protein 6.4  6.0 - 8.3 g/dL   Albumin 3.7  3.5 - 5.2 g/dL   AST 13  0 - 37 U/L   ALT 18  0 - 35 U/L   Alkaline Phosphatase 117  39 - 117 U/L   Total Bilirubin 0.4  0.3 - 1.2 mg/dL   GFR calc non Af Amer 67 (*) >90 mL/min   GFR calc Af Amer 77 (*) >90 mL/min   Comment: (NOTE)     The eGFR has been calculated using the CKD EPI equation.     This calculation has not been validated in all clinical situations.     eGFR's persistently <90 mL/min signify possible Chronic Kidney     Disease.   Anion gap 16 (*) 5 - 15  PRO B NATRIURETIC PEPTIDE     Status: Abnormal   Collection Time    07/22/13  5:10 PM      Result Value Ref Range   Pro B Natriuretic peptide (BNP) 2246.0 (*) 0 - 125 pg/mL  Randolm Idol, ED     Status: None   Collection Time    07/22/13  5:16 PM      Result Value Ref Range   Troponin i, poc  0.00  0.00 - 0.08 ng/mL   Comment 3            Comment: Due to the release kinetics of cTnI,     a negative result within the first hours     of the onset of symptoms does not rule out     myocardial infarction with certainty.     If myocardial infarction is still suspected,     repeat the test at appropriate intervals.  MRSA PCR SCREENING     Status: None   Collection Time    07/22/13 11:44 PM      Result Value Ref Range   MRSA by PCR NEGATIVE  NEGATIVE   Comment:            The GeneXpert MRSA Assay (FDA     approved for NASAL specimens     only), is one component of a     comprehensive MRSA colonization     surveillance program. It is not     intended to diagnose MRSA     infection nor to guide or     monitor treatment for     MRSA infections.  TROPONIN I     Status: None   Collection Time    07/23/13 12:37 AM      Result Value Ref Range   Troponin I <0.30  <0.30 ng/mL   Comment:            Due to the release kinetics of cTnI,     a negative result within the first hours     of the onset of symptoms does not rule out     myocardial infarction with certainty.     If myocardial infarction is still suspected,     repeat the test at appropriate  intervals.  PROTIME-INR     Status: None   Collection Time    07/23/13 12:37 AM      Result Value Ref Range   Prothrombin Time 12.8  11.6 - 15.2 seconds   INR 0.96  0.00 - 1.49  TSH     Status: None   Collection Time    07/23/13 12:37 AM      Result Value Ref Range   TSH 2.980  0.350 - 4.500 uIU/mL   Comment: Performed at [facility]     Status: Abnormal   Collection Time    07/23/13 12:37 AM      Result Value Ref Range   Hemoglobin A1C 6.0 (*) <5.7 %   Comment: (NOTE)                                                                               According to the ADA Clinical Practice Recommendations for 2011, when     HbA1c is used as a screening test:      >=6.5%   Diagnostic of Diabetes Mellitus               (if abnormal result is confirmed)     5.7-6.4%   Increased risk of developing Diabetes Mellitus     References:Diagnosis and Classification of Diabetes Mellitus,Diabetes     YIRS,8546,27(OJJKK 1):S62-S69 and  Standards of Medical Care in             Diabetes - 2011,Diabetes NIDP,8242,35 (Suppl 1):S11-S61.   Mean Plasma Glucose 126 (*) <117 mg/dL   Comment: Performed at [facility]  APTT     Status: Abnormal   Collection Time    07/23/13 12:37 AM      Result Value Ref Range   aPTT 23 (*) 24 - 37 seconds  TROPONIN I     Status: None   Collection Time    07/23/13  6:22 AM      Result Value Ref Range   Troponin I <0.30  <0.30 ng/mL   Comment:            Due to the release kinetics of cTnI,     a negative result within the first hours     of the onset of symptoms does not rule out     myocardial infarction with certainty.     If myocardial infarction is still suspected,     repeat the test at appropriate intervals.  BASIC METABOLIC PANEL     Status: Abnormal   Collection Time    07/23/13  6:22 AM      Result Value Ref Range   Sodium 143  137 - 147 mEq/L   Comment: DELTA CHECK NOTED     REPEATED TO VERIFY   Potassium 2.9 (*) 3.7 -  5.3 mEq/L   Comment: DELTA CHECK NOTED     REPEATED TO VERIFY     CRITICAL RESULT CALLED TO, READ BACK BY AND VERIFIED WITH:     KOONTZ,A. RN AT 3614 07/23/13 BARFIELD,T   Chloride 100  96 - 112 mEq/L   CO2 30  19 - 32 mEq/L   Glucose, Bld 113 (*) 70 - 99 mg/dL   BUN 23  6 - 23 mg/dL   Creatinine, Ser 0.79  0.50 - 1.10 mg/dL   Calcium 8.8  8.4 - 10.5 mg/dL   GFR calc non Af Amer 80 (*) >90 mL/min   GFR calc Af Amer >90  >90 mL/min   Comment: (NOTE)     The eGFR has been calculated using the CKD EPI equation.     This calculation has not been validated in all clinical situations.     eGFR's persistently <90 mL/min signify possible Chronic Kidney     Disease.   Anion gap 13  5 - 15  HEPARIN LEVEL (UNFRACTIONATED)     Status: Abnormal   Collection Time    07/23/13  9:00 AM      Result Value Ref Range   Heparin Unfractionated 0.13 (*) 0.30 - 0.70 IU/mL   Comment:            IF HEPARIN RESULTS ARE BELOW     EXPECTED VALUES, AND PATIENT     DOSAGE HAS BEEN CONFIRMED,     SUGGEST FOLLOW UP TESTING     OF ANTITHROMBIN III LEVELS.  TROPONIN I     Status: None   Collection Time    07/23/13 12:30 PM      Result Value Ref Range   Troponin I <0.30  <0.30 ng/mL   Comment:            Due to the release kinetics of cTnI,     a negative result within the first hours     of the onset of symptoms does not rule out     myocardial infarction with certainty.  If myocardial infarction is still suspected,     repeat the test at appropriate intervals.  HEPARIN LEVEL (UNFRACTIONATED)     Status: Abnormal   Collection Time    07/23/13  6:59 PM      Result Value Ref Range   Heparin Unfractionated 0.21 (*) 0.30 - 0.70 IU/mL   Comment:            IF HEPARIN RESULTS ARE BELOW     EXPECTED VALUES, AND PATIENT     DOSAGE HAS BEEN CONFIRMED,     SUGGEST FOLLOW UP TESTING     OF ANTITHROMBIN III LEVELS.  BASIC METABOLIC PANEL     Status: Abnormal   Collection Time    07/24/13  2:17 AM       Result Value Ref Range   Sodium 140  137 - 147 mEq/L   Potassium 3.4 (*) 3.7 - 5.3 mEq/L   Chloride 97  96 - 112 mEq/L   CO2 30  19 - 32 mEq/L   Glucose, Bld 125 (*) 70 - 99 mg/dL   BUN 23  6 - 23 mg/dL   Creatinine, Ser 0.82  0.50 - 1.10 mg/dL   Calcium 9.6  8.4 - 10.5 mg/dL   GFR calc non Af Amer 69 (*) >90 mL/min   GFR calc Af Amer 80 (*) >90 mL/min   Comment: (NOTE)     The eGFR has been calculated using the CKD EPI equation.     This calculation has not been validated in all clinical situations.     eGFR's persistently <90 mL/min signify possible Chronic Kidney     Disease.   Anion gap 13  5 - 15  HEPARIN LEVEL (UNFRACTIONATED)     Status: None   Collection Time    07/24/13  2:17 AM      Result Value Ref Range   Heparin Unfractionated 0.37  0.30 - 0.70 IU/mL   Comment:            IF HEPARIN RESULTS ARE BELOW     EXPECTED VALUES, AND PATIENT     DOSAGE HAS BEEN CONFIRMED,     SUGGEST FOLLOW UP TESTING     OF ANTITHROMBIN III LEVELS.  CBC     Status: Abnormal   Collection Time    07/24/13  2:17 AM      Result Value Ref Range   WBC 17.1 (*) 4.0 - 10.5 K/uL   RBC 4.84  3.87 - 5.11 MIL/uL   Hemoglobin 14.2  12.0 - 15.0 g/dL   HCT 43.1  36.0 - 46.0 %   MCV 89.0  78.0 - 100.0 fL   MCH 29.3  26.0 - 34.0 pg   MCHC 32.9  30.0 - 36.0 g/dL   RDW 13.9  11.5 - 15.5 %   Platelets 392  150 - 400 K/uL  PRO B NATRIURETIC PEPTIDE     Status: Abnormal   Collection Time    07/24/13  2:17 AM      Result Value Ref Range   Pro B Natriuretic peptide (BNP) 1013.0 (*) 0 - 125 pg/mL    Imaging: Imaging results have been reviewed  Assessment/Plan:   1. Principal Problem: 2.   CHF (congestive heart failure) 3. Active Problems: 4.   Atrial fibrillation, rapid 5.   Hypertension 6.   Atrial fibrillation with rapid ventricular response 7.   Time Spent Directly with Patient:  20 minutes  Length of Stay:  LOS: 2 days  Feels much better this AM. Total diuresis 3.5 liters on IV  lasix. VR controlled on oral CCB. 2D echo nl LV fxn. Exam benign. Will change IV hep to NOAC, transition to PO lasix and Tx tele. Ambulate today with intention of D/C home tomorrow. BNP decreased from 2200 to 1000.  Lorretta Harp 07/24/2013, 6:59 AM

## 2013-07-24 NOTE — Telephone Encounter (Signed)
Message copied by Bernita Raisin on Wed Jul 24, 2013  8:11 AM ------      Message from: Kate Sable A      Created: Tue Jul 23, 2013 10:07 PM       Please see if patient would like to f/u with me in [place] clinic. Thanks. I admitted her to [facility] with rapid atrial fibrillation and heart failure on 7/6. ------

## 2013-07-24 NOTE — Progress Notes (Signed)
Paged MD on call. Pt is having having leg cramps.   Given order for Morphine 2mg  Q4hrs and to check AM labs now by Dr. Clayborne Artist

## 2013-07-24 NOTE — Progress Notes (Addendum)
ANTICOAGULATION CONSULT NOTE - Follow Up Consult  Pharmacy Consult for heparin --> rivaroxaban Indication: atrial fibrillation  No Known Allergies  Patient Measurements: Height: 5\' 4"  (162.6 cm) Weight: 167 lb 12.8 oz (76.114 kg) IBW/kg (Calculated) : 54.7 Heparin Dosing Weight: 63kg  Vital Signs: Temp: 98.1 F (36.7 C) (07/08 0400) Temp src: Oral (07/08 0400) BP: 129/71 mmHg (07/08 0600) Pulse Rate: 93 (07/08 0400)  Labs:  Recent Labs  07/22/13 1651 07/23/13 0037 07/23/13 0622 07/23/13 0900 07/23/13 1230 07/23/13 1859 07/24/13 0217  HGB 12.5  --   --   --   --   --  14.2  HCT 38.1  --   --   --   --   --  43.1  PLT 381  --   --   --   --   --  392  APTT  --  23*  --   --   --   --   --   LABPROT  --  12.8  --   --   --   --   --   INR  --  0.96  --   --   --   --   --   HEPARINUNFRC  --   --   --  0.13*  --  0.21* 0.37  CREATININE 0.84  --  0.79  --   --   --  0.82  TROPONINI  --  <0.30 <0.30  --  <0.30  --   --    Estimated Creatinine Clearance: 60.1 ml/min (by C-G formula based on Cr of 0.82).  Assessment: 83 YOF presents with SOB, admitted with Afib and RVR.  Heparin started per pharmacy consult.   First heparin level is subtherapeutic at 0.13-rate was increased from 800 to 1000 units/hr  Second heparin level is subtherapeutic at 0.21 units/ml  Third HL = 0.37 (drawn @ ~ 5 hrs vs 8 hrs?) this am.  Orders to change to PO rivaroxaban  CBC: Hgb and Pltc WNL   CrCl (using actual BW as studied): 71ml/min  Plans for PO anticoagulation per cardiology (CHADS2Vasc Score = 2)  On Verapamil prior to admission, changed to diltiazem - Both are CYP-3A4 inhibitors and P-gp inhibitors and can increase rivaroxaban levels.  Per mfg, drug is not contraindicated but use when benefits > risks. With good CrCl appears interaction is less significant  (no increase risk of bleeding per studies)  Goal of Therapy:  Dose appropriately for renal function and indication   Plan:    Start rivaroxaban 20mg  x 1 with lunch today then daily with evening meal  D/C heparin gtt with start of xarelto  If possible and appropriate, change diltiazem to beta-blocker to avoid possible interaction with diltiazem  Monitor for bleeding  Educate  Doreene Eland, PharmD, BCPS.   Pager: [phone]  07/24/2013, 7:27 AM

## 2013-07-25 DIAGNOSIS — M25461 Effusion, right knee: Secondary | ICD-10-CM | POA: Diagnosis not present

## 2013-07-25 DIAGNOSIS — M7989 Other specified soft tissue disorders: Secondary | ICD-10-CM | POA: Diagnosis not present

## 2013-07-25 LAB — URIC ACID: Uric Acid, Serum: 10.4 mg/dL — ABNORMAL HIGH (ref 2.4–7.0)

## 2013-07-25 LAB — CBC
HEMATOCRIT: 43.6 % (ref 36.0–46.0)
HEMOGLOBIN: 13.9 g/dL (ref 12.0–15.0)
MCH: 29.2 pg (ref 26.0–34.0)
MCHC: 31.9 g/dL (ref 30.0–36.0)
MCV: 91.6 fL (ref 78.0–100.0)
Platelets: 371 10*3/uL (ref 150–400)
RBC: 4.76 MIL/uL (ref 3.87–5.11)
RDW: 14.1 % (ref 11.5–15.5)
WBC: 21.6 10*3/uL — ABNORMAL HIGH (ref 4.0–10.5)

## 2013-07-25 LAB — BASIC METABOLIC PANEL
Anion gap: 14 (ref 5–15)
BUN: 25 mg/dL — AB (ref 6–23)
CHLORIDE: 96 meq/L (ref 96–112)
CO2: 30 meq/L (ref 19–32)
CREATININE: 0.93 mg/dL (ref 0.50–1.10)
Calcium: 10.1 mg/dL (ref 8.4–10.5)
GFR calc Af Amer: 68 mL/min — ABNORMAL LOW (ref >90)
GFR calc non Af Amer: 59 mL/min — ABNORMAL LOW (ref >90)
Glucose, Bld: 160 mg/dL — ABNORMAL HIGH (ref 70–99)
Potassium: 3.7 mEq/L (ref 3.7–5.3)
Sodium: 140 mEq/L (ref 137–147)

## 2013-07-25 MED ORDER — IBUPROFEN 200 MG PO TABS
200.0000 mg | ORAL_TABLET | ORAL | Status: DC | PRN
  Administered 2013-07-25 – 2013-07-28 (×9): 200 mg via ORAL
  Filled 2013-07-25 (×8): qty 1

## 2013-07-25 MED ORDER — METOPROLOL TARTRATE 25 MG PO TABS
25.0000 mg | ORAL_TABLET | Freq: Two times a day (BID) | ORAL | Status: DC
  Administered 2013-07-25 – 2013-07-28 (×7): 25 mg via ORAL
  Filled 2013-07-25 (×8): qty 1

## 2013-07-25 MED ORDER — DILTIAZEM HCL ER COATED BEADS 240 MG PO CP24
240.0000 mg | ORAL_CAPSULE | Freq: Every day | ORAL | Status: DC
  Administered 2013-07-25 – 2013-07-28 (×4): 240 mg via ORAL
  Filled 2013-07-25 (×4): qty 1

## 2013-07-25 NOTE — Consult Note (Signed)
[facility] PROGRESS NOTE  Cristina [name] KXF:[id number] DOB: 06/16/1940 DOA: 07/22/2013 PCP: Tawanna Solo, MD  Brief narrative: 73 y.o. female with past medical history of, hypertension who presented to [facility] ED 07/22/2013 for evaluation of irregular heart rhythm as detected by patient's primary care physician. Patient reported on admission having lower extremity swelling and weight gain. Patient was found to have atrial fibrillation with rapid ventricular rate, she had evidence of volume overload with increased peripheral edema and pulmonary vascular congestion on chest x-ray as well as elevated BNP. She required IV diltiazem, heparin and Lasix. Overall she has had good diuresis. Her hospital course is complicated due to acute right knee swelling. 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 Ortho consulted.   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 Orthopedic surgery consulted. - appreciate cardio holding xarelto in case ortho plans procedure  Active problems:  Atrial fibrillation / Acute diastolic CHF  - Management per primary team, cardiology    Leisa Lenz  [facility]  [phone] or [phone]  DVT prophylaxis: xarelto on hold if possible ortho procedure planned   Code Status: full code  Family Communication: plan of care discussed with the patient Disposition Plan: per primary team   Leisa Lenz, MD  [facility] Pager [phone]  If 7PM-7AM, please contact night-coverage [email or web address] Password [email or web address] 07/25/2013, 8:18 AM   LOS: 3 days   Consultants:  Orthopedic surgery    HPI/Subjective: No acute overnight events.  Objective: Filed Vitals:   07/24/13 1528 07/24/13 1639 07/24/13 2116 07/25/13 0610  BP: 124/72 111/58 120/60 151/70  Pulse: 100 101 106 116  Temp:   98.1 F (36.7 C) 98 F (36.7 C)  TempSrc:   Oral Oral  Resp:   18 20  Height:       Weight:    77.7 kg (171 lb 4.8 oz)  SpO2:   94% 93%    Intake/Output Summary (Last 24 hours) at 07/25/13 0818 Last data filed at 07/24/13 2224  Gross per 24 hour  Intake  798.2 ml  Output    250 ml  Net  548.2 ml    Exam:   General:  Pt is alert, follows commands appropriately, not in acute distress  Cardiovascular: irregular rhythm, S1/S2, no murmurs  Respiratory: Clear to auscultation bilaterally, no wheezing, no crackles, no rhonchi  Abdomen: Soft, non tender, non distended, bowel sounds present  Extremities: right knee swelling, pulses DP and PT palpable bilaterally  Neuro: Grossly nonfocal  Data Reviewed: Basic Metabolic Panel:  Recent Labs Lab 07/22/13 1651 07/23/13 0622 07/24/13 0217 07/25/13 0448  NA 136* 143 140 140  K 3.8 2.9* 3.4* 3.7  CL 97 100 97 96  CO2 23 30 30 30   GLUCOSE 97 113* 125* 160*  BUN 26* 23 23 25*  CREATININE 0.84 0.79 0.82 0.93  CALCIUM 9.4 8.8 9.6 10.1   Liver Function Tests:  Recent Labs Lab 07/22/13 1651  AST 13  ALT 18  ALKPHOS 117  BILITOT 0.4  PROT 6.4  ALBUMIN 3.7   No results found for this basename: LIPASE, AMYLASE,  in the last 168 hours No results found for this basename: AMMONIA,  in the last 168 hours CBC:  Recent Labs Lab 07/22/13 1651 07/24/13 0217 07/25/13 0448  WBC 15.4* 17.1* 21.6*  HGB 12.5 14.2 13.9  HCT 38.1 43.1 43.6  MCV 88.2 89.0 91.6  PLT 381 392 371  Cardiac Enzymes:  Recent Labs Lab 07/23/13 0037 07/23/13 0622 07/23/13 1230  TROPONINI <0.30 <0.30 <0.30   BNP: No components found with this basename: POCBNP,  CBG: No results found for this basename: GLUCAP,  in the last 168 hours  Recent Results (from the past 240 hour(s))  MRSA PCR SCREENING     Status: None   Collection Time    07/22/13 11:44 PM      Result Value Ref Range Status   MRSA by PCR NEGATIVE  NEGATIVE Final     Studies: Dg Knee Complete 4 Views Right 07/24/2013     IMPRESSION: 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 There is a joint effusion. Bones are osteoporotic. No fracture or dislocation apparent.   Electronically Signed   By: Lowella Grip M.D.   On: 07/24/2013 16:09    Scheduled Meds: . diltiazem  240 mg Oral Daily  . furosemide  20 mg Oral Daily  . lisinopril  40 mg Oral q morning - 10a  . metoprolol tartrate  25 mg Oral BID  . potassium chloride  20 mEq Oral Daily  . pramipexole  2 mg Oral q morning - 10a   Continuous Infusions:

## 2013-07-25 NOTE — Consult Note (Signed)
ORTHOPAEDIC CONSULTATION  REQUESTING PHYSICIAN: Herminio Commons, MD  Chief Complaint: Right knee pain  HPI: Cristina [name] is a 73 y.o. female who complains of  right knee pain that has been off and on for the past couple of weeks, but has gotten severe but worse in the past day or 2. She was admitted for atrial fibrillation, and has been told that she had gout before, although there has never been a definitive diagnosis made with fluid. She has a family history of gout, and her father, who had a gout attack after heart attack late in life. Pain is located diffusely around the right knee, she also has pain over the right index metacarpophalangeal joint, and the warmth and swelling in both locations. She denies fevers or chills.  Past Medical History  Diagnosis Date  . Hypertension   . Asthma   . Ejection fraction    Past Surgical History  Procedure Laterality Date  . Back surgery     History   Social History  . Marital Status: Divorced    Spouse Name: N/A    Number of Children: N/A  . Years of Education: N/A   Occupational History  . Retired    Social History Main Topics  . Smoking status: Never Smoker   . Smokeless tobacco: None  . Alcohol Use: Yes     Comment: occ  . Drug Use: None  . Sexual Activity: None   Other Topics Concern  . None   Social History Narrative   Lives in [place]. No family history of premature coronary artery disease in either her parents or siblings of which she is aware.   No family history on file. No Known Allergies Prior to Admission medications   Medication Sig Start Date End Date Taking? Authorizing Provider  lisinopril (PRINIVIL,ZESTRIL) 40 MG tablet Take 80 mg by mouth every morning.   Yes Historical Provider, MD  naproxen sodium (ANAPROX) 220 MG tablet Take 220 mg by mouth 2 (two) times daily as needed (pain).   Yes Historical Provider, MD  pramipexole (MIRAPEX) 1 MG tablet Take 2 mg by mouth every morning.   Yes  Historical Provider, MD  triamterene-hydrochlorothiazide (DYAZIDE) 37.5-25 MG per capsule Take 1 capsule by mouth every morning.   Yes Historical Provider, MD  verapamil (CALAN-SR) 240 MG CR tablet Take 240 mg by mouth at bedtime.   Yes Historical Provider, MD   Dg Knee Complete 4 Views Right  07/24/2013   CLINICAL DATA:  Pain and swelling  EXAM: RIGHT KNEE - COMPLETE 4+ VIEW  COMPARISON:  None.  FINDINGS: Frontal, lateral, and bilateral oblique views were obtained. There is no fracture or dislocation. There is a joint effusion.  There is generalized joint space narrowing is spurring in all compartments. 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 Bones are osteoporotic.  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 There is a joint effusion. Bones are osteoporotic. No fracture or dislocation apparent.   Electronically Signed   By: Lowella Grip M.D.   On: 07/24/2013 16:09    Positive ROS: All other systems have been reviewed and were otherwise negative with the exception of those mentioned in the HPI and as above.  Physical Exam: General: Alert, no acute distress Cardiovascular: No pedal edema Respiratory: No cyanosis, no use of accessory musculature GI: No organomegaly, abdomen is soft and non-tender Skin: She has an area of erythema over the index finger of the metacarpal phalangeal  joint on the right hand, and her right knee has no skin breaks, or cellulitis. Neurologic: Sensation intact distally Psychiatric: Patient is competent for consent with normal mood and affect Lymphatic: No axillary or cervical lymphadenopathy  MUSCULOSKELETAL: Right knee has positive effusion, range of motion 0-60, pain diffusely. Right hand can make a fist although not completely, she does have swelling over the index metacarpal phalangeal joint.  Assessment: Likely gout attack, right index metacarpophalangeal  joint and right knee. Recent anticoagulation xarelto after atrial fibrillation onset.  Plan: This is an acute issue, however it seems to be somewhat recurrent. I do highly suspect this is gout, in order to plan for an aspiration and injection today, and we'll send the fluid to the lab for analysis, Gram stain, culture and sensitivity, and crystal analysis. She does have risk factors when considering the aspiration, particularly the fact that she is on anticoagulation, however hopefully with the injection we'll get some degree of symptomatic relief.  07/25/2013  10:44 PM  PATIENT:  Cristina [name]    PRE-PROCEDURE DIAGNOSIS:  Right knee effusion  POST-OPERATIVE DIAGNOSIS:  Same  PROCEDURE:  Aspiration and Intra-articular injection right knee  PROCEDURE DETAILS:  After informed verbal consent was obtained the superolateral portal was prepped with alcohol and the skin was anesthetized with 3 cc of Marcaine, and the knee was aspirated with an 18-gauge needle, yielding a total of 30 ml of yellowish somewhat turbid joint fluid, and 4 ml of Marcaine and 1 ml of Depo-Medrol (40mg ) was injected. She tolerated this well and a Band-Aid was placed. A light compressive wrap was then also applied by the nursing staff.  I will plan to followup tomorrow to determine results, and evaluate clinical efficacy. She is likely to need to be on call to seen, possibly prednisone, and I would be the medical management of a gout to the medical team particularly given her other coexisting comorbidities.  Okay to followup with me in 2 weeks.   Johnny Bridge, MD Cell [phone]   07/25/2013 10:41 PM

## 2013-07-25 NOTE — Progress Notes (Signed)
Patient ID: Cristina [name], female   DOB: 1940/11/02, 73 y.o.   MRN: [id number]    SUBJECTIVE:  I appreciate help from internal medicine concerning the knee swelling. There is swelling of the knee with effusion. The patient received a starting dose of Xarelto yesterday. I stopped it this morning as a knee tap may be planned. The patient's atrial fibrillation rate has been elevated. Her total volume status has improved. She is tearful this morning and she is feeling poorly. She has a new finding with swelling in her right hand. This may suggest that she has a more systemic acute arthritic problem. It appears in the home medications that she may have been on a steroid taper recently. However I do not have the specifics.   Filed Vitals:   07/24/13 1528 07/24/13 1639 07/24/13 2116 07/25/13 0610  BP: 124/72 111/58 120/60 151/70  Pulse: 100 101 106 116  Temp:   98.1 F (36.7 C) 98 F (36.7 C)  TempSrc:   Oral Oral  Resp:   18 20  Height:      Weight:    171 lb 4.8 oz (77.7 kg)  SpO2:   94% 93%     Intake/Output Summary (Last 24 hours) at 07/25/13 2778 Last data filed at 07/24/13 2224  Gross per 24 hour  Intake  810.2 ml  Output    250 ml  Net  560.2 ml    LABS: Basic Metabolic Panel:  Recent Labs  07/24/13 0217 07/25/13 0448  NA 140 140  K 3.4* 3.7  CL 97 96  CO2 30 30  GLUCOSE 125* 160*  BUN 23 25*  CREATININE 0.82 0.93  CALCIUM 9.6 10.1   Liver Function Tests:  Recent Labs  07/22/13 1651  AST 13  ALT 18  ALKPHOS 117  BILITOT 0.4  PROT 6.4  ALBUMIN 3.7   No results found for this basename: LIPASE, AMYLASE,  in the last 72 hours CBC:  Recent Labs  07/24/13 0217 07/25/13 0448  WBC 17.1* 21.6*  HGB 14.2 13.9  HCT 43.1 43.6  MCV 89.0 91.6  PLT 392 371   Cardiac Enzymes:  Recent Labs  07/23/13 0037 07/23/13 0622 07/23/13 1230  TROPONINI <0.30 <0.30 <0.30   BNP: No components found with this basename: POCBNP,  D-Dimer: No results found for this  basename: DDIMER,  in the last 72 hours Hemoglobin A1C:  Recent Labs  07/23/13 0037  HGBA1C 6.0*   Fasting Lipid Panel: No results found for this basename: CHOL, HDL, LDLCALC, TRIG, CHOLHDL, LDLDIRECT,  in the last 72 hours Thyroid Function Tests:  Recent Labs  07/23/13 0037  TSH 2.980    RADIOLOGY: Dg Chest 2 View  07/22/2013   CLINICAL DATA:  Shortness of breath, history hypertension, asthma  EXAM: CHEST  2 VIEW  COMPARISON:  None.  FINDINGS: Enlargement of cardiac silhouette with pulmonary vascular congestion.  Calcified tortuous thoracic aorta.  Bronchitic changes with minimal RIGHT basilar atelectasis.  No acute infiltrate, pleural effusion, or pneumothorax.  Multiple old appearing BILATERAL rib fractures.  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  IMPRESSION: Enlargement of cardiac silhouette with pulmonary vascular congestion.  Chronic bronchitic changes.  No acute infiltrate.  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   Electronically Signed   By:  Lavonia Dana M.D.   On: 07/22/2013 17:46   Dg Knee Complete 4 Views Right  07/24/2013   CLINICAL DATA:  Pain and swelling  EXAM: RIGHT KNEE - COMPLETE 4+ VIEW  COMPARISON:  None.  FINDINGS: Frontal, lateral, and bilateral oblique views were obtained. There is no fracture or dislocation. There is a joint effusion.  There is generalized joint space narrowing is spurring in all compartments. 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 Bones are osteoporotic.  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 There is a joint  effusion. Bones are osteoporotic. No fracture or dislocation apparent.   Electronically Signed   By: Lowella Grip M.D.   On: 07/24/2013 16:09    PHYSICAL EXAM  patient is tearful this morning. She is uncomfortable with new swelling of the right hand. She's lying flat in bed. She has family members sleeping in the room. She is oriented to person time and place. Affect is normal. Head is atraumatic. Lungs reveal a few scattered rhonchi. Cardiac exam reveals S1 and S2. The rhythm is irregularly irregular. The rate is increased. The abdomen is soft. There is no edema in the left lower remedy. She does have some swelling of the right knee. She has some redness and swelling of her right hand.   TELEMETRY: I have reviewed telemetry today July 9, 02/17/1939 at there is atrial fibrillation. Currently the rate is in the range of 110-115.   ASSESSMENT AND PLAN:    Hypertension    Blood pressure is controlled.    Atrial fibrillation with rapid ventricular response     Atrial fibrillation rate is increased. Med dosing will be adjusted further. I increased her Cardizem and add a beta blocker today. We will continue to follow her heart rate.    Ejection fraction    Ejection fraction is 65% by echo this admission.    Acute diastolic CHF (congestive heart failure)    Overall total body volume is improved. She is on a maintenance dose of 20 of Lasix orally now. Renal function is stable. Atrial fib rate control will be very important.    On continuous oral anticoagulation     Xarelto was started yesterday. I stopped her Xarelto this morning as it is possible she may require a tap of one of her joints. Ultimately her anticoagulation needs to be resumed.    Swelling of right knee joint     Appreciate help from internal medicine. They will be reviewing the x-ray in making decisions about further consultation concerning the knee. There has been mention of possible tap of her knee. Today she has a new  problem with swelling of the right hand. He will be very helpful with the ongoing evaluation of the etiology of her arthritic problem.  Swelling of the right hand.    This is a new problem today. It may suggest that she has a diffuse arthritic problem. It is not clear to me if she received a steroid Dosepak recently. Appreciate the ongoing help from internal medicine.  From the cardiology viewpoint we need better rate control. She needs further ongoing medical help concerning swelling of the hand and knee. She is not stable for discharge today.     Dola Argyle 07/25/2013 6:27 AM

## 2013-07-25 NOTE — Progress Notes (Signed)
Called ortho consult, they will see the pt today. Leisa Lenz [facility] [phone]

## 2013-07-26 DIAGNOSIS — Z7901 Long term (current) use of anticoagulants: Secondary | ICD-10-CM

## 2013-07-26 LAB — SYNOVIAL CELL COUNT + DIFF, W/ CRYSTALS
Lymphocytes-Synovial Fld: 1 % (ref 0–20)
Monocyte-Macrophage-Synovial Fluid: 11 % — ABNORMAL LOW (ref 50–90)
Neutrophil, Synovial: 88 % — ABNORMAL HIGH (ref 0–25)
WBC, Synovial: 22814 /mm3 — ABNORMAL HIGH (ref 0–200)

## 2013-07-26 LAB — BASIC METABOLIC PANEL
Anion gap: 14 (ref 5–15)
BUN: 29 mg/dL — AB (ref 6–23)
CO2: 28 meq/L (ref 19–32)
CREATININE: 0.98 mg/dL (ref 0.50–1.10)
Calcium: 9.8 mg/dL (ref 8.4–10.5)
Chloride: 98 mEq/L (ref 96–112)
GFR calc Af Amer: 64 mL/min — ABNORMAL LOW (ref >90)
GFR, EST NON AFRICAN AMERICAN: 55 mL/min — AB (ref >90)
GLUCOSE: 158 mg/dL — AB (ref 70–99)
Potassium: 3.9 mEq/L (ref 3.7–5.3)
Sodium: 140 mEq/L (ref 137–147)

## 2013-07-26 LAB — GRAM STAIN: Special Requests: NORMAL

## 2013-07-26 MED ORDER — ALLOPURINOL 100 MG PO TABS
100.0000 mg | ORAL_TABLET | Freq: Every day | ORAL | Status: DC
  Administered 2013-07-26 – 2013-07-28 (×3): 100 mg via ORAL
  Filled 2013-07-26 (×3): qty 1

## 2013-07-26 MED ORDER — RIVAROXABAN 20 MG PO TABS
20.0000 mg | ORAL_TABLET | Freq: Every day | ORAL | Status: DC
  Administered 2013-07-26 – 2013-07-27 (×2): 20 mg via ORAL
  Filled 2013-07-26 (×3): qty 1

## 2013-07-26 MED ORDER — COLCHICINE 0.6 MG PO TABS
0.6000 mg | ORAL_TABLET | Freq: Every day | ORAL | Status: DC
  Administered 2013-07-26 – 2013-07-28 (×3): 0.6 mg via ORAL
  Filled 2013-07-26 (×3): qty 1

## 2013-07-26 NOTE — Progress Notes (Addendum)
Subjective:  Breathing better today. Major complaints are with swollen joints (Right knee)  Objective:  Temp:  [98.1 F (36.7 C)-99.4 F (37.4 C)] 98.1 F (36.7 C) (07/10 0553) Pulse Rate:  [81-103] 81 (07/10 0553) Resp:  [20] 20 (07/10 0553) BP: (101-132)/(52-69) 101/65 mmHg (07/10 0553) SpO2:  [97 %-99 %] 99 % (07/10 0553) Weight:  [170 lb 13.7 oz (77.5 kg)] 170 lb 13.7 oz (77.5 kg) (07/10 0553) Weight change: -7.1 oz (-0.2 kg)  Intake/Output from previous day: 07/09 0701 - 07/10 0700 In: 120 [P.O.:120] Out: -   Intake/Output from this shift:    Physical Exam: General appearance: alert, no distress and mild distress Neck: no adenopathy, no carotid bruit, no JVD, supple, symmetrical, trachea midline and thyroid not enlarged, symmetric, no tenderness/mass/nodules Lungs: clear to auscultation bilaterally Heart: irregularly irregular rhythm Extremities: No edema, moderately swollen Right knee, warm to touch  Lab Results: Results for orders placed during the hospital encounter of 07/22/13 (from the past 48 hour(s))  BASIC METABOLIC PANEL     Status: Abnormal   Collection Time    07/25/13  4:48 AM      Result Value Ref Range   Sodium 140  137 - 147 mEq/L   Potassium 3.7  3.7 - 5.3 mEq/L   Chloride 96  96 - 112 mEq/L   CO2 30  19 - 32 mEq/L   Glucose, Bld 160 (*) 70 - 99 mg/dL   BUN 25 (*) 6 - 23 mg/dL   Creatinine, Ser 0.93  0.50 - 1.10 mg/dL   Calcium 10.1  8.4 - 10.5 mg/dL   GFR calc non Af Amer 59 (*) >90 mL/min   GFR calc Af Amer 68 (*) >90 mL/min   Comment: (NOTE)     The eGFR has been calculated using the CKD EPI equation.     This calculation has not been validated in all clinical situations.     eGFR's persistently <90 mL/min signify possible Chronic Kidney     Disease.   Anion gap 14  5 - 15  CBC     Status: Abnormal   Collection Time    07/25/13  4:48 AM      Result Value Ref Range   WBC 21.6 (*) 4.0 - 10.5 K/uL   RBC 4.76  3.87 - 5.11 MIL/uL   Hemoglobin 13.9  12.0 - 15.0 g/dL   HCT 43.6  36.0 - 46.0 %   MCV 91.6  78.0 - 100.0 fL   MCH 29.2  26.0 - 34.0 pg   MCHC 31.9  30.0 - 36.0 g/dL   RDW 14.1  11.5 - 15.5 %   Platelets 371  150 - 400 K/uL  URIC ACID     Status: Abnormal   Collection Time    07/25/13  4:48 AM      Result Value Ref Range   Uric Acid, Serum 10.4 (*) 2.4 - 7.0 mg/dL  BODY FLUID CULTURE     Status: None   Collection Time    07/25/13 10:43 PM      Result Value Ref Range   Specimen Description KNEE     Special Requests Normal     Gram Stain       Value: ABUNDANT WBC PRESENT, PREDOMINANTLY PMN     NO ORGANISMS SEEN     Performed at [facility]   Culture       Value: NO GROWTH     Performed at [facility]   Report Status PENDING    SYNOVIAL CELL COUNT + DIFF, W/ CRYSTALS     Status: Abnormal   Collection Time    07/25/13 10:43 PM      Result Value Ref Range   Color, Synovial Y (*) YELLOW   Appearance-Synovial CLOUDY (*) CLEAR   Crystals, Fluid EXTRACELLULAR MONOSODIUM URATE CRYSTALS     WBC, Synovial 22814 (*) 0 - 200 /cu mm   Neutrophil, Synovial 88 (*) 0 - 25 %   Lymphocytes-Synovial Fld 1  0 - 20 %   Monocyte-Macrophage-Synovial Fluid 11 (*) 50 - 90 %  GRAM STAIN     Status: None   Collection Time    07/25/13 10:43 PM      Result Value Ref Range   Specimen Description KNEE     Special Requests Normal     Gram Stain       Value: WBC PRESENT, PREDOMINANTLY PMN     NO ORGANISMS SEEN     Gram Stain Report Called to,Read Back By and Verified With: L. VERGERDEDIOS,RN AT 0021 ON 07/26/13 BY [place]     CYTOSPIN   Report Status 07/26/2013 FINAL    BASIC METABOLIC PANEL     Status: Abnormal   Collection Time    07/26/13  4:40 AM      Result Value Ref Range   Sodium 140  137 - 147 mEq/L   Potassium 3.9  3.7 - 5.3 mEq/L   Chloride 98  96 - 112 mEq/L   CO2 28  19 - 32 mEq/L   Glucose, Bld 158 (*) 70 - 99 mg/dL   BUN 29 (*) 6 - 23 mg/dL   Creatinine, Ser 0.98  0.50 - 1.10 mg/dL    Calcium 9.8  8.4 - 10.5 mg/dL   GFR calc non Af Amer 55 (*) >90 mL/min   GFR calc Af Amer 64 (*) >90 mL/min   Comment: (NOTE)     The eGFR has been calculated using the CKD EPI equation.     This calculation has not been validated in all clinical situations.     eGFR's persistently <90 mL/min signify possible Chronic Kidney     Disease.   Anion gap 14  5 - 15    Imaging: Imaging results have been reviewed  Assessment/Plan:   1. Active Problems: 2.   Hypertension 3.   Atrial fibrillation with rapid ventricular response 4.   Ejection fraction 5.   Acute diastolic CHF (congestive heart failure) 6.   On continuous oral anticoagulation 7.   Swelling of right knee joint 8.   Swelling of right hand 9.   Time Spent Directly with Patient:  20 minutes  Length of Stay:  LOS: 4 days   1. CHF- Diuresed almost 3 liters. Now on low dose oral diuretics. Appears euvolemic. EF nl. CHF diastolic and related to Afib with RVR  2 AFIB- VR better controlled on BB/CCB. HR in 70-80s I started Xarelto per pharmacy   3. Probable Gout- Appreciate TRH and Orthos help with diagnosis and Rx of swollen Right knew as well as other joints. S/P aspiration by Dr. Haskell Riling yesterday with administration of intra articular steroids. Lab results pending.  Will D/C when arthritic issues diagnosed and adequately Rx. Labs shor serum Uric Acid elevated at 10 and aspirate shows UA crystals. Will probably require colchisine and allopurinol as well as steroid taper. Will defer to Ortho/TRH.  Lorretta Harp 07/26/2013, 8:18 AM

## 2013-07-26 NOTE — Consult Note (Signed)
[facility] PROGRESS NOTE  Cristina [name] QMV:[id number] DOB: 10-Nov-1940 DOA: 07/22/2013 PCP: Tawanna Solo, MD  Brief narrative: 73 y.o. female with past medical history of, hypertension who presented to [facility] ED 07/22/2013 for evaluation of irregular heart rhythm as detected by patient's primary care physician. Patient reported on admission having lower extremity swelling and weight gain. Patient was found to have atrial fibrillation with rapid ventricular rate, she had evidence of volume overload with increased peripheral edema and pulmonary vascular congestion on chest x-ray as well as elevated BNP. She required IV diltiazem, heparin and Lasix. Overall she has had good diuresis. Her hospital course is complicated due to acute right knee swelling. 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 Ortho consulted.   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 Orthopedic surgery consulted. Patient is status post right knee aspiration and intra-articular steroid injection, synovial fluid analysis so far shows no organisms. - Further management per orthopedic surgery - Added ibuprofen for inflammation. - Added allopurinol 100 mg daily for treatment of gout. Uric acid was 10.4 on this admission Active problems:  Atrial fibrillation / Acute diastolic CHF  - Management per primary team, cardiology    Code Status: full code  Family Communication: plan of care discussed with the patient Disposition Plan: home when stable   Leisa Lenz, MD  [facility] Pager [phone]  Will sign off since orthopedic surgeries following. Please call if there are other questions, cell phone number is [phone].  If 7PM-7AM, please contact night-coverage [email or web address] Password [email or web address] 07/26/2013, 10:41 AM   LOS: 4 days    HPI/Subjective: No acute overnight events.  Objective: Filed  Vitals:   07/25/13 1350 07/25/13 2224 07/26/13 0553 07/26/13 1037  BP: 132/69 109/52 101/65 119/59  Pulse: 95 103 81   Temp: 99.4 F (37.4 C)  98.1 F (36.7 C)   TempSrc: Oral  Oral   Resp: 20 20 20    Height:      Weight:   77.5 kg (170 lb 13.7 oz)   SpO2: 97% 97% 99%     Intake/Output Summary (Last 24 hours) at 07/26/13 1041 Last data filed at 07/25/13 1500  Gross per 24 hour  Intake    120 ml  Output      0 ml  Net    120 ml    Exam:   General:  Pt is alert, follows commands appropriately, not in acute distress  Cardiovascular: tachycardia,  S1/S2, no murmurs  Respiratory: Clear to auscultation bilaterally, no wheezing, no crackles, no rhonchi  Abdomen: Soft, non tender, non distended, bowel sounds present  Extremities: Right knee still swollen but less tender, pulses DP and PT palpable bilaterally  Neuro: Grossly nonfocal  Data Reviewed: Basic Metabolic Panel:  Recent Labs Lab 07/22/13 1651 07/23/13 0622 07/24/13 0217 07/25/13 0448 07/26/13 0440  NA 136* 143 140 140 140  K 3.8 2.9* 3.4* 3.7 3.9  CL 97 100 97 96 98  CO2 23 30 30 30 28   GLUCOSE 97 113* 125* 160* 158*  BUN 26* 23 23 25* 29*  CREATININE 0.84 0.79 0.82 0.93 0.98  CALCIUM 9.4 8.8 9.6 10.1 9.8   Liver Function Tests:  Recent Labs Lab 07/22/13 1651  AST 13  ALT 18  ALKPHOS 117  BILITOT 0.4  PROT 6.4  ALBUMIN 3.7   No results found for this basename: LIPASE, AMYLASE,  in the last 168  hours No results found for this basename: AMMONIA,  in the last 168 hours CBC:  Recent Labs Lab 07/22/13 1651 07/24/13 0217 07/25/13 0448  WBC 15.4* 17.1* 21.6*  HGB 12.5 14.2 13.9  HCT 38.1 43.1 43.6  MCV 88.2 89.0 91.6  PLT 381 392 371   Cardiac Enzymes:  Recent Labs Lab 07/23/13 0037 07/23/13 0622 07/23/13 1230  TROPONINI <0.30 <0.30 <0.30   BNP: No components found with this basename: POCBNP,  CBG: No results found for this basename: GLUCAP,  in the last 168 hours  Recent  Results (from the past 240 hour(s))  MRSA PCR SCREENING     Status: None   Collection Time    07/22/13 11:44 PM      Result Value Ref Range Status   MRSA by PCR NEGATIVE  NEGATIVE Final   Comment:            The GeneXpert MRSA Assay (FDA     approved for NASAL specimens     only), is one component of a     comprehensive MRSA colonization     surveillance program. It is not     intended to diagnose MRSA     infection nor to guide or     monitor treatment for     MRSA infections.  BODY FLUID CULTURE     Status: None   Collection Time    07/25/13 10:43 PM      Result Value Ref Range Status   Specimen Description KNEE   Final   Special Requests Normal   Final   Gram Stain     Final   Value: ABUNDANT WBC PRESENT, PREDOMINANTLY PMN     NO ORGANISMS SEEN     Performed at [facility]   Culture     Final   Value: NO GROWTH     Performed at [facility]   Report Status PENDING   Incomplete  GRAM STAIN     Status: None   Collection Time    07/25/13 10:43 PM      Result Value Ref Range Status   Specimen Description KNEE   Final   Special Requests Normal   Final   Gram Stain     Final   Value: WBC PRESENT, PREDOMINANTLY PMN     NO ORGANISMS SEEN     Gram Stain Report Called to,Read Back By and Verified With: L. VERGERDEDIOS,RN AT 0021 ON 07/26/13 BY [place]     CYTOSPIN   Report Status 07/26/2013 FINAL   Final     Studies: Dg Knee Complete 4 Views Right  07/24/2013   CLINICAL DATA:  Pain and swelling  EXAM: RIGHT KNEE - COMPLETE 4+ VIEW  COMPARISON:  None.  FINDINGS: Frontal, lateral, and bilateral oblique views were obtained. There is no fracture or dislocation. There is a joint effusion.  There is generalized joint space narrowing is spurring in all compartments. 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 Bones are osteoporotic.  IMPRESSION: Extensive osteoarthritic change. There is evidence of synovial  chondromatosis. There is a joint effusion. Bones are osteoporotic. No fracture or dislocation apparent.   Electronically Signed   By: Lowella Grip M.D.   On: 07/24/2013 16:09    Scheduled Meds: . diltiazem  240 mg Oral Daily  . furosemide  20 mg Oral Daily  . lisinopril  40 mg Oral q morning - 10a  . metoprolol tartrate  25 mg Oral BID  .  potassium chloride  20 mEq Oral Daily  . pramipexole  2 mg Oral q morning - 10a   Continuous Infusions:

## 2013-07-26 NOTE — Progress Notes (Signed)
ANTICOAGULATION CONSULT NOTE - Follow Up Consult  Pharmacy Consult for Rivaroxaban Indication: atrial fibrillation  No Known Allergies  Patient Measurements: Height: 5\' 4"  (162.6 cm) Weight: 170 lb 13.7 oz (77.5 kg) IBW/kg (Calculated) : 54.7 Heparin Dosing Weight: 63kg  Vital Signs: Temp: 98.1 F (36.7 C) (07/10 0553) Temp src: Oral (07/10 0553) BP: 119/59 mmHg (07/10 1037) Pulse Rate: 81 (07/10 0553)  Labs:  Recent Labs  07/23/13 1230 07/23/13 1859 07/24/13 0217 07/25/13 0448 07/26/13 0440  HGB  --   --  14.2 13.9  --   HCT  --   --  43.1 43.6  --   PLT  --   --  392 371  --   HEPARINUNFRC  --  0.21* 0.37  --   --   CREATININE  --   --  0.82 0.93 0.98  TROPONINI <0.30  --   --   --   --    Estimated Creatinine Clearance: 50.7 ml/min (by C-G formula based on Cr of 0.98).  Assessment: 12 YOF presents with SOB, admitted with Afib and RVR.  Pt initially started on IV heparin then transitioned to PO xarelto.  Xarelto held 7/9 for ortho procedure late last night which confirmed gout, ok to resume xarelto today per cardiology.    7/9 CBC: Hgb and Pltc WNL   CrCl (using actual BW as studied): 62ml/min  CHADS2Vasc Score = 2  On Verapamil prior to admission, changed to diltiazem - Both are CYP-3A4 inhibitors and P-gp inhibitors and can increase rivaroxaban levels.  Per mfg, drug is not contraindicated but use when benefits > risks. With good CrCl appears interaction is less significant  (no increase risk of bleeding per studies)  Goal of Therapy:  Dose appropriately for renal function and indication   Plan:   Resume xarelto 20mg  po daily with supper   If possible and appropriate, change diltiazem to beta-blocker to avoid possible interaction with diltiazem  Monitor for bleeding  Education completed  Ralene Bathe, PharmD, BCPS 07/26/2013, 11:57 AM  Pager: [phone]

## 2013-07-26 NOTE — Progress Notes (Signed)
Knee fluid with gout.  Colchicine ordered.  Will defer to primary team regarding prednisone iv or po, but may be of benefit.  Spoke with patient and results given.   Johnny Bridge, MD

## 2013-07-27 MED ORDER — METHYLPREDNISOLONE SODIUM SUCC 125 MG IJ SOLR
60.0000 mg | Freq: Once | INTRAMUSCULAR | Status: AC
  Administered 2013-07-27: 60 mg via INTRAVENOUS
  Filled 2013-07-27: qty 0.96

## 2013-07-27 NOTE — Progress Notes (Signed)
ANTICOAGULATION CONSULT NOTE - Follow Up Consult  Pharmacy Consult for rivaroxaban Indication: atrial fibrillation  No Known Allergies  Patient Measurements: Height: 5\' 4"  (162.6 cm) Weight: 172 lb 6.4 oz (78.2 kg) IBW/kg (Calculated) : 54.7 Heparin Dosing Weight: 63kg  Vital Signs: Temp: 97.3 F (36.3 C) (07/11 0440) Temp src: Oral (07/11 0440) BP: 104/51 mmHg (07/11 0440) Pulse Rate: 76 (07/11 0440)  Labs:  Recent Labs  07/25/13 0448 07/26/13 0440  HGB 13.9  --   HCT 43.6  --   PLT 371  --   CREATININE 0.93 0.98   Estimated Creatinine Clearance: 51 ml/min (by C-G formula based on Cr of 0.98).  Assessment: 71 YOF presents with SOB, admitted with Afib and RVR.  Heparin started per pharmacy consult.   First heparin level is subtherapeutic at 0.13-rate was increased from 800 to 1000 units/hr  Second heparin level is subtherapeutic at 0.21 units/ml  Third HL = 0.37 (drawn @ ~ 5 hrs vs 8 hrs?) this am.  Orders to change to PO rivaroxaban  CBC: Hgb and Pltc WNL   CrCl (using actual BW as studied): 81ml/min  Plans for PO anticoagulation per cardiology (CHADS2Vasc Score = 2): Pharmacy to transition patient from Heparin to Xarelto  Xarelto started on 7/8, held for procedure on 7/9 and continued on 7/10.  Pt has tolerated therapy with no s/s bleeding  Goal of Therapy:  Dose appropriately for renal function and indication   Plan:   Continue rivaroxaban 20mg  daily with evening meal  Monitor for bleeding  Kizzie Furnish, PharmD Pager: [phone] 07/27/2013 10:56 AM

## 2013-07-27 NOTE — Progress Notes (Signed)
Subjective:  Still with some knee pain but overall gout is improving with less right knee pain. Breathing is better. Atrial fibrillation rate is better controlled   Objective:  Vital Signs in the last 24 hours: BP 104/51  Pulse 76  Temp(Src) 97.3 F (36.3 C) (Oral)  Resp 18  Ht 5\' 4"  (1.626 m)  Wt 78.2 kg (172 lb 6.4 oz)  BMI 29.58 kg/m2  SpO2 94%  Physical Exam: Pleasant female mildly obese in no acute distress Lungs:  Clear  Cardiac:  Irregular rhythm, normal S1 and S2, no S3 Abdomen:  Soft, nontender, no masses Extremities:  No edema present today  Intake/Output from previous day: 07/10 0701 - 07/11 0700 In: 240 [P.O.:240] Out: -  Weight Filed Weights   07/25/13 0610 07/26/13 0553 07/27/13 0440  Weight: 77.7 kg (171 lb 4.8 oz) 77.5 kg (170 lb 13.7 oz) 78.2 kg (172 lb 6.4 oz)    Lab Results: Basic Metabolic Panel:  Recent Labs  07/25/13 0448 07/26/13 0440  NA 140 140  K 3.7 3.9  CL 96 98  CO2 30 28  GLUCOSE 160* 158*  BUN 25* 29*  CREATININE 0.93 0.98    CBC:  Recent Labs  07/25/13 0448  WBC 21.6*  HGB 13.9  HCT 43.6  MCV 91.6  PLT 371    BNP    Component Value Date/Time   PROBNP 1013.0* 07/24/2013 0217    PROTIME: Lab Results  Component Value Date   INR 0.96 07/23/2013    Telemetry: Atrial fibrillation with controlled response  Assessment/Plan:  1. Atrial fibrillation rate now controlled 2. Acute on chronic diastolic heart failure improved 3. Acute gout improving  Recommendations:  Appears to be approaching maximum benefit in we'll probably be able to discharge her home in the morning. I will give a single dose of Solu-Medrol to try to help speed the resolution of the gout along. Hopefully discharge in morning.      Kerry Hough  MD [facility]  07/27/2013, 8:43 AM

## 2013-07-28 DIAGNOSIS — I119 Hypertensive heart disease without heart failure: Secondary | ICD-10-CM | POA: Diagnosis present

## 2013-07-28 DIAGNOSIS — Z7901 Long term (current) use of anticoagulants: Secondary | ICD-10-CM

## 2013-07-28 DIAGNOSIS — I5032 Chronic diastolic (congestive) heart failure: Secondary | ICD-10-CM | POA: Insufficient documentation

## 2013-07-28 DIAGNOSIS — E669 Obesity, unspecified: Secondary | ICD-10-CM

## 2013-07-28 DIAGNOSIS — I4891 Unspecified atrial fibrillation: Principal | ICD-10-CM | POA: Diagnosis present

## 2013-07-28 DIAGNOSIS — M109 Gout, unspecified: Secondary | ICD-10-CM

## 2013-07-28 LAB — BODY FLUID CULTURE
Culture: NO GROWTH
SPECIAL REQUESTS: NORMAL

## 2013-07-28 MED ORDER — COLCHICINE 0.6 MG PO TABS: 0.6000 mg | ORAL_TABLET | Freq: Every day | ORAL | Status: DC

## 2013-07-28 MED ORDER — FUROSEMIDE 20 MG PO TABS
20.0000 mg | ORAL_TABLET | Freq: Every day | ORAL | Status: DC
Start: 2013-07-28 — End: 2013-08-12

## 2013-07-28 MED ORDER — METOPROLOL TARTRATE 25 MG PO TABS: 25.0000 mg | ORAL_TABLET | Freq: Two times a day (BID) | ORAL | Status: DC

## 2013-07-28 MED ORDER — ALLOPURINOL 100 MG PO TABS: 100.0000 mg | ORAL_TABLET | Freq: Two times a day (BID) | ORAL | Status: DC

## 2013-07-28 MED ORDER — RIVAROXABAN 20 MG PO TABS: 20.0000 mg | ORAL_TABLET | Freq: Every day | ORAL | Status: DC

## 2013-07-28 MED ORDER — POTASSIUM CHLORIDE CRYS ER 20 MEQ PO TBCR: 20.0000 meq | EXTENDED_RELEASE_TABLET | Freq: Every day | ORAL | Status: DC

## 2013-07-28 NOTE — Discharge Summary (Signed)
Physician Discharge Summary  Patient ID: Cristina [name] MRN: [id number] DOB/AGE: 26-May-1940 73 y.o.  Admit date: 07/22/2013 Discharge date: 07/28/2013  Primary Physician:  Dr. Kathyrn Lass  Primary Discharge Diagnosis:   1. Acute diastolic congestive heart failure due to atrial fibrillation  Secondary Discharge Diagnosis: 2. Atrial fibrillation persistent now controlled 3. Hypertensive heart disease 4. Chronic diastolic heart failure 5. Acute gout 6. Obesity 7. Hypokalemia 8. Aortic atherosclerosis  Procedures:  2-D echocardiogram Aspiration of knee  Consults:  [facility],   Dr. Vonna Kotyk [facility] Course: This 73 year-old female has a history of asthma and hypertension. She presented to the hospital with a two-week history of lower trimming the edema weight gain and had increasing dyspnea on exertion. She had recently developed gout and had been to the emergency room several times for this and treated with prednisone and other agents. She came to the emergency room with worsening dyspnea and was found to be in atrial fibrillation with rapid response. She was started on intravenous diltiazem.  She was admitted and placed on heparin. She was later switched to [facility] for anticoagulation. Her heart rate came under control with the addition of beta blockers. She was hypokalemic on admission and this was repleted. She was later switched to oral Lasix after having been diuresed. She had an echocardiogram that showed concentric LVH with normal systolic function with focal basal septal hypertrophy, LVH, and an EF of around 65%. There was mild left and right atrial enlargement. She had recurrent pain involving her knee and was felt to have acute gout. This is confirmed on the aspiration. She was treated with colchicine  and was given a shot of intravenous steroids. Clinically better at the time of discharge and is discharged at this time in improved condition.  She'll  be continued on her verapamil that she was taking prior to coming in the hospital with the addition of a low-dose of beta blocker to help control atrial fibrillation. She will be anticoagulated with XARELTO may need to have cardioversion after 4 weeks of anticoagulation. She'll have early followup with her primary care physician as well as cardiology.  Discharge Exam: Blood pressure 136/76, pulse 83, temperature 97.6 F (36.4 C), temperature source Oral, resp. rate 16, height 5\' 4"  (1.626 m), weight 78.1 kg (172 lb 2.9 oz), SpO2 98.00%. Weight: 78.1 kg (172 lb 2.9 oz) Lungs clear, irregular rhythm,  Labs: CBC:   Lab Results  Component Value Date   WBC 21.6* 07/25/2013   HGB 13.9 07/25/2013   HCT 43.6 07/25/2013   MCV 91.6 07/25/2013   PLT 371 07/25/2013    CMP:  Recent Labs Lab 07/22/13 1651  07/26/13 0440  NA 136*  < > 140  K 3.8  < > 3.9  CL 97  < > 98  CO2 23  < > 28  BUN 26*  < > 29*  CREATININE 0.84  < > 0.98  CALCIUM 9.4  < > 9.8  PROT 6.4  --   --   BILITOT 0.4  --   --   ALKPHOS 117  --   --   ALT 18  --   --   AST 13  --   --   GLUCOSE 97  < > 158*  < > = values in this interval not displayed.  BNP (last 3 results)  Recent Labs  07/22/13 1710 07/24/13 0217  PROBNP 2246.0* 1013.0*   Thyroid: Lab Results  Component Value Date  TSH 2.980 07/23/2013   Hemoglobin A1C: Lab Results  Component Value Date   HGBA1C 6.0* 07/23/2013    Radiology: Cardiac enlargement with pulmonary vascular congestion  EKG: Atrial fibrillation with rapid response.  ECHO:  Study Conclusions  - Left ventricle: The cavity size was normal. Wall thickness was increased in a pattern of mild LVH. There was focal basal hypertrophy. Systolic function was vigorous. The estimated ejection fraction was in the range of 65% to 70%. Wall motion was normal; there were no regional wall motion abnormalities. - Left atrium: The atrium was mildly dilated. - Right atrium: The atrium was mildly  dilated.  Discharge Medications:   Medication List    STOP taking these medications       predniSONE 10 MG tablet  Commonly known as:  STERAPRED UNI-PAK     triamterene-hydrochlorothiazide 37.5-25 MG per capsule  Commonly known as:  DYAZIDE      TAKE these medications       allopurinol 100 MG tablet  Commonly known as:  ZYLOPRIM  Take 1 tablet (100 mg total) by mouth 2 (two) times daily.     colchicine 0.6 MG tablet  Take 1 tablet (0.6 mg total) by mouth daily.     furosemide 20 MG tablet  Commonly known as:  LASIX  Take 1 tablet (20 mg total) by mouth daily.     lisinopril 40 MG tablet  Commonly known as:  PRINIVIL,ZESTRIL  Take 80 mg by mouth every morning.     metoprolol tartrate 25 MG tablet  Commonly known as:  LOPRESSOR  Take 1 tablet (25 mg total) by mouth 2 (two) times daily.     naproxen sodium 220 MG tablet  Commonly known as:  ANAPROX  Take 220 mg by mouth 2 (two) times daily as needed (pain).     potassium chloride SA 20 MEQ tablet  Commonly known as:  K-DUR,KLOR-CON  Take 1 tablet (20 mEq total) by mouth daily.     pramipexole 1 MG tablet  Commonly known as:  MIRAPEX  Take 2 mg by mouth every morning.     rivaroxaban 20 MG Tabs tablet  Commonly known as:  XARELTO  Take 1 tablet (20 mg total) by mouth daily with supper.     verapamil 240 MG CR tablet  Commonly known as:  CALAN-SR  Take 240 mg by mouth at bedtime.       Followup plans and appointments: She is to followup with Dr. Sabra Heck within the next week and also should followup with Dr. Ron Parker and Dr. Mardelle Matte.  Time spent with patient to include physician time:  45 minutes  Signed: W. Doristine Church. MD [facility] 07/28/2013, 9:24 AM

## 2013-07-28 NOTE — Progress Notes (Signed)
I have left a message on our office's scheduling voicemail requesting a transition of care follow-up appointment, and our office will call the patient with this appointment. Tauni Sanks PA-C

## 2013-07-31 ENCOUNTER — Telehealth: Payer: Self-pay

## 2013-07-31 NOTE — Telephone Encounter (Signed)
Patient called for samples of xarelto placed samples up front 

## 2013-08-01 ENCOUNTER — Telehealth: Payer: Self-pay

## 2013-08-01 NOTE — Telephone Encounter (Signed)
Message copied by Bernita Raisin on Thu Aug 01, 2013 12:15 PM ------      Message from: Kate Sable A      Created: Tue Jul 23, 2013 10:07 PM       Please see if patient would like to f/u with me in [place] clinic. Thanks. I admitted her to [facility] with rapid atrial fibrillation and heart failure on 7/6. ------

## 2013-08-01 NOTE — Telephone Encounter (Signed)
I spoke with son in [place] 1 week ago and they were just discharging patient home that day.They were going to call back and scheduled follow up apt but I have not heard back.I have left messages at all numbers  Kerin Ransom front desk also is attempting to reach pt

## 2013-08-06 ENCOUNTER — Telehealth: Payer: Self-pay

## 2013-08-06 NOTE — Telephone Encounter (Signed)
Spoke with pt,she will make hospital fu at [facility] office as this is closer to her home.

## 2013-08-07 ENCOUNTER — Encounter: Payer: Self-pay | Admitting: Cardiology

## 2013-08-07 ENCOUNTER — Ambulatory Visit (INDEPENDENT_AMBULATORY_CARE_PROVIDER_SITE_OTHER): Payer: Medicare Other | Admitting: Cardiology

## 2013-08-07 VITALS — BP 110/70 | HR 81 | Ht 64.0 in | Wt 165.0 lb

## 2013-08-07 DIAGNOSIS — I4891 Unspecified atrial fibrillation: Secondary | ICD-10-CM

## 2013-08-07 DIAGNOSIS — I10 Essential (primary) hypertension: Secondary | ICD-10-CM

## 2013-08-07 DIAGNOSIS — I480 Paroxysmal atrial fibrillation: Secondary | ICD-10-CM

## 2013-08-07 DIAGNOSIS — I509 Heart failure, unspecified: Secondary | ICD-10-CM

## 2013-08-07 DIAGNOSIS — I5031 Acute diastolic (congestive) heart failure: Secondary | ICD-10-CM

## 2013-08-07 NOTE — Assessment & Plan Note (Signed)
Blood pressure controlled. Continue present medications. 

## 2013-08-07 NOTE — Assessment & Plan Note (Signed)
Patient remains in atrial fibrillation. Continue verapamil and metoprolol for rate control. Continue xarelto. We have given the name of apixaban to see if this is less expensive for her. She will need continued anticoagulation she has embolic risk factors of female sex, hypertension, age greater than 73 and CHF. Check hemoglobin and renal function. We discussed options of rate control versus rhythm control. She does have some fatigue. I would therefore favor an attempt at cardioversion. We will proceed after she has been fully anticoagulated for 4 weeks. Note echocardiogram with normal LV function and TSH normal.

## 2013-08-07 NOTE — Progress Notes (Signed)
HPI: 73 year old female for followup of atrial fibrillation. Patient admitted in July of 2015 with atrial fibrillation and CHF symptoms. Echocardiogram in July 2015 showed normal LV function and mild biatrial enlargement. TSH 2.980. Patient was treated with rate control and diuretics with improvement in symptoms. She was placed on xarelto. She also had aspiration of her knee and was felt to have gout. Since DC, She has mild dyspnea on exertion but much improved. No orthopnea, PND, pedal edema, palpitations, syncope or chest pain. She has been taking her xarelto for 8 days. She has some fatigue.  Current Outpatient Prescriptions  Medication Sig Dispense Refill  . allopurinol (ZYLOPRIM) 100 MG tablet Take 1 tablet (100 mg total) by mouth 2 (two) times daily.  60 tablet  12  . atorvastatin (LIPITOR) 20 MG tablet Take 20 mg by mouth daily.      . colchicine 0.6 MG tablet Take 1 tablet (0.6 mg total) by mouth daily.  30 tablet  12  . furosemide (LASIX) 20 MG tablet Take 1 tablet (20 mg total) by mouth daily.  30 tablet  12  . lisinopril (PRINIVIL,ZESTRIL) 40 MG tablet Take 80 mg by mouth every morning.      . metoprolol tartrate (LOPRESSOR) 25 MG tablet Take 1 tablet (25 mg total) by mouth 2 (two) times daily.  60 tablet  12  . naproxen sodium (ANAPROX) 220 MG tablet Take 440 mg by mouth 2 (two) times daily with a meal.      . potassium chloride SA (K-DUR,KLOR-CON) 20 MEQ tablet Take 1 tablet (20 mEq total) by mouth daily.  30 tablet  12  . pramipexole (MIRAPEX) 1 MG tablet Take 2 mg by mouth every morning.      . rivaroxaban (XARELTO) 20 MG TABS tablet Take 1 tablet (20 mg total) by mouth daily with supper.  30 tablet  12  . verapamil (CALAN-SR) 240 MG CR tablet Take 240 mg by mouth at bedtime.       No current facility-administered medications for this visit.     Past Medical History  Diagnosis Date  . Hypertension   . Asthma   . Atrial fibrillation     Past Surgical History    Procedure Laterality Date  . Back surgery      History   Social History  . Marital Status: Divorced    Spouse Name: N/A    Number of Children: N/A  . Years of Education: N/A   Occupational History  . Retired    Social History Main Topics  . Smoking status: Never Smoker   . Smokeless tobacco: Not on file  . Alcohol Use: Yes     Comment: occ  . Drug Use: Not on file  . Sexual Activity: Not on file   Other Topics Concern  . Not on file   Social History Narrative   Lives in [place]. No family history of premature coronary artery disease in either her parents or siblings of which she is aware.    ROS: no fevers or chills, productive cough, hemoptysis, dysphasia, odynophagia, melena, hematochezia, dysuria, hematuria, rash, seizure activity, orthopnea, PND, pedal edema, claudication. Remaining systems are negative.  Physical Exam: Well-developed well-nourished in no acute distress.  Skin is warm and dry.  HEENT is normal.  Neck is supple.  Chest is clear to auscultation with normal expansion.  Cardiovascular exam is irregular Abdominal exam nontender or distended. No masses palpated. Extremities show no edema. neuro grossly intact  ECG Atrial fibrillation at a rate of 81. No ST changes.

## 2013-08-07 NOTE — Patient Instructions (Signed)
Your physician recommends that you schedule a follow-up appointment in: [place]  Your physician recommends that you HAVE LAB [facility]  Your physician has recommended that you have a Cardioversion (DCCV). Electrical Cardioversion uses a jolt of electricity to your heart either through paddles or wired patches attached to your chest. This is a controlled, usually prescheduled, procedure. Defibrillation is done under light anesthesia in the hospital, and you usually go home the day of the procedure. This is done to get your heart back into a normal rhythm. You are not awake for the procedure. Please see the instruction sheet given to you today.    Electrical Cardioversion Electrical cardioversion is the delivery of a jolt of electricity to change the rhythm of the heart. Sticky patches or metal paddles are placed on the chest to deliver the electricity from a device. This is done to restore a normal rhythm. A rhythm that is too fast or not regular keeps the heart from pumping well. Electrical cardioversion is done in an emergency if:   There is low or no blood pressure as a result of the heart rhythm.   Normal rhythm must be restored as fast as possible to protect the brain and heart from further damage.   It may save a life. Cardioversion may be done for heart rhythms that are not immediately life-threatening, such as atrial fibrillation or flutter, in which:   The heart is beating too fast or is not regular.   Medicine to change the rhythm has not worked.   It is safe to wait in order to allow time for preparation.  Symptoms of the abnormal rhythm are bothersome.  The risk of stroke and other serious complications can be reduced. LET YOUR CAREGIVER KNOW ABOUT:   All medicines you are taking, including vitamins, herbs, eye drops, creams, and over-the-counter medicines.   Previous problems you or members of your family  have had with the use of anesthetics.   Any blood disorders you have.   Previous surgeries you have had.   Medical conditions you have. RISKS AND COMPLICATIONS  Generally, this is a safe procedure. However, as with any procedure, complications can occur. Possible complications include:   Breathing problems related to the anesthetic used.  Cardiac arrest--This risk is rare.  A blood clot that breaks free and travels to other parts of your body. This could cause a stroke or other problems. The risk of this is lowered by use of blood thinning medicine (anticoagulant) prior to the procedure. BEFORE THE PROCEDURE   You may have tests to detect blood clots in your heart and evaluate heart function.  You may start taking anticoagulants so your blood does not clot as easily.   Medicines may be given to help stabilize your heart rate and rhythm. PROCEDURE  You will be given medicine through an IV tube to reduce discomfort and make you sleepy (sedative).   An electrical shock will be delivered. AFTER THE PROCEDURE Your heart rhythm will be watched to make sure it does not change.You may be able to go home within a few hours.  Document Released: 12/24/2001 Document Revised: 10/24/2012 Document Reviewed: 07/18/2012 [facility] Patient Information 2015 [place], [place]. This information is not intended to replace advice given to you by your health care provider. Make sure you discuss any questions you have with your health care provider.

## 2013-08-07 NOTE — Assessment & Plan Note (Signed)
Volume status improved.Continue present dose of Lasix. Check potassium and renal function.

## 2013-08-08 LAB — BASIC METABOLIC PANEL WITH GFR
BUN: 48 mg/dL — ABNORMAL HIGH (ref 6–23)
CALCIUM: 10.1 mg/dL (ref 8.4–10.5)
CHLORIDE: 107 meq/L (ref 96–112)
CO2: 24 mEq/L (ref 19–32)
Creat: 1.21 mg/dL — ABNORMAL HIGH (ref 0.50–1.10)
GFR, Est African American: 51 mL/min — ABNORMAL LOW
GFR, Est Non African American: 44 mL/min — ABNORMAL LOW
GLUCOSE: 96 mg/dL (ref 70–99)
POTASSIUM: 4.2 meq/L (ref 3.5–5.3)
SODIUM: 142 meq/L (ref 135–145)

## 2013-08-08 LAB — CBC
HCT: 43.7 % (ref 36.0–46.0)
Hemoglobin: 14 g/dL (ref 12.0–15.0)
MCH: 28.3 pg (ref 26.0–34.0)
MCHC: 32 g/dL (ref 30.0–36.0)
MCV: 88.5 fL (ref 78.0–100.0)
PLATELETS: 359 10*3/uL (ref 150–400)
RBC: 4.94 MIL/uL (ref 3.87–5.11)
RDW: 14.4 % (ref 11.5–15.5)
WBC: 11.2 10*3/uL — AB (ref 4.0–10.5)

## 2013-08-12 ENCOUNTER — Telehealth: Payer: Self-pay | Admitting: *Deleted

## 2013-08-12 DIAGNOSIS — I5031 Acute diastolic (congestive) heart failure: Secondary | ICD-10-CM

## 2013-08-12 MED ORDER — FUROSEMIDE 20 MG PO TABS: 20.0000 mg | ORAL_TABLET | Freq: Every day | ORAL | Status: DC | PRN

## 2013-08-12 MED ORDER — POTASSIUM CHLORIDE CRYS ER 20 MEQ PO TBCR: 20.0000 meq | EXTENDED_RELEASE_TABLET | Freq: Every day | ORAL | Status: DC | PRN

## 2013-08-12 NOTE — Telephone Encounter (Signed)
Per patient's daughter Insurance says patient will need Prior Authorization for Xarelto to be covered.  Will forward message to nurse.

## 2013-08-14 ENCOUNTER — Telehealth: Payer: Self-pay

## 2013-08-14 NOTE — Telephone Encounter (Signed)
Patient called for samples of xarelto placed samples up front 

## 2013-08-20 LAB — BASIC METABOLIC PANEL WITH GFR
BUN: 21 mg/dL (ref 6–23)
CHLORIDE: 106 meq/L (ref 96–112)
CO2: 26 meq/L (ref 19–32)
Calcium: 9.1 mg/dL (ref 8.4–10.5)
Creat: 0.76 mg/dL (ref 0.50–1.10)
GFR, EST AFRICAN AMERICAN: 89 mL/min
GFR, Est Non African American: 78 mL/min
GLUCOSE: 96 mg/dL (ref 70–99)
POTASSIUM: 4.1 meq/L (ref 3.5–5.3)
SODIUM: 140 meq/L (ref 135–145)

## 2013-09-19 ENCOUNTER — Telehealth: Payer: Self-pay | Admitting: Cardiology

## 2013-09-19 NOTE — Telephone Encounter (Signed)
Spoke with pt, aware we are out of samples at this time. Will forward to the [address] location to see if they have samples. Please call the pt if there are samples at the [address] location

## 2013-09-19 NOTE — Telephone Encounter (Signed)
Pt called in stating that she is out of Xarelto and she would like some samples. She did not realize that she was on her last pill. Please call  thanks

## 2013-09-20 ENCOUNTER — Telehealth: Payer: Self-pay | Admitting: Cardiology

## 2013-09-20 MED ORDER — RIVAROXABAN 20 MG PO TABS: 20.0000 mg | ORAL_TABLET | Freq: Every day | ORAL | Status: DC

## 2013-09-20 NOTE — Telephone Encounter (Signed)
Calling to find out if we have anymore Xarelto Samples .[name] Kitchen States she spoke with you on yesterday . Please Call    Thanks

## 2013-09-20 NOTE — Telephone Encounter (Signed)
Prior auth complete for [employer]

## 2013-09-20 NOTE — Telephone Encounter (Signed)
Spoke with pt, aware samples are at the [address] location for pick up

## 2013-09-24 ENCOUNTER — Telehealth: Payer: Self-pay | Admitting: *Deleted

## 2013-09-24 NOTE — Telephone Encounter (Signed)
Prior authorization for xarelto 20mg  approved through 09/21/2014 under medicare part D

## 2013-10-17 ENCOUNTER — Ambulatory Visit (INDEPENDENT_AMBULATORY_CARE_PROVIDER_SITE_OTHER): Payer: Medicare Other | Admitting: Cardiology

## 2013-10-17 ENCOUNTER — Encounter: Payer: Self-pay | Admitting: Cardiology

## 2013-10-17 VITALS — BP 154/92 | HR 124 | Ht 64.0 in | Wt 179.2 lb

## 2013-10-17 DIAGNOSIS — I480 Paroxysmal atrial fibrillation: Secondary | ICD-10-CM

## 2013-10-17 DIAGNOSIS — I4891 Unspecified atrial fibrillation: Secondary | ICD-10-CM

## 2013-10-17 DIAGNOSIS — I10 Essential (primary) hypertension: Secondary | ICD-10-CM

## 2013-10-17 DIAGNOSIS — I5031 Acute diastolic (congestive) heart failure: Secondary | ICD-10-CM

## 2013-10-17 NOTE — Assessment & Plan Note (Signed)
Continue present dose of Lasix. Check potassium and renal function. 

## 2013-10-17 NOTE — Assessment & Plan Note (Signed)
Patient remains in atrial fibrillation. Continue verapamil and metoprolol for rate control. Her rate is elevated but she has not taken her metoprolol today. Continue xarelto. She will need continued anticoagulation as she has embolic risk factors of female sex, hypertension, age greater than 88 and CHF. Check hemoglobin.. We discussed options of rate control versus rhythm control. She does have some fatigue, dyspnea and pedal edema. I would therefore favor an attempt at cardioversion. We will proceed next week. Note previous echocardiogram with normal LV function and TSH normal.

## 2013-10-17 NOTE — Progress Notes (Signed)
HPI: FU atrial fibrillation. Patient admitted in July of 2015 with atrial fibrillation and CHF symptoms. Echocardiogram in July 2015 showed normal LV function and mild biatrial enlargement. TSH 2.980. Patient was treated with rate control and diuretics with improvement in symptoms. She was placed on xarelto. She also had aspiration of her knee and was felt to have gout. At last OV, DCCV planned but has not been performed. Since last seen, She has some dyspnea on exertion but no orthopnea or PND. Occasional pedal edema. No chest pain, palpitations or syncope.   Current Outpatient Prescriptions  Medication Sig Dispense Refill  . allopurinol (ZYLOPRIM) 100 MG tablet Take 1 tablet (100 mg total) by mouth 2 (two) times daily.  60 tablet  12  . atorvastatin (LIPITOR) 20 MG tablet Take 20 mg by mouth daily.      . colchicine 0.6 MG tablet Take 1 tablet (0.6 mg total) by mouth daily.  30 tablet  12  . furosemide (LASIX) 20 MG tablet Take 1 tablet (20 mg total) by mouth daily as needed.  30 tablet  12  . lisinopril (PRINIVIL,ZESTRIL) 40 MG tablet Take 80 mg by mouth every morning.      . metoprolol tartrate (LOPRESSOR) 25 MG tablet Take 1 tablet (25 mg total) by mouth 2 (two) times daily.  60 tablet  12  . naproxen sodium (ANAPROX) 220 MG tablet Take 440 mg by mouth 2 (two) times daily with a meal.      . potassium chloride SA (K-DUR,KLOR-CON) 20 MEQ tablet Take 1 tablet (20 mEq total) by mouth daily as needed.  30 tablet  12  . pramipexole (MIRAPEX) 1 MG tablet Take 2 mg by mouth every morning.      . rivaroxaban (XARELTO) 20 MG TABS tablet Take 1 tablet (20 mg total) by mouth daily with supper.  90 tablet  2  . verapamil (CALAN-SR) 240 MG CR tablet Take 240 mg by mouth at bedtime.       No current facility-administered medications for this visit.     Past Medical History  Diagnosis Date  . Hypertension   . Asthma   . Atrial fibrillation     Past Surgical History  Procedure Laterality  Date  . Back surgery      History   Social History  . Marital Status: Divorced    Spouse Name: N/A    Number of Children: N/A  . Years of Education: N/A   Occupational History  . Retired    Social History Main Topics  . Smoking status: Never Smoker   . Smokeless tobacco: Never Used  . Alcohol Use: Yes     Comment: occ  . Drug Use: Not on file  . Sexual Activity: Not on file   Other Topics Concern  . Not on file   Social History Narrative   Lives in [place]. No family history of premature coronary artery disease in either her parents or siblings of which she is aware.    ROS: no fevers or chills, productive cough, hemoptysis, dysphasia, odynophagia, melena, hematochezia, dysuria, hematuria, rash, seizure activity, orthopnea, PND, claudication. Remaining systems are negative.  Physical Exam: Well-developed well-nourished in no acute distress.  Skin is warm and dry.  HEENT is normal.  Neck is supple.  Chest is clear to auscultation with normal expansion.  Cardiovascular exam tachycardic and irregular Abdominal exam nontender or distended. No masses palpated. Extremities show trace edema. neuro grossly intact  ECG Atrial fibrillation  at a rate of 124. Right axis deviation. No ST changes.

## 2013-10-17 NOTE — Assessment & Plan Note (Signed)
Continue present blood pressure medications. 

## 2013-10-17 NOTE — Patient Instructions (Signed)
Your physician recommends that you schedule a follow-up appointment in: [place] has recommended that you have a Cardioversion (DCCV). Electrical Cardioversion uses a jolt of electricity to your heart either through paddles or wired patches attached to your chest. This is a controlled, usually prescheduled, procedure. Defibrillation is done under light anesthesia in the hospital, and you usually go home the day of the procedure. This is done to get your heart back into a normal rhythm. You are not awake for the procedure. Please see the instruction sheet given to you today.   Your physician recommends that you return for lab work NEXT WEEK

## 2013-10-22 ENCOUNTER — Other Ambulatory Visit: Payer: Self-pay | Admitting: Cardiology

## 2013-10-22 DIAGNOSIS — I480 Paroxysmal atrial fibrillation: Secondary | ICD-10-CM

## 2013-10-23 LAB — BASIC METABOLIC PANEL WITH GFR
BUN: 18 mg/dL (ref 6–23)
CHLORIDE: 105 meq/L (ref 96–112)
CO2: 29 meq/L (ref 19–32)
Calcium: 9.4 mg/dL (ref 8.4–10.5)
Creat: 0.86 mg/dL (ref 0.50–1.10)
GFR, EST NON AFRICAN AMERICAN: 67 mL/min
GFR, Est African American: 77 mL/min
Glucose, Bld: 77 mg/dL (ref 70–99)
POTASSIUM: 3.8 meq/L (ref 3.5–5.3)
Sodium: 144 mEq/L (ref 135–145)

## 2013-10-23 LAB — CBC
HEMATOCRIT: 39.3 % (ref 36.0–46.0)
HEMOGLOBIN: 12.9 g/dL (ref 12.0–15.0)
MCH: 29.5 pg (ref 26.0–34.0)
MCHC: 32.8 g/dL (ref 30.0–36.0)
MCV: 89.9 fL (ref 78.0–100.0)
Platelets: 271 10*3/uL (ref 150–400)
RBC: 4.37 MIL/uL (ref 3.87–5.11)
RDW: 16.3 % — ABNORMAL HIGH (ref 11.5–15.5)
WBC: 10 10*3/uL (ref 4.0–10.5)

## 2013-10-24 ENCOUNTER — Encounter (HOSPITAL_COMMUNITY): Payer: Self-pay | Admitting: Pharmacy Technician

## 2013-10-25 ENCOUNTER — Ambulatory Visit (HOSPITAL_COMMUNITY)
Admission: RE | Admit: 2013-10-25 | Discharge: 2013-10-25 | Disposition: A | Discharge status: Home / Self Care | Payer: Medicare Other | Source: Ambulatory Visit | Attending: Cardiology | Admitting: Cardiology

## 2013-10-25 ENCOUNTER — Encounter (HOSPITAL_COMMUNITY)
Admission: RE | Disposition: A | Discharge status: Home / Self Care | Payer: Self-pay | Source: Ambulatory Visit | Attending: Cardiology

## 2013-10-25 DIAGNOSIS — I4891 Unspecified atrial fibrillation: Secondary | ICD-10-CM | POA: Insufficient documentation

## 2013-10-25 DIAGNOSIS — Z539 Procedure and treatment not carried out, unspecified reason: Secondary | ICD-10-CM | POA: Insufficient documentation

## 2013-10-25 DIAGNOSIS — I10 Essential (primary) hypertension: Secondary | ICD-10-CM | POA: Insufficient documentation

## 2013-10-25 DIAGNOSIS — I5031 Acute diastolic (congestive) heart failure: Secondary | ICD-10-CM | POA: Diagnosis not present

## 2013-10-25 DIAGNOSIS — J45909 Unspecified asthma, uncomplicated: Secondary | ICD-10-CM | POA: Diagnosis not present

## 2013-10-25 DIAGNOSIS — Z79899 Other long term (current) drug therapy: Secondary | ICD-10-CM | POA: Diagnosis not present

## 2013-10-25 SURGERY — CANCELLED PROCEDURE

## 2013-10-25 MED ORDER — SODIUM CHLORIDE 0.9 % IV SOLN: INTRAVENOUS | Status: DC

## 2013-10-25 NOTE — H&P (Addendum)
Cristina Cristina  10/17/2013 4:15 PM   Office Visit  MRN:  [id number]   Description: Female DOB: [birth year]  Provider: Lelon Perla, MD  Department: [facility]          Vital Signs Most recent update: 10/17/2013  4:43 PM by Dionne Bucy Truitt, CMA      BP Pulse Ht Wt BMI      154/92 124 5\' 4"  (1.626 m) 179 lb 3.2 oz (81.285 kg) 30.74 kg/m2        Vitals History Recorded                Progress Notes      Lelon Perla, MD at 10/17/2013  1:29 PM      Status: Signed               HPI: FU atrial fibrillation. Patient admitted in July of 2015 with atrial fibrillation and CHF symptoms. Echocardiogram in July 2015 showed normal LV function and mild biatrial enlargement. TSH 2.980. Patient was treated with rate control and diuretics with improvement in symptoms. She was placed on xarelto. She also had aspiration of her knee and was felt to have gout. At last OV, DCCV planned but has not been performed. Since last seen, She has some dyspnea on exertion but no orthopnea or PND. Occasional pedal edema. No chest pain, palpitations or syncope.      Current Outpatient Prescriptions   Medication  Sig  Dispense  Refill   .  allopurinol (ZYLOPRIM) 100 MG tablet  Take 1 tablet (100 mg total) by mouth 2 (two) times daily.   60 tablet   12   .  atorvastatin (LIPITOR) 20 MG tablet  Take 20 mg by mouth daily.         .  colchicine 0.6 MG tablet  Take 1 tablet (0.6 mg total) by mouth daily.   30 tablet   12   .  furosemide (LASIX) 20 MG tablet  Take 1 tablet (20 mg total) by mouth daily as needed.   30 tablet   12   .  lisinopril (PRINIVIL,ZESTRIL) 40 MG tablet  Take 80 mg by mouth every morning.         .  metoprolol tartrate (LOPRESSOR) 25 MG tablet  Take 1 tablet (25 mg total) by mouth 2 (two) times daily.   60 tablet   12   .  naproxen sodium (ANAPROX) 220 MG tablet  Take 440 mg by mouth 2 (two) times daily with a meal.         .  potassium chloride SA (K-DUR,KLOR-CON) 20 MEQ tablet  Take  1 tablet (20 mEq total) by mouth daily as needed.   30 tablet   12   .  pramipexole (MIRAPEX) 1 MG tablet  Take 2 mg by mouth every morning.         .  rivaroxaban (XARELTO) 20 MG TABS tablet  Take 1 tablet (20 mg total) by mouth daily with supper.   90 tablet   2   .  verapamil (CALAN-SR) 240 MG CR tablet  Take 240 mg by mouth at bedtime.             No current facility-administered medications for this visit.           Past Medical History   Diagnosis  Date   .  Hypertension     .  Asthma     .  Atrial fibrillation  Past Surgical History   Procedure  Laterality  Date   .  Back surgery             History       Social History   .  Marital Status:  Divorced       Spouse Name:  N/A       Number of Children:  N/A   .  Years of Education:  N/A       Occupational History   .  Retired         Social History Main Topics   .  Smoking status:  Never Smoker    .  Smokeless tobacco:  Never Used   .  Alcohol Use:  Yes         Comment: occ   .  Drug Use:  Not on file   .  Sexual Activity:  Not on file       Other Topics  Concern   .  Not on file       Social History Narrative     Lives in [place]. No family history of premature coronary artery disease in either her parents or siblings of which she is aware.        ROS: no fevers or chills, productive cough, hemoptysis, dysphasia, odynophagia, melena, hematochezia, dysuria, hematuria, rash, seizure activity, orthopnea, PND, claudication. Remaining systems are negative.   Physical Exam: Well-developed well-nourished in no acute distress.   Skin is warm and dry.   HEENT is normal.   Neck is supple.   Chest is clear to auscultation with normal expansion.   Cardiovascular exam tachycardic and irregular Abdominal exam nontender or distended. No masses palpated. Extremities show trace edema. neuro grossly intact   ECG Atrial fibrillation at a rate of 124. Right axis deviation. No ST  changes.                  Acute diastolic CHF (congestive heart failure) - Lelon Perla, MD at 10/17/2013  5:13 PM      Status: Written Related Problem: Acute diastolic CHF (congestive heart failure)    Continue present dose of Lasix. Check potassium and renal function.         Atrial fibrillation - Lelon Perla, MD at 10/17/2013  5:15 PM      Status: Written Related Problem: Atrial fibrillation    Patient remains in atrial fibrillation. Continue verapamil and metoprolol for rate control. Her rate is elevated but she has not taken her metoprolol today. Continue xarelto. She will need continued anticoagulation as she has embolic risk factors of female sex, hypertension, age greater than 31 and CHF. Check hemoglobin.. We discussed options of rate control versus rhythm control. She does have some fatigue, dyspnea and pedal edema. I would therefore favor an attempt at cardioversion. We will proceed next week. Note previous echocardiogram with normal LV function and TSH normal.            Essential hypertension - Lelon Perla, MD at 10/17/2013  5:15 PM      Status: Written Related Problem: Essential hypertension    Continue present blood pressure medications.    For DCCV; patient in sinus; procedure cancelled. Continue present meds  Cristina Cristina

## 2013-11-06 ENCOUNTER — Telehealth: Payer: Self-pay | Admitting: Cardiology

## 2013-11-06 MED ORDER — RIVAROXABAN 20 MG PO TABS: 20.0000 mg | ORAL_TABLET | Freq: Every day | ORAL | Status: DC

## 2013-11-06 NOTE — Telephone Encounter (Signed)
Pt called in stating that she will be leaving to go out of town for a while and she is almost out of Xarelto. She would like to know if she could get some samples. Please call  thanks

## 2013-11-06 NOTE — Telephone Encounter (Signed)
3 SAMPLE BOTTLES  OF Xarelto GIVEN. LEFT MESSAGE ON PATIENT'S VOICEMAIL.

## 2013-11-28 ENCOUNTER — Other Ambulatory Visit: Payer: Self-pay | Admitting: Cardiology

## 2013-11-28 NOTE — Telephone Encounter (Signed)
Cristina [name] is calling to see if she can get some samples of Xarelto  . Please call

## 2013-11-29 MED ORDER — RIVAROXABAN 20 MG PO TABS: 20.0000 mg | ORAL_TABLET | Freq: Every day | ORAL | Status: DC

## 2013-11-29 NOTE — Telephone Encounter (Signed)
Left message on voice mail. Samples are available 4-bottles.

## 2013-12-27 ENCOUNTER — Telehealth: Payer: Self-pay | Admitting: Cardiology

## 2013-12-27 NOTE — Telephone Encounter (Signed)
Pt called in stating that she was out of her Xarelto and would like to know if she could receive more samples. Please call  Thanks

## 2013-12-27 NOTE — Telephone Encounter (Signed)
Called patient and let her know that we do not have any samples at this time.Let her know that she could try  [facility] [place] that they may have some avb.  Patient stated that she would try them if not she would just not take it until some one gave her samples. Rx refill avb but patient doesn't not was to pay the cost at this time, states it is too expensive.

## 2014-01-23 ENCOUNTER — Emergency Department (HOSPITAL_COMMUNITY): Payer: Medicare Other

## 2014-01-23 ENCOUNTER — Inpatient Hospital Stay (HOSPITAL_COMMUNITY)
Admission: EM | Admit: 2014-01-23 | Discharge: 2014-01-24 | DRG: 915 | Disposition: A | Discharge status: Home / Self Care | Payer: Medicare Other | Attending: Internal Medicine | Admitting: Internal Medicine

## 2014-01-23 ENCOUNTER — Encounter (HOSPITAL_COMMUNITY): Payer: Self-pay | Admitting: Emergency Medicine

## 2014-01-23 DIAGNOSIS — J690 Pneumonitis due to inhalation of food and vomit: Secondary | ICD-10-CM | POA: Diagnosis present

## 2014-01-23 DIAGNOSIS — Z7189 Other specified counseling: Secondary | ICD-10-CM | POA: Diagnosis not present

## 2014-01-23 DIAGNOSIS — N179 Acute kidney failure, unspecified: Secondary | ICD-10-CM | POA: Diagnosis present

## 2014-01-23 DIAGNOSIS — T782XXA Anaphylactic shock, unspecified, initial encounter: Secondary | ICD-10-CM

## 2014-01-23 DIAGNOSIS — T886XXA Anaphylactic reaction due to adverse effect of correct drug or medicament properly administered, initial encounter: Secondary | ICD-10-CM | POA: Diagnosis present

## 2014-01-23 DIAGNOSIS — Z8589 Personal history of malignant neoplasm of other organs and systems: Secondary | ICD-10-CM

## 2014-01-23 DIAGNOSIS — I10 Essential (primary) hypertension: Secondary | ICD-10-CM | POA: Diagnosis present

## 2014-01-23 DIAGNOSIS — J45909 Unspecified asthma, uncomplicated: Secondary | ICD-10-CM | POA: Diagnosis present

## 2014-01-23 DIAGNOSIS — J320 Chronic maxillary sinusitis: Secondary | ICD-10-CM | POA: Diagnosis present

## 2014-01-23 DIAGNOSIS — Z515 Encounter for palliative care: Secondary | ICD-10-CM | POA: Diagnosis not present

## 2014-01-23 DIAGNOSIS — L03211 Cellulitis of face: Secondary | ICD-10-CM | POA: Diagnosis present

## 2014-01-23 DIAGNOSIS — I4891 Unspecified atrial fibrillation: Secondary | ICD-10-CM | POA: Diagnosis present

## 2014-01-23 DIAGNOSIS — R1310 Dysphagia, unspecified: Secondary | ICD-10-CM | POA: Diagnosis present

## 2014-01-23 DIAGNOSIS — Y9289 Other specified places as the place of occurrence of the external cause: Secondary | ICD-10-CM

## 2014-01-23 DIAGNOSIS — T50995A Adverse effect of other drugs, medicaments and biological substances, initial encounter: Secondary | ICD-10-CM | POA: Diagnosis present

## 2014-01-23 DIAGNOSIS — T360X5A Adverse effect of penicillins, initial encounter: Secondary | ICD-10-CM | POA: Diagnosis present

## 2014-01-23 DIAGNOSIS — I5032 Chronic diastolic (congestive) heart failure: Secondary | ICD-10-CM

## 2014-01-23 DIAGNOSIS — I700 Atherosclerosis of aorta: Secondary | ICD-10-CM | POA: Diagnosis present

## 2014-01-23 DIAGNOSIS — T783XXA Angioneurotic edema, initial encounter: Secondary | ICD-10-CM | POA: Diagnosis present

## 2014-01-23 DIAGNOSIS — Z792 Long term (current) use of antibiotics: Secondary | ICD-10-CM | POA: Diagnosis not present

## 2014-01-23 DIAGNOSIS — M1000 Idiopathic gout, unspecified site: Secondary | ICD-10-CM | POA: Diagnosis present

## 2014-01-23 DIAGNOSIS — R609 Edema, unspecified: Secondary | ICD-10-CM | POA: Diagnosis present

## 2014-01-23 DIAGNOSIS — Z6830 Body mass index (BMI) 30.0-30.9, adult: Secondary | ICD-10-CM | POA: Diagnosis not present

## 2014-01-23 DIAGNOSIS — G2581 Restless legs syndrome: Secondary | ICD-10-CM | POA: Diagnosis present

## 2014-01-23 DIAGNOSIS — R221 Localized swelling, mass and lump, neck: Secondary | ICD-10-CM

## 2014-01-23 DIAGNOSIS — I5030 Unspecified diastolic (congestive) heart failure: Secondary | ICD-10-CM | POA: Diagnosis present

## 2014-01-23 DIAGNOSIS — R05 Cough: Secondary | ICD-10-CM

## 2014-01-23 DIAGNOSIS — R059 Cough, unspecified: Secondary | ICD-10-CM

## 2014-01-23 DIAGNOSIS — R0602 Shortness of breath: Secondary | ICD-10-CM

## 2014-01-23 DIAGNOSIS — J9601 Acute respiratory failure with hypoxia: Secondary | ICD-10-CM | POA: Diagnosis not present

## 2014-01-23 DIAGNOSIS — R001 Bradycardia, unspecified: Secondary | ICD-10-CM | POA: Diagnosis not present

## 2014-01-23 LAB — BASIC METABOLIC PANEL
Anion gap: 4 — ABNORMAL LOW (ref 5–15)
BUN: 25 mg/dL — ABNORMAL HIGH (ref 6–23)
CALCIUM: 8.9 mg/dL (ref 8.4–10.5)
CO2: 27 mmol/L (ref 19–32)
CREATININE: 0.87 mg/dL (ref 0.50–1.10)
Chloride: 104 mEq/L (ref 96–112)
GFR calc Af Amer: 74 mL/min — ABNORMAL LOW (ref >90)
GFR, EST NON AFRICAN AMERICAN: 64 mL/min — AB (ref >90)
GLUCOSE: 110 mg/dL — AB (ref 70–99)
Potassium: 4 mmol/L (ref 3.5–5.1)
Sodium: 135 mmol/L (ref 135–145)

## 2014-01-23 LAB — CBC WITH DIFFERENTIAL/PLATELET
BASOS ABS: 0.1 10*3/uL (ref 0.0–0.1)
BASOS PCT: 1 % (ref 0–1)
EOS PCT: 3 % (ref 0–5)
Eosinophils Absolute: 0.5 10*3/uL (ref 0.0–0.7)
HCT: 47.3 % — ABNORMAL HIGH (ref 36.0–46.0)
Hemoglobin: 15.5 g/dL — ABNORMAL HIGH (ref 12.0–15.0)
LYMPHS ABS: 1.7 10*3/uL (ref 0.7–4.0)
Lymphocytes Relative: 11 % — ABNORMAL LOW (ref 12–46)
MCH: 30.5 pg (ref 26.0–34.0)
MCHC: 32.8 g/dL (ref 30.0–36.0)
MCV: 92.9 fL (ref 78.0–100.0)
MONO ABS: 1.6 10*3/uL — AB (ref 0.1–1.0)
Monocytes Relative: 10 % (ref 3–12)
NEUTROS ABS: 11.9 10*3/uL — AB (ref 1.7–7.7)
Neutrophils Relative %: 75 % (ref 43–77)
Platelets: 281 10*3/uL (ref 150–400)
RBC: 5.09 MIL/uL (ref 3.87–5.11)
RDW: 14.6 % (ref 11.5–15.5)
WBC: 15.8 10*3/uL — ABNORMAL HIGH (ref 4.0–10.5)

## 2014-01-23 LAB — BRAIN NATRIURETIC PEPTIDE: B NATRIURETIC PEPTIDE 5: 208.1 pg/mL — AB (ref 0.0–100.0)

## 2014-01-23 LAB — RAPID STREP SCREEN (MED CTR MEBANE ONLY): STREPTOCOCCUS, GROUP A SCREEN (DIRECT): NEGATIVE

## 2014-01-23 LAB — D-DIMER, QUANTITATIVE: D-Dimer, Quant: 1.37 ug/mL-FEU — ABNORMAL HIGH (ref 0.00–0.48)

## 2014-01-23 MED ORDER — ALBUTEROL SULFATE HFA 108 (90 BASE) MCG/ACT IN AERS: 2.0000 | INHALATION_SPRAY | Freq: Four times a day (QID) | RESPIRATORY_TRACT | Status: DC | PRN

## 2014-01-23 MED ORDER — SODIUM CHLORIDE 0.9 % IV BOLUS (SEPSIS)
500.0000 mL | Freq: Once | INTRAVENOUS | Status: AC
  Administered 2014-01-23: 500 mL via INTRAVENOUS

## 2014-01-23 MED ORDER — ALBUTEROL SULFATE (2.5 MG/3ML) 0.083% IN NEBU: 2.5000 mg | INHALATION_SOLUTION | Freq: Four times a day (QID) | RESPIRATORY_TRACT | Status: DC | PRN

## 2014-01-23 MED ORDER — VERAPAMIL HCL ER 240 MG PO TBCR
240.0000 mg | EXTENDED_RELEASE_TABLET | Freq: Every day | ORAL | Status: DC
  Administered 2014-01-23: 240 mg via ORAL
  Filled 2014-01-23: qty 1

## 2014-01-23 MED ORDER — METOPROLOL TARTRATE 25 MG PO TABS
25.0000 mg | ORAL_TABLET | Freq: Two times a day (BID) | ORAL | Status: DC
  Administered 2014-01-23 – 2014-01-24 (×2): 25 mg via ORAL
  Filled 2014-01-23 (×2): qty 1

## 2014-01-23 MED ORDER — IOHEXOL 350 MG/ML SOLN
100.0000 mL | Freq: Once | INTRAVENOUS | Status: AC | PRN
  Administered 2014-01-23: 100 mL via INTRAVENOUS

## 2014-01-23 MED ORDER — ACETAMINOPHEN 325 MG PO TABS
650.0000 mg | ORAL_TABLET | Freq: Once | ORAL | Status: AC
  Administered 2014-01-23: 650 mg via ORAL
  Filled 2014-01-23: qty 2

## 2014-01-23 MED ORDER — COLCHICINE 0.6 MG PO TABS
0.6000 mg | ORAL_TABLET | Freq: Every day | ORAL | Status: DC
  Administered 2014-01-23 – 2014-01-24 (×2): 0.6 mg via ORAL
  Filled 2014-01-23 (×2): qty 1

## 2014-01-23 MED ORDER — PRAMIPEXOLE DIHYDROCHLORIDE 1 MG PO TABS
2.0000 mg | ORAL_TABLET | Freq: Every day | ORAL | Status: DC
  Administered 2014-01-23: 2 mg via ORAL
  Filled 2014-01-23: qty 2

## 2014-01-23 MED ORDER — SODIUM CHLORIDE 0.9 % IJ SOLN
3.0000 mL | Freq: Two times a day (BID) | INTRAMUSCULAR | Status: DC
  Administered 2014-01-23 – 2014-01-24 (×2): 3 mL via INTRAVENOUS

## 2014-01-23 MED ORDER — DIPHENHYDRAMINE HCL 50 MG/ML IJ SOLN
25.0000 mg | Freq: Once | INTRAMUSCULAR | Status: AC
  Administered 2014-01-23: 25 mg via INTRAVENOUS
  Filled 2014-01-23: qty 1

## 2014-01-23 MED ORDER — DIPHENHYDRAMINE HCL 50 MG/ML IJ SOLN
25.0000 mg | Freq: Three times a day (TID) | INTRAMUSCULAR | Status: AC
  Administered 2014-01-23 – 2014-01-24 (×3): 25 mg via INTRAVENOUS
  Filled 2014-01-23: qty 0.5
  Filled 2014-01-23 (×2): qty 1

## 2014-01-23 MED ORDER — RIVAROXABAN 20 MG PO TABS
20.0000 mg | ORAL_TABLET | Freq: Every day | ORAL | Status: DC
  Administered 2014-01-23: 20 mg via ORAL
  Filled 2014-01-23 (×2): qty 1

## 2014-01-23 MED ORDER — METHYLPREDNISOLONE SODIUM SUCC 125 MG IJ SOLR
60.0000 mg | Freq: Four times a day (QID) | INTRAMUSCULAR | Status: DC
  Administered 2014-01-23 – 2014-01-24 (×4): 60 mg via INTRAVENOUS
  Filled 2014-01-23 (×4): qty 0.96
  Filled 2014-01-23: qty 2

## 2014-01-23 MED ORDER — SODIUM CHLORIDE 0.9 % IV SOLN
INTRAVENOUS | Status: DC
  Administered 2014-01-23: 21:00:00 via INTRAVENOUS

## 2014-01-23 MED ORDER — ALUM & MAG HYDROXIDE-SIMETH 200-200-20 MG/5ML PO SUSP: 30.0000 mL | Freq: Four times a day (QID) | ORAL | Status: DC | PRN

## 2014-01-23 MED ORDER — FAMOTIDINE IN NACL 20-0.9 MG/50ML-% IV SOLN
20.0000 mg | Freq: Once | INTRAVENOUS | Status: AC
  Administered 2014-01-23: 20 mg via INTRAVENOUS
  Filled 2014-01-23: qty 50

## 2014-01-23 MED ORDER — FAMOTIDINE IN NACL 20-0.9 MG/50ML-% IV SOLN: 20.0000 mg | Freq: Once | INTRAVENOUS | Status: DC

## 2014-01-23 MED ORDER — EPINEPHRINE 0.3 MG/0.3ML IJ SOAJ
0.3000 mg | Freq: Once | INTRAMUSCULAR | Status: AC
  Administered 2014-01-23: 0.3 mg via INTRAMUSCULAR
  Filled 2014-01-23: qty 0.3

## 2014-01-23 MED ORDER — POTASSIUM CHLORIDE CRYS ER 20 MEQ PO TBCR
20.0000 meq | EXTENDED_RELEASE_TABLET | Freq: Every day | ORAL | Status: DC
  Administered 2014-01-23 – 2014-01-24 (×2): 20 meq via ORAL
  Filled 2014-01-23 (×2): qty 1

## 2014-01-23 MED ORDER — FUROSEMIDE 40 MG PO TABS
20.0000 mg | ORAL_TABLET | Freq: Every day | ORAL | Status: DC
  Administered 2014-01-23: 20 mg via ORAL
  Filled 2014-01-23: qty 1

## 2014-01-23 MED ORDER — METOPROLOL TARTRATE 25 MG PO TABS
25.0000 mg | ORAL_TABLET | Freq: Once | ORAL | Status: AC
  Administered 2014-01-23: 25 mg via ORAL
  Filled 2014-01-23: qty 1

## 2014-01-23 MED ORDER — ALLOPURINOL 100 MG PO TABS
100.0000 mg | ORAL_TABLET | Freq: Two times a day (BID) | ORAL | Status: DC
  Administered 2014-01-23 – 2014-01-24 (×2): 100 mg via ORAL
  Filled 2014-01-23 (×2): qty 1

## 2014-01-23 MED ORDER — ALPRAZOLAM 0.5 MG PO TABS
0.5000 mg | ORAL_TABLET | Freq: Once | ORAL | Status: AC
  Administered 2014-01-24: 0.5 mg via ORAL
  Filled 2014-01-23: qty 1

## 2014-01-23 MED ORDER — METHYLPREDNISOLONE SODIUM SUCC 125 MG IJ SOLR
125.0000 mg | Freq: Once | INTRAMUSCULAR | Status: AC
  Administered 2014-01-23: 125 mg via INTRAVENOUS
  Filled 2014-01-23: qty 2

## 2014-01-23 MED ORDER — FAMOTIDINE IN NACL 20-0.9 MG/50ML-% IV SOLN
20.0000 mg | Freq: Two times a day (BID) | INTRAVENOUS | Status: DC
  Administered 2014-01-23 – 2014-01-24 (×2): 20 mg via INTRAVENOUS
  Filled 2014-01-23 (×3): qty 50

## 2014-01-23 MED ORDER — HEPARIN SODIUM (PORCINE) 5000 UNIT/ML IJ SOLN
5000.0000 [IU] | Freq: Three times a day (TID) | INTRAMUSCULAR | Status: DC
  Filled 2014-01-23: qty 1

## 2014-01-23 MED ORDER — DEXTROSE 5 % IV SOLN
2.0000 g | Freq: Once | INTRAVENOUS | Status: AC
  Administered 2014-01-23: 2 g via INTRAVENOUS
  Filled 2014-01-23: qty 2

## 2014-01-23 NOTE — H&P (Addendum)
[facility] History and Physical  Cristina [name] TIW:[id number] DOB: 1940/06/25 DOA: 01/23/2014  Referring physician: ed PCP: Tawanna Solo, MD  Specialists: ent  Chief Complaint: throat swelling  HPI:  Cristina [name] 89 ? chronic Afib CHad2Vasc2 score~4-5 on eliquis, Asthma since Age 74,? Unclear diastolic CHF on ECHO 02/22/03,  Gouty arthritis, Aortic Atherosclerosis, Morbid obesity, Body mass index is 30.37 kg/(m^2)., possible seasonal allergies Started feeling poorly !12.25.15-she states she started having upper respiratory symptoms Mucinex, Sucrets as well as Zyrtec. She started to feel worse and decided on 01/15/14 to go see her physician. She was prescribed a burst of prednisone, doxycycline and an inhaler and then developed over the course of time from the 30th until 01/20/14 progressive facial swelling and discomfort in her neck and a worsening cold. She has felt stopped up and has discomfort in her neck. Her face on the morning of admission 01/23/14 was completely swollen red and erythematous. She also had increasing wheezing and seemed to be gasping according to her daughter and was brought directly to the emergency room by the daughter. She denies any diarrhea or any hematemesis. She states that she has some facial pain and feels a little congested in her ears here just has not been diagnosed with any seasonal allergies in the past but thinks that this is how her current symptomatology and issues seem to have started. She might have been exposed to sick contacts prior to admission around the holidays as all of her family lives here  She denies any chest pain currently she is not able to tell that she is in rapid A. fib and her heart rate on admission was in the 120s. She has taken her medications this morning but did not take them last night for her rate control. She currently lives at home and is completely independent able to do all things for herself.  Because of increased work of  breathing as well as trouble speaking and tongue swelling patient was urgently reevaluated in the emergency room without any pharyngeal edema or any voice change she was given IV epinephrine IV steroids IV Pepcid and on reexam to the emergency physician face did not appear significantly swollen A CT scan of the head neck and chest was performed showing 2 left upper lobe nodules and mucosal edema and it was thought that she might have infectious epiglottitis and 2 g of IV Rocephin were given for temperature 100.7.  Rapid strep was negative, strep culture pending BNP 208 BUN/creatinine 25/0.87 WBC 15.8    Review of Systems: The patient denies any other organ system findings. Rest of review systems and other issues are negative.  Past Medical History  Diagnosis Date  . Hypertension   . Asthma   . Atrial fibrillation    Past Surgical History  Procedure Laterality Date  . Back surgery     Social History:  History   Social History Narrative   Lives in [place] since 1982   Divorced   [id number]   daughters live close   Retired from [employer] smoker/dirnks occ wine          No Known Allergies  Family History  Problem Relation Age of Onset  . CAD  74    father-mulitple  . Emphysema      mother  . Diabetes Mellitus II      father  2  Prior to Admission medications   Medication Sig Start Date End Date Taking? Authorizing Provider  allopurinol (ZYLOPRIM)  100 MG tablet Take 100 mg by mouth 2 (two) times daily. 07/28/13  Yes Jacolyn Reedy, MD  amoxicillin-clavulanate (AUGMENTIN) 875-125 MG per tablet Take 1 tablet by mouth every 12 (twelve) hours. For 10 days. Started 01/15/14-01/25/14 01/15/14  Yes Historical Provider, MD  atorvastatin (LIPITOR) 20 MG tablet Take 20 mg by mouth daily.   Yes Historical Provider, MD  colchicine 0.6 MG tablet Take 0.6 mg by mouth daily. 07/28/13  Yes Jacolyn Reedy, MD  furosemide (LASIX) 20 MG tablet Take 20 mg by mouth daily. 08/12/13   Yes Lelon Perla, MD  lisinopril (PRINIVIL,ZESTRIL) 40 MG tablet Take 80 mg by mouth daily.    Yes Historical Provider, MD  metoprolol tartrate (LOPRESSOR) 25 MG tablet Take 25 mg by mouth 2 (two) times daily. 07/28/13  Yes Jacolyn Reedy, MD  naproxen sodium (ANAPROX) 220 MG tablet Take 440 mg by mouth 2 (two) times daily as needed (for pain).    Yes Historical Provider, MD  potassium chloride SA (K-DUR,KLOR-CON) 20 MEQ tablet Take 20 mEq by mouth daily. 08/12/13  Yes Lelon Perla, MD  pramipexole (MIRAPEX) 1 MG tablet Take 2 mg by mouth at bedtime.    Yes Historical Provider, MD  PROAIR HFA 108 (90 BASE) MCG/ACT inhaler Inhale 2 puffs into the lungs every 6 (six) hours as needed. For shortness of breath and wheezing 01/15/14  Yes Historical Provider, MD  rivaroxaban (XARELTO) 20 MG TABS tablet Take 1 tablet (20 mg total) by mouth daily with supper. 11/29/13  Yes Lelon Perla, MD  verapamil (CALAN-SR) 240 MG CR tablet Take 240 mg by mouth at bedtime.   Yes Historical Provider, MD   Physical Exam: Filed Vitals:   01/23/14 1448 01/23/14 1500 01/23/14 1540 01/23/14 1600  BP: 106/54 128/44 100/62   Pulse:  114 116   Temp:    100.7 F (38.2 C)  TempSrc:    Rectal  Resp: 17 15 12    Height:      Weight:      SpO2: 97% 94% 93%      General:   alert pleasant oriented speaking in clear sentences no increased work reading   Eyes:  no scleral icterus, possible cataracts in both eyes  ENTTender right maxilla.  Discomfort on touch and pressure, ethmoid as well as frontal sinuses are nontender. Patient's ears and TMs appear intact without any fluid air levels however there are red. There is thick tenacious sputum in her throat  neck: Significant submandibular lymphadenopathy is felt and is slightly tender  Cariovascular: S1-S2 tachycardic irregularly irregular no murmur rub or gallop rates as high as 120  RespiClinically clear no added sound  abddomen: Soft nontender obese Range  of motion intact moving all 4 limbs equally no lower extremity edema   power 5/5 bilaterally  Labs on Admission:  Basic Metabolic Panel:  Recent Labs Lab 01/23/14 1220  NA 135  K 4.0  CL 104  CO2 27  GLUCOSE 110*  BUN 25*  CREATININE 0.87  CALCIUM 8.9   Liver Function Tests: No results for input(s): AST, ALT, ALKPHOS, BILITOT, PROT, ALBUMIN in the last 168 hours. No results for input(s): LIPASE, AMYLASE in the last 168 hours. No results for input(s): AMMONIA in the last 168 hours. CBC:  Recent Labs Lab 01/23/14 1220  WBC 15.8*  NEUTROABS 11.9*  HGB 15.5*  HCT 47.3*  MCV 92.9  PLT 281   Cardiac Enzymes: No results for input(s): CKTOTAL, CKMB, CKMBINDEX, TROPONINI  in the last 168 hours.  BNP (last 3 results)  Recent Labs  07/22/13 1710 07/24/13 0217  PROBNP 2246.0* 1013.0*   CBG: No results for input(s): GLUCAP in the last 168 hours.  Radiological Exams on Admission: Dg Chest 2 View  01/23/2014   CLINICAL DATA:  Cough, having cold since Christmas  EXAM: CHEST  2 VIEW  COMPARISON:  01/15/2014  FINDINGS: Cardiomediastinal silhouette is stable. No acute infiltrate or pleural effusion. No pulmonary edema. Degenerative changes thoracic spine again noted. Stable old deformity of left glenohumeral joint. Stable kyphosis of mid thoracic spine. Again noted scarring or prominent fat pad left cardiophrenic angle. Central mild bronchitic changes. Old left rib fractures again noted.  IMPRESSION: No acute infiltrate or pulmonary edema. Central mild bronchitic changes.   Electronically Signed   By: Lahoma Crocker M.D.   On: 01/23/2014 13:13   Ct Soft Tissue Neck W Contrast  01/23/2014   CLINICAL DATA:  74 year old female with bilateral neck swelling. Shortness of Breath. Initial encounter.  EXAM: CT NECK WITH CONTRAST  TECHNIQUE: Multidetector CT imaging of the neck was performed using the standard protocol following the bolus administration of intravenous contrast.  CONTRAST:  129mL  OMNIPAQUE IOHEXOL 350 MG/ML SOLN in conjunction with contrast enhanced imaging of the Chest CTA reported separately.  COMPARISON:  Chest CTA today reported separately. Brain MRI 02/14/2013.  FINDINGS: Pharynx and larynx: Mild motion artifact. Laryngeal contours are within normal limits. There is fairly generalized edema of the pharyngeal mucosal space, most apparent at the hypopharynx. There does appear to be some involvement of the epiglottis as seen on series 12, image 59. Still, the hypopharynx is relatively spared, normal right piriform recess on series 12, image 67. The oropharynx primarily is affected.  Parapharyngeal spaces, retropharyngeal space, and sublingual space are within normal limits.  Salivary glands: Abnormally hyperenhancing bilateral submandibular glands. Bilateral somewhat nodular and hyperenhancing parotid glands. Small volume of periparotid fluid anteriorly tracking toward both submandibular spaces.  No sialolithiasis.  No salivary ductal dilatation identified.  Thyroid: Motion artifact at the level of the thyroid gland. Bilateral subcentimeter hypodense nodules.  Lymph nodes: Small but hyperenhancing bilateral level 1 lymph nodes. Small bilateral level 2 lymph nodes, also mildly hyper enhancing. Small level 3 nodes have a more normal appearance. No level 5 or level 4 lymphadenopathy. Mildly prominent right retropharyngeal lymph node on series 12, image 41.  Vascular: Patent with bilateral carotid bifurcation atherosclerosis. Tortuous bilateral ICAs with a partially retropharyngeal course. Both internal jugular veins are patent.  Limited intracranial: Negative.  Mastoids and visualized paranasal sinuses: Mucoperiosteal thickening in the left maxillary sinus. Moderate bubbly opacity and opacification of the right maxillary sinus. Trace paranasal sinus mucosal thickening elsewhere. Tympanic cavities and mastoids are clear.  Skeleton: Poor dentition. Age appropriate degenerative changes in the  cervical spine.  Orbits:  Negative.  Other Findings: subcutaneous fat stranding in the submental and submandibular regions slightly greater on the left and associated with mild thickening of the platysma. The degree of inflammation is Mild overall.  Upper chest: Reported separately.  IMPRESSION: 1. Mucosal edema a in the pharynx, primarily affecting the oropharynx but also with some epiglottis involvement. At the same time there is hyper enhancement of the bilateral parotid and submandibular glands. This constellation of findings can be seen following head and neck radiation (such as for previous head and neck cancer). But if there is no corresponding history of radiation treatment then presumably these changes relate to an acute infection or systemic  inflammation. 2. Mild subcutaneous inflammation about the chin, such as due to mild cellulitis. No drainable fluid collection or abscess. 3. Poor dentition. Moderate inflammation of the right maxillary sinus. Chronic left maxillary sinusitis. 4. Chest CTA reported separately.   Electronically Signed   By: Lars Pinks M.D.   On: 01/23/2014 15:07   Ct Angio Chest Pe W/cm &/or Wo Cm  01/23/2014   CLINICAL DATA:  Neck swelling at chin bil. Sob from cough/cold last has lasted since dec 2015COMERFORD, JANIS E on 01/23/2014 at 2:42 PM. Patient reports having a cold starting before Christmas "that just won't go away." Has non-productive cough. Denies fevers. Denies N/V/D. Still taking Doxycycline and Prednisone. Had CXR performed at PCP last week and said there were no abnormal results noted. No history of stroke. Left sided facial swelling noted and bilateral neck swelling noted  EXAM: CT ANGIOGRAPHY CHEST WITH CONTRAST  TECHNIQUE: Multidetector CT imaging of the chest was performed using the standard protocol during bolus administration of intravenous contrast. Multiplanar CT image reconstructions and MIPs were obtained to evaluate the vascular anatomy.  CONTRAST:  144mL  OMNIPAQUE IOHEXOL 350 MG/ML SOLN  COMPARISON:  Chest x-ray 01/23/2014  FINDINGS: Lungs are adequately inflated without focal airspace consolidation or effusion. There is mild patchy linear density over the lower lungs compatible with scarring/atelectasis. There is a 7-8 mm nodule over the left apex in addition to a 1 cm peripheral nodule more superiorly in the left apex. Moderate 1.1 cm focus of likely aspirated material along the dependent portion of the left mainstem bronchus at the level of the carina. Airways are otherwise unremarkable.  Heart size is within normal. There is minimal calcification in the region of the mitral valve annulus. There is minimal calcified plaque at the origin of the left main coronary artery. There is mild calcified plaque over the aortic arch. 1 cm right subcarinal lymph node. No other significant adenopathy in the mediastinum, hila or axillary regions. Remaining mediastinal structures are unremarkable.  Images through the upper abdomen demonstrate minimal calcified plaque over the abdominal aorta. There are mild degenerate changes of the spine. There are multiple old left lateral rib fractures.  Review of the MIP images confirms the above findings.  IMPRESSION: No acute cardiopulmonary disease. No evidence of pulmonary embolism.  1.1 cm focus of dependent aspirated material over the proximal left mainstem bronchus at the level of the carina. Correlate with aspiration risk and possible future development of aspiration pneumonia.  Two left upper lobe nodules in the apex with the larger measuring 1 cm. 1 cm right subcarinal lymph node likely reactive. Recommend three-month followup chest CT versus PET-CT for further evaluation.   Electronically Signed   By: Marin Olp M.D.   On: 01/23/2014 15:10    EKG: Independently reviewed.  Atrial fibrillation, right axis deviation, now rate related changes other than what is noted on EKG and no concern for stemi    early sepsis secondary  to acute bacterial epiglottitis + aspiration pneumonia  I have requested ENT to evaluate the patient -patient possibly also has a secondary trachea pharyngitis leading to aspiration pneumonia . We will cover the patient for now with Zosyn given her elevated white count which should cover all possible organisms. climically  Have very low suspicion for MRSA.  Differential diagnosis for facial swelling could be lisinopril which we will discontinue , I will continue her on IV Solu-Medrol 60 mg every 6 hourly as well as Benadryl by mouth as she is  able to take by mouth. Further workup as per ENT physician   Possible ACE inhibitor allergy/an anaphylaxis ?-Discontinue lisinopril. Caution with ARB. Outpatient management of hypertension as an outpatient , we will start Benadryl every 6 hourly 25 mg for 3 doses and continue Pepcid . At this stage I do not think she needs any further epinephrine   Atrial fibrillation with rapid ventricular response, [place] score 5 on novel anticoagulant -I've requested EDP to administer 25 mg of her regular scheduled and Toprol. If her heart rate does not come under better control she will need a Cardizem drip 5 mg per hour and titrate to rate control. We will also been need cardiology input. It appears that her last echocardiogram had indeterminant diastolic dysfunction   Euvolemic CHF diastolic? -Echocardiogram 07/2013-patient is euvolemic at present time clinically, patient's BNP is falsely elevated secondary to potential early sepsis,   Hold home dosage of Lasix given volume depletion   acute kidney injury-IV saline for 24 hours 50 cc per hour. Hold home dose of Lasix   gouty arthritis-continue allopurinol100 twice a day, colchicine 0.6 mg  Restless leg syndrome continue Mirapex 2 mg daily at bedtime  Morbid obesity, BMI 30.3 -patient should be counseled on weight los  Mild intermittent asthma continue albuterol 2 puffs every 6 when necessary   65 minutes  Patient states  wishes no CODE STATUS-to be updated stepdown unit overnight   Verlon [facility] Pager [phone], please contact night-coverage [email or web address] Password [email or web address] 01/23/2014, 4:18 PM

## 2014-01-23 NOTE — ED Notes (Addendum)
Patient reports having a cold starting before Christmas "that just won't go away." Has non-productive cough. Denies fevers. Denies N/V/D. Still taking Doxycycline and Prednisone. Had CXR performed at PCP last week and said there were no abnormal results noted. No history of stroke. Left sided facial swelling noted and bilateral neck swelling noted. Developed gout over the summer (now taking Allopurinol). No new medications taken (Lisinopril, Metoprolol are not new medications for patient). Has not taken Benadryl today. Neurologically intact. A&Ox4. Equal grips and leg strength. Says she is "slightly SOB" but that this is baseline for her because of hx asthma. No other issues noted. Awaiting MD.

## 2014-01-23 NOTE — ED Provider Notes (Signed)
CSN: [id number]     Arrival date & time 01/23/14  1127 History   First MD Initiated Contact with Patient 01/23/14 1147     Chief Complaint  Patient presents with  . Facial Swelling  . Nasal Congestion  . Cough     (Consider location/radiation/quality/duration/timing/severity/associated sxs/prior Treatment) HPI Comments: Pt presents with swelling of the soft tissues of her face and her neck since last night.  She states that she developed a "cold around Christmas and had been on a prednisone Dosepak, and doxycycline.  She says she initially had been feeling somewhat improved before the new symptoms started yesterday.  She denies that the swelling is painful or itchy.  She says she has had some mild shortness of breath.  It doesn't feel short of breath currently.  She is chest pain or leg swelling.  She has missed several days of Xarelto about 1 month ago, but says she has been taking it consistently since.    Patient is a 74 y.o. female presenting with cough.  Cough Associated symptoms: shortness of breath   Associated symptoms: no chest pain, no chills, no diaphoresis, no eye discharge, no fever, no headaches, no rhinorrhea and no sore throat     Past Medical History  Diagnosis Date  . Hypertension   . Asthma   . Atrial fibrillation    Past Surgical History  Procedure Laterality Date  . Back surgery     History reviewed. No pertinent family history. History  Substance Use Topics  . Smoking status: Never Smoker   . Smokeless tobacco: Never Used  . Alcohol Use: Yes     Comment: occ   OB History    No data available     Review of Systems  Constitutional: Negative for fever, chills, diaphoresis, activity change, appetite change and fatigue.  HENT: Positive for facial swelling. Negative for congestion, rhinorrhea and sore throat.   Eyes: Negative for photophobia and discharge.  Respiratory: Positive for cough and shortness of breath. Negative for chest tightness.     Cardiovascular: Negative for chest pain, palpitations and leg swelling.  Gastrointestinal: Negative for nausea, vomiting, abdominal pain and diarrhea.  Endocrine: Negative for polydipsia and polyuria.  Genitourinary: Negative for dysuria, frequency, difficulty urinating and pelvic pain.  Musculoskeletal: Negative for back pain, arthralgias, neck pain and neck stiffness.  Skin: Negative for color change and wound.  Allergic/Immunologic: Negative for immunocompromised state.  Neurological: Negative for facial asymmetry, weakness, numbness and headaches.  Hematological: Does not bruise/bleed easily.  Psychiatric/Behavioral: Negative for confusion and agitation.      Allergies  Review of patient's allergies indicates no known allergies.  Home Medications   Prior to Admission medications   Medication Sig Start Date End Date Taking? Authorizing Provider  allopurinol (ZYLOPRIM) 100 MG tablet Take 100 mg by mouth 2 (two) times daily. 07/28/13  Yes Jacolyn Reedy, MD  amoxicillin-clavulanate (AUGMENTIN) 875-125 MG per tablet Take 1 tablet by mouth every 12 (twelve) hours. For 10 days. Started 01/15/14-01/25/14 01/15/14  Yes Historical Provider, MD  atorvastatin (LIPITOR) 20 MG tablet Take 20 mg by mouth daily.   Yes Historical Provider, MD  colchicine 0.6 MG tablet Take 0.6 mg by mouth daily. 07/28/13  Yes Jacolyn Reedy, MD  furosemide (LASIX) 20 MG tablet Take 20 mg by mouth daily. 08/12/13  Yes Lelon Perla, MD  lisinopril (PRINIVIL,ZESTRIL) 40 MG tablet Take 80 mg by mouth daily.    Yes Historical Provider, MD  metoprolol tartrate (LOPRESSOR) 25  MG tablet Take 25 mg by mouth 2 (two) times daily. 07/28/13  Yes Jacolyn Reedy, MD  naproxen sodium (ANAPROX) 220 MG tablet Take 440 mg by mouth 2 (two) times daily as needed (for pain).    Yes Historical Provider, MD  potassium chloride SA (K-DUR,KLOR-CON) 20 MEQ tablet Take 20 mEq by mouth daily. 08/12/13  Yes Lelon Perla, MD   pramipexole (MIRAPEX) 1 MG tablet Take 2 mg by mouth at bedtime.    Yes Historical Provider, MD  PROAIR HFA 108 (90 BASE) MCG/ACT inhaler Inhale 2 puffs into the lungs every 6 (six) hours as needed. For shortness of breath and wheezing 01/15/14  Yes Historical Provider, MD  rivaroxaban (XARELTO) 20 MG TABS tablet Take 1 tablet (20 mg total) by mouth daily with supper. 11/29/13  Yes Lelon Perla, MD  verapamil (CALAN-SR) 240 MG CR tablet Take 240 mg by mouth at bedtime.   Yes Historical Provider, MD   BP 106/64 mmHg  Pulse 93  Temp(Src) 99.3 F (37.4 C) (Oral)  Resp 20  Ht 5\' 4"  (1.626 m)  Wt 177 lb (80.287 kg)  BMI 30.37 kg/m2  SpO2 95% Physical Exam  Constitutional: She is oriented to person, place, and time. She appears well-developed and well-nourished. No distress.  HENT:  Head: Normocephalic and atraumatic.    Mouth/Throat: No oropharyngeal exudate.  Periorbital edema, edema of upper lip  Eyes: Pupils are equal, round, and reactive to light.  Neck: Normal range of motion. Neck supple.  Cardiovascular: Normal rate, regular rhythm and normal heart sounds.  Exam reveals no gallop and no friction rub.   No murmur heard. Pulmonary/Chest: Effort normal and breath sounds normal. No respiratory distress. She has no wheezes. She has no rales.  Abdominal: Soft. Bowel sounds are normal. She exhibits no distension and no mass. There is no tenderness. There is no rebound and no guarding.  Musculoskeletal: Normal range of motion. She exhibits no edema or tenderness.       Back:  Neurological: She is alert and oriented to person, place, and time.  Skin: Skin is warm and dry.  Psychiatric: She has a normal mood and affect.    ED Course  Procedures (including critical care time) Labs Review Labs Reviewed  CBC WITH DIFFERENTIAL  BRAIN NATRIURETIC PEPTIDE  BASIC METABOLIC PANEL  D-DIMER, QUANTITATIVE    Imaging Review No results found.   EKG Interpretation None      MDM    Final diagnoses:  Cough    Pt is a 74 y.o. female with Pmhx as above who presents with swelling of the soft tissues of her face and her neck since last night.  She states that she developed a "cold around Christmas and had been on a prednisone Dosepak, and doxycycline.  She says she initially had been feeling somewhat improved before the new symptoms started yesterday.  She denies that the swelling is painful or itchy.  She says she has had some mild shortness of breath.  It doesn't feel short of breath currently.  She is chest pain or leg swelling.  She has missed several days of Xarelto about 1 month ago, but says she has been taking it consistently since.  On physical exam, patient initially tachycardic to 119.  The repeated at 104 then 93.  She is in atrial fibrillation which she has a history of (paroxysmal A. Fib).  Lungs are clear.  No significant lower externally pitting edema.  She has bilateral periorbital edema  without warmth or erythema.  She has some reported soft tissue swelling on the lateral sides of her neck and submentally.  She has bilateral submental nontender lymphadenopathy, edema of the upper lip but no appreciable edema of the oropharynx.     It is unclear to me at this time.  If the swelling is due to her recent use of prednisone, angioedema, as caused by lisinopril, or symptoms of heart failure given her history of A. Fib.    Called to bedside because pt/daughter felt like she was havign trouble speaking, and was maybe having tongue swelling, which when asked directed if she felt her tongue was swollen, she states she wasn't sure, but felt like there was a bunch of stuff in her mouth.  I cannot appreciate any tongue swelling, though she did have a large amount of thick saliva stuck to the hard palate.  She appeared to have some mild edema of her soft palate, but no posterior or pharyngeal edema.  I couldn't appreciate a voice change.  She is not short of breath.  Given potential  progression of symptoms.  We'll give IV epinephrine IV steroids and IV Pepcid.  Her lip and face do not appear significantly more swollen.   CTA chest & soft tissue neck done, with no evidence of PE, but did have some likely aspirated material in the left mainstem bronchus.  Additionally, she was found to have 2 left upper lobe nodules, as well as mucosal edema in the pharynx, primarily affecting the oropharynx, but also with some in the epiglottis, hyperenhancement of the bilateral parotid and submandibular glands.  Mild submucosal inflammation in the chin (not c/w cellulitis or abscess on PE).  She feels like her swelling of her lips have improved as well as her tongue. Given her age, she cannot be ruled out for an infectious epiglottitis, 2g IV rocephin will be given (temp 100.7). Of note, she does report occasionally having difficulty with swallowing her pills.  Per [facility] request, ENT consulted and will see in the ED.   1. Cough   2. SOB (shortness of breath)   3. Neck swelling      Ernestina Patches, MD 01/23/14 [phone]

## 2014-01-23 NOTE — ED Provider Notes (Signed)
Dr. Redmond Baseman has evaluated the patient and feels that she does not need ICU care for airway management. He is evaluated her airway and feels that there is no epiglottitis or airway swelling. Updated the hospitalist, will admit to telemetry for tachycardia and possible anaphylaxis.  Ephraim Hamburger, MD 01/23/14 2108

## 2014-01-23 NOTE — Procedures (Signed)
Preop diagnosis: Epiglottis edema on CT Postop diagnosis: Normal epiglottis, probably angioedema Procedure: Fiberoptic laryngoscopy Surgeon: Redmond Baseman Anesth: Topical with 4% viscous lidocaine Compl: None Findings: No significant edema or inflammation of epiglottis, endolarynx, or pharynx.  Vocal folds are normally mobile and glottis is easily viewed. Description: After discussing risks, benefits, and alternatives, she was placed in a seated position.  The fiberoptic laryngoscope was coated with 4% viscous lidocaine and the scope was then gently passed through the right nasal passage to view the pharynx and larynx.  Findings are noted above.  After completion, the scope was removed and she was returned to nursing care in stable condition.

## 2014-01-23 NOTE — ED Notes (Addendum)
Patient transported to X-ray 

## 2014-01-23 NOTE — Consult Note (Signed)
Reason for Consult:Neck CT result, facial and neck edema Referring Physician: ER  Cristina [name] is an 74 y.o. female.  HPI: 74 year old female had URI symptoms around Christmas and was treated with doxycycline and prednisone.  Early this week, she noticed swelling of the anterior neck and cheeks that she was told was related to her illness.  It worsened through the week so she presented to the ER today.  She denies throat pain, difficulty breathing or swallowing, or facial or neck pain.  She had hoarseness earlier this week that has improved.  While in the ER, she felt like her tongue swelled making it difficult to talk but this has since resolved, perhaps with medical therapy.  A neck CT suggested swelling of the epiglottis so consult was requested.  Past Medical History  Diagnosis Date  . Hypertension   . Asthma   . Atrial fibrillation     Past Surgical History  Procedure Laterality Date  . Back surgery      Family History  Problem Relation Age of Onset  . CAD  54    father-mulitple  . Emphysema      mother  . Diabetes Mellitus II      father    Social History:  reports that she has never smoked. She has never used smokeless tobacco. She reports that she drinks alcohol. Her drug history is not on file.  Allergies: No Known Allergies  Medications: I have reviewed the patient's current medications.  Results for orders placed or performed during the hospital encounter of 01/23/14 (from the past 48 hour(s))  CBC with Differential     Status: Abnormal   Collection Time: 01/23/14 12:20 PM  Result Value Ref Range   WBC 15.8 (H) 4.0 - 10.5 K/uL   RBC 5.09 3.87 - 5.11 MIL/uL   Hemoglobin 15.5 (H) 12.0 - 15.0 g/dL   HCT 47.3 (H) 36.0 - 46.0 %   MCV 92.9 78.0 - 100.0 fL   MCH 30.5 26.0 - 34.0 pg   MCHC 32.8 30.0 - 36.0 g/dL   RDW 14.6 11.5 - 15.5 %   Platelets 281 150 - 400 K/uL   Neutrophils Relative % 75 43 - 77 %   Neutro Abs 11.9 (H) 1.7 - 7.7 K/uL   Lymphocytes  Relative 11 (L) 12 - 46 %   Lymphs Abs 1.7 0.7 - 4.0 K/uL   Monocytes Relative 10 3 - 12 %   Monocytes Absolute 1.6 (H) 0.1 - 1.0 K/uL   Eosinophils Relative 3 0 - 5 %   Eosinophils Absolute 0.5 0.0 - 0.7 K/uL   Basophils Relative 1 0 - 1 %   Basophils Absolute 0.1 0.0 - 0.1 K/uL  Brain natriuretic peptide     Status: Abnormal   Collection Time: 01/23/14 12:20 PM  Result Value Ref Range   B Natriuretic Peptide 208.1 (H) 0.0 - 100.0 pg/mL    Comment: Please note change in reference range.  Basic metabolic panel     Status: Abnormal   Collection Time: 01/23/14 12:20 PM  Result Value Ref Range   Sodium 135 135 - 145 mmol/L    Comment: Please note change in reference range.   Potassium 4.0 3.5 - 5.1 mmol/L    Comment: Please note change in reference range.   Chloride 104 96 - 112 mEq/L   CO2 27 19 - 32 mmol/L   Glucose, Bld 110 (H) 70 - 99 mg/dL   BUN 25 (H) 6 -  23 mg/dL   Creatinine, Ser 0.87 0.50 - 1.10 mg/dL   Calcium 8.9 8.4 - 10.5 mg/dL   GFR calc non Af Amer 64 (L) >90 mL/min   GFR calc Af Amer 74 (L) >90 mL/min    Comment: (NOTE) The eGFR has been calculated using the CKD EPI equation. This calculation has not been validated in all clinical situations. eGFR's persistently <90 mL/min signify possible Chronic Kidney Disease.    Anion gap 4 (L) 5 - 15  D-dimer, quantitative     Status: Abnormal   Collection Time: 01/23/14 12:20 PM  Result Value Ref Range   D-Dimer, Quant 1.37 (H) 0.00 - 0.48 ug/mL-FEU    Comment:        AT THE INHOUSE ESTABLISHED CUTOFF VALUE OF 0.48 ug/mL FEU, THIS ASSAY HAS BEEN DOCUMENTED IN THE LITERATURE TO HAVE A SENSITIVITY AND NEGATIVE PREDICTIVE VALUE OF AT LEAST 98 TO 99%.  THE TEST RESULT SHOULD BE CORRELATED WITH AN ASSESSMENT OF THE CLINICAL PROBABILITY OF DVT / VTE.   Rapid strep screen     Status: None   Collection Time: 01/23/14  4:00 PM  Result Value Ref Range   Streptococcus, Group A Screen (Direct) NEGATIVE NEGATIVE     Comment: (NOTE) A Rapid Antigen test may result negative if the antigen level in the sample is below the detection level of this test. The FDA has not cleared this test as a stand-alone test therefore the rapid antigen negative result has reflexed to a Group A Strep culture.     Dg Chest 2 View  01/23/2014   CLINICAL DATA:  Cough, having cold since Christmas  EXAM: CHEST  2 VIEW  COMPARISON:  01/15/2014  FINDINGS: Cardiomediastinal silhouette is stable. No acute infiltrate or pleural effusion. No pulmonary edema. Degenerative changes thoracic spine again noted. Stable old deformity of left glenohumeral joint. Stable kyphosis of mid thoracic spine. Again noted scarring or prominent fat pad left cardiophrenic angle. Central mild bronchitic changes. Old left rib fractures again noted.  IMPRESSION: No acute infiltrate or pulmonary edema. Central mild bronchitic changes.   Electronically Signed   By: Lahoma Crocker M.D.   On: 01/23/2014 13:13   Ct Soft Tissue Neck W Contrast  01/23/2014   CLINICAL DATA:  74 year old female with bilateral neck swelling. Shortness of Breath. Initial encounter.  EXAM: CT NECK WITH CONTRAST  TECHNIQUE: Multidetector CT imaging of the neck was performed using the standard protocol following the bolus administration of intravenous contrast.  CONTRAST:  133mL OMNIPAQUE IOHEXOL 350 MG/ML SOLN in conjunction with contrast enhanced imaging of the Chest CTA reported separately.  COMPARISON:  Chest CTA today reported separately. Brain MRI 02/14/2013.  FINDINGS: Pharynx and larynx: Mild motion artifact. Laryngeal contours are within normal limits. There is fairly generalized edema of the pharyngeal mucosal space, most apparent at the hypopharynx. There does appear to be some involvement of the epiglottis as seen on series 12, image 59. Still, the hypopharynx is relatively spared, normal right piriform recess on series 12, image 67. The oropharynx primarily is affected.  Parapharyngeal spaces,  retropharyngeal space, and sublingual space are within normal limits.  Salivary glands: Abnormally hyperenhancing bilateral submandibular glands. Bilateral somewhat nodular and hyperenhancing parotid glands. Small volume of periparotid fluid anteriorly tracking toward both submandibular spaces.  No sialolithiasis.  No salivary ductal dilatation identified.  Thyroid: Motion artifact at the level of the thyroid gland. Bilateral subcentimeter hypodense nodules.  Lymph nodes: Small but hyperenhancing bilateral level 1 lymph nodes. Small  bilateral level 2 lymph nodes, also mildly hyper enhancing. Small level 3 nodes have a more normal appearance. No level 5 or level 4 lymphadenopathy. Mildly prominent right retropharyngeal lymph node on series 12, image 41.  Vascular: Patent with bilateral carotid bifurcation atherosclerosis. Tortuous bilateral ICAs with a partially retropharyngeal course. Both internal jugular veins are patent.  Limited intracranial: Negative.  Mastoids and visualized paranasal sinuses: Mucoperiosteal thickening in the left maxillary sinus. Moderate bubbly opacity and opacification of the right maxillary sinus. Trace paranasal sinus mucosal thickening elsewhere. Tympanic cavities and mastoids are clear.  Skeleton: Poor dentition. Age appropriate degenerative changes in the cervical spine.  Orbits:  Negative.  Other Findings: subcutaneous fat stranding in the submental and submandibular regions slightly greater on the left and associated with mild thickening of the platysma. The degree of inflammation is Mild overall.  Upper chest: Reported separately.  IMPRESSION: 1. Mucosal edema a in the pharynx, primarily affecting the oropharynx but also with some epiglottis involvement. At the same time there is hyper enhancement of the bilateral parotid and submandibular glands. This constellation of findings can be seen following head and neck radiation (such as for previous head and neck cancer). But if there is  no corresponding history of radiation treatment then presumably these changes relate to an acute infection or systemic inflammation. 2. Mild subcutaneous inflammation about the chin, such as due to mild cellulitis. No drainable fluid collection or abscess. 3. Poor dentition. Moderate inflammation of the right maxillary sinus. Chronic left maxillary sinusitis. 4. Chest CTA reported separately.   Electronically Signed   By: Lars Pinks M.D.   On: 01/23/2014 15:07   Ct Angio Chest Pe W/cm &/or Wo Cm  01/23/2014   CLINICAL DATA:  Neck swelling at chin bil. Sob from cough/cold last has lasted since dec 2015COMERFORD, JANIS E on 01/23/2014 at 2:42 PM. Patient reports having a cold starting before Christmas "that just won't go away." Has non-productive cough. Denies fevers. Denies N/V/D. Still taking Doxycycline and Prednisone. Had CXR performed at PCP last week and said there were no abnormal results noted. No history of stroke. Left sided facial swelling noted and bilateral neck swelling noted  EXAM: CT ANGIOGRAPHY CHEST WITH CONTRAST  TECHNIQUE: Multidetector CT imaging of the chest was performed using the standard protocol during bolus administration of intravenous contrast. Multiplanar CT image reconstructions and MIPs were obtained to evaluate the vascular anatomy.  CONTRAST:  1105mL OMNIPAQUE IOHEXOL 350 MG/ML SOLN  COMPARISON:  Chest x-ray 01/23/2014  FINDINGS: Lungs are adequately inflated without focal airspace consolidation or effusion. There is mild patchy linear density over the lower lungs compatible with scarring/atelectasis. There is a 7-8 mm nodule over the left apex in addition to a 1 cm peripheral nodule more superiorly in the left apex. Moderate 1.1 cm focus of likely aspirated material along the dependent portion of the left mainstem bronchus at the level of the carina. Airways are otherwise unremarkable.  Heart size is within normal. There is minimal calcification in the region of the mitral valve  annulus. There is minimal calcified plaque at the origin of the left main coronary artery. There is mild calcified plaque over the aortic arch. 1 cm right subcarinal lymph node. No other significant adenopathy in the mediastinum, hila or axillary regions. Remaining mediastinal structures are unremarkable.  Images through the upper abdomen demonstrate minimal calcified plaque over the abdominal aorta. There are mild degenerate changes of the spine. There are multiple old left lateral rib fractures.  Review  of the MIP images confirms the above findings.  IMPRESSION: No acute cardiopulmonary disease. No evidence of pulmonary embolism.  1.1 cm focus of dependent aspirated material over the proximal left mainstem bronchus at the level of the carina. Correlate with aspiration risk and possible future development of aspiration pneumonia.  Two left upper lobe nodules in the apex with the larger measuring 1 cm. 1 cm right subcarinal lymph node likely reactive. Recommend three-month followup chest CT versus PET-CT for further evaluation.   Electronically Signed   By: Marin Olp M.D.   On: 01/23/2014 15:10    Review of Systems  Constitutional:       URI symptoms  HENT:       Facial and neck swelling  All other systems reviewed and are negative.  Blood pressure 142/90, pulse 118, temperature 98.9 F (37.2 C), temperature source Oral, resp. rate 16, height $RemoveBe'5\' 4"'[id number]$  (1.626 m), weight 80.287 kg (177 lb), SpO2 96 %. Physical Exam  Constitutional: She is oriented to person, place, and time. She appears well-developed and well-nourished. No distress.  HENT:  Head: Normocephalic and atraumatic.  Right Ear: External ear normal.  Left Ear: External ear normal.  Nose: Nose normal.  Mouth/Throat: Oropharynx is clear and moist.  Left greater than mild right maxillary edema, lower lid edema bilaterally.  Upper lip with scabby skin.  Dry mucus membranes.  No edema of tongue, floor of mouth, or soft palate.  Normal voice.   TMs intact with aerated MEs.  Eyes: Conjunctivae and EOM are normal. Pupils are equal, round, and reactive to light.  Neck: Normal range of motion. Neck supple.  Anterior neck with loose edema, no tenderness.  Cardiovascular: Normal rate.   Respiratory: Effort normal.  No stridor.  Musculoskeletal: Normal range of motion.  Neurological: She is alert and oriented to person, place, and time. No cranial nerve deficit.  Skin: Skin is warm and dry.  Psychiatric: She has a normal mood and affect. Her behavior is normal. Judgment and thought content normal.    Assessment/Plan: Angioedema, right acute maxillary sinusitis Fiberoptic exam of the pharynx and larynx was performed at the bedside showing no significant edema or inflammatory changes of the epiglottis, endolarynx, or pharynx.  I personally reviewed her neck CT and the described pharyngeal edema and epiglottis edema, at least at present, does not correspond to true inflammatory changes on direct visualization.  She was reassured.  The CT did demonstrate acute right maxillary sinusitis which may warrant antibiotic therapy.  Regarding her facial and neck edema and transient tongue edema, I suspect that this represents angioedema due to her ACE inhibitor.  I recommend cessation of ACE inhibitors and treatment with steroids and anti-histamines as is typically given in that case.  I agree with overnight observation given her tongue involvement which is now resolved but do not feel that she will need an ICU setting.  Call with any concerns.  Dorr Perrot 01/23/2014, 6:10 PM

## 2014-01-24 LAB — COMPREHENSIVE METABOLIC PANEL
ALK PHOS: 109 U/L (ref 39–117)
ALT: 25 U/L (ref 0–35)
AST: 29 U/L (ref 0–37)
Albumin: 3.2 g/dL — ABNORMAL LOW (ref 3.5–5.2)
Anion gap: 14 (ref 5–15)
BUN: 32 mg/dL — ABNORMAL HIGH (ref 6–23)
CHLORIDE: 102 meq/L (ref 96–112)
CO2: 23 mmol/L (ref 19–32)
Calcium: 8.5 mg/dL (ref 8.4–10.5)
Creatinine, Ser: 1.16 mg/dL — ABNORMAL HIGH (ref 0.50–1.10)
GFR calc Af Amer: 52 mL/min — ABNORMAL LOW (ref >90)
GFR calc non Af Amer: 45 mL/min — ABNORMAL LOW (ref >90)
Glucose, Bld: 248 mg/dL — ABNORMAL HIGH (ref 70–99)
Potassium: 5.2 mmol/L — ABNORMAL HIGH (ref 3.5–5.1)
Sodium: 139 mmol/L (ref 135–145)
TOTAL PROTEIN: 5.7 g/dL — AB (ref 6.0–8.3)
Total Bilirubin: 0.7 mg/dL (ref 0.3–1.2)

## 2014-01-24 LAB — CBC
HEMATOCRIT: 43.3 % (ref 36.0–46.0)
Hemoglobin: 14.1 g/dL (ref 12.0–15.0)
MCH: 30.5 pg (ref 26.0–34.0)
MCHC: 32.6 g/dL (ref 30.0–36.0)
MCV: 93.5 fL (ref 78.0–100.0)
Platelets: 208 10*3/uL (ref 150–400)
RBC: 4.63 MIL/uL (ref 3.87–5.11)
RDW: 15.1 % (ref 11.5–15.5)
WBC: 12.2 10*3/uL — ABNORMAL HIGH (ref 4.0–10.5)

## 2014-01-24 LAB — BASIC METABOLIC PANEL
Anion gap: 9 (ref 5–15)
BUN: 37 mg/dL — AB (ref 6–23)
CHLORIDE: 103 meq/L (ref 96–112)
CO2: 29 mmol/L (ref 19–32)
Calcium: 8.9 mg/dL (ref 8.4–10.5)
Creatinine, Ser: 1.04 mg/dL (ref 0.50–1.10)
GFR calc Af Amer: 60 mL/min — ABNORMAL LOW (ref >90)
GFR, EST NON AFRICAN AMERICAN: 52 mL/min — AB (ref >90)
GLUCOSE: 188 mg/dL — AB (ref 70–99)
Potassium: 4.6 mmol/L (ref 3.5–5.1)
Sodium: 141 mmol/L (ref 135–145)

## 2014-01-24 MED ORDER — DIPHENHYDRAMINE HCL 25 MG PO CAPS: 25.0000 mg | ORAL_CAPSULE | Freq: Four times a day (QID) | ORAL | Status: DC | PRN

## 2014-01-24 MED ORDER — FAMOTIDINE 20 MG PO TABS: 20.0000 mg | ORAL_TABLET | Freq: Every day | ORAL | Status: DC

## 2014-01-24 MED ORDER — PREDNISONE 10 MG PO TABS: ORAL_TABLET | ORAL | Status: DC

## 2014-01-24 MED ORDER — AZITHROMYCIN 250 MG PO TABS: ORAL_TABLET | ORAL | Status: DC

## 2014-01-24 MED ORDER — POTASSIUM CHLORIDE CRYS ER 20 MEQ PO TBCR: 20.0000 meq | EXTENDED_RELEASE_TABLET | Freq: Every day | ORAL | Status: DC

## 2014-01-24 MED ORDER — PIPERACILLIN-TAZOBACTAM 3.375 G IVPB
3.3750 g | Freq: Three times a day (TID) | INTRAVENOUS | Status: DC
  Administered 2014-01-24 (×2): 3.375 g via INTRAVENOUS
  Filled 2014-01-24 (×3): qty 50

## 2014-01-24 NOTE — Discharge Instructions (Signed)

## 2014-01-24 NOTE — Discharge Summary (Signed)
Physician Discharge Summary  Cristina [name] DDU:[id number] DOB: 1940-06-11 DOA: 01/23/2014  PCP: Tawanna Solo, MD  Admit date: 01/23/2014 Discharge date: 01/24/2014  Recommendations for Outpatient Follow-up:  1. Pt will need to follow up with PCP in 2-3 weeks post discharge 2. Please obtain BMP to evaluate electrolytes and kidney function 3. Please also check CBC to evaluate Hg and Hct levels 4. Please note that Augment was marked as "allergy" as well as lisinopril as it was difficult to determine which medication contributed to angioedema  5. Since pt insisted on going home, pt advised to take benadryl and Pepcid at least for one more day post discharge to help with throat swelling and itching  6. Please note CT chest findings of two left upper lobe nodules in the apex with the larger measuring 1 cm. 1 cm right subcarinal lymph node likely reactive. Recommend three-month followup chest CT versus PET-CT for further evaluation.    7. Pt advised to continue Zithromax for 7 more days   Discharge Diagnoses:  Active Problems:   Anaphylactic reaction  Discharge Condition: Stable  Diet recommendation: Heart healthy diet discussed in details   History of present illness:  [name] 73 ? chronic Afib CHad2Vasc2 score~4-5 on eliquis, Asthma since Age 19,? Unclear diastolic CHF on ECHO 02/19/74, Gouty arthritis, Aortic Atherosclerosis, Morbid obesity, Body mass index is 30.37 kg/(m^2)., possible seasonal allergies. Started feeling poorly 12.25.15-she states she started having upper respiratory symptoms. Started on Augmentin and was taking zyrtec OTC but noticed progressive facial swelling, some dyspnea and difficulty swallowing. In the emergency room, pt was without any pharyngeal edema or any voice change and she was given IV epinephrine IV steroids IV Pepcid. Pt felt better but admitted for observation. CT scan of the head neck and chest was performed showing 2 left upper lobe nodules and mucosal  edema and it was thought that she might have infectious epiglottitis and 2 g of IV Rocephin were given for temperature 100.7.  Hospital Course:  Active Problems:   Anaphylactic reaction - possibly to augmenting vs ACEI - for now will mark both medications as potential "allergies" - pt wants to go home today - we discussed use of Pepcid and prednisone - also continue Zithromax for 7 more days post discharge  - please note rec's for repeat CT chest to ensure resolution of the lung nodules   Procedures/Studies: Dg Chest 2 View  01/23/2014  No acute infiltrate or pulmonary edema. Central mild bronchitic changes.    Ct Soft Tissue Neck W Contrast  01/23/2014    Mucosal edema a in the pharynx, primarily affecting the oropharynx but also with some epiglottis involvement. At the same time there is hyper enhancement of the bilateral parotid and submandibular glands. This constellation of findings can be seen following head and neck radiation (such as for previous head and neck cancer). But if there is no corresponding history of radiation treatment then presumably these changes relate to an acute infection or systemic inflammation. 2. Mild subcutaneous inflammation about the chin, such as due to mild cellulitis. No drainable fluid collection or abscess. 3. Poor dentition. Moderate inflammation of the right maxillary sinus. Chronic left maxillary sinusitis. 4. Chest CTA reported separately.   Ct Angio Chest Pe W/cm &/or Wo Cm  01/23/2014   No acute cardiopulmonary disease. No evidence of pulmonary embolism.  1.1 cm focus of dependent aspirated material over the proximal left mainstem bronchus at the level of the carina. Correlate with aspiration risk and possible future  development of aspiration pneumonia.  Two left upper lobe nodules in the apex with the larger measuring 1 cm. 1 cm right subcarinal lymph node likely reactive. Recommend three-month followup chest CT versus PET-CT for further evaluation.     Consultations:  None Antibiotics:    Discharge Exam: Filed Vitals:   01/24/14 0901  BP: 148/67  Pulse: 89  Temp:   Resp:    Filed Vitals:   01/23/14 1905 01/23/14 2000 01/24/14 0459 01/24/14 0901  BP: 107/51 139/61 95/61 148/67  Pulse: 97 81 81 89  Temp: 98.8 F (37.1 C) 98.6 F (37 C) 98.5 F (36.9 C)   TempSrc: Oral Oral Oral   Resp: 17 18 18    Height:  5\' 4"  (1.626 m)    Weight:  80.468 kg (177 lb 6.4 oz)    SpO2: 95% 98% 98%     General: Pt is alert, follows commands appropriately, not in acute distress Cardiovascular: Regular rate and rhythm, S1/S2 +, no murmurs, no rubs, no gallops Respiratory: Clear to auscultation bilaterally, no wheezing, no crackles, no rhonchi Abdominal: Soft, non tender, non distended, bowel sounds +, no guarding  Discharge Instructions  Discharge Instructions    Diet - low sodium heart healthy    Complete by:  As directed      Increase activity slowly    Complete by:  As directed             Medication List    STOP taking these medications        amoxicillin-clavulanate 875-125 MG per tablet  Commonly known as:  AUGMENTIN     lisinopril 40 MG tablet  Commonly known as:  PRINIVIL,ZESTRIL      TAKE these medications        allopurinol 100 MG tablet  Commonly known as:  ZYLOPRIM  Take 100 mg by mouth 2 (two) times daily.     atorvastatin 20 MG tablet  Commonly known as:  LIPITOR  Take 20 mg by mouth daily.     azithromycin 250 MG tablet  Commonly known as:  ZITHROMAX  Take 500 mg tablet today (2 tablet of 250 mg dose), continue taking 250 mg tablet daily starting 01/25/2014 for 7 more days     colchicine 0.6 MG tablet  Take 0.6 mg by mouth daily.     diphenhydrAMINE 25 mg capsule  Commonly known as:  BENADRYL  Take 1 capsule (25 mg total) by mouth every 6 (six) hours as needed for itching or allergies.     famotidine 20 MG tablet  Commonly known as:  PEPCID  Take 1 tablet (20 mg total) by mouth daily.      furosemide 20 MG tablet  Commonly known as:  LASIX  Take 20 mg by mouth daily.     metoprolol tartrate 25 MG tablet  Commonly known as:  LOPRESSOR  Take 25 mg by mouth 2 (two) times daily.     naproxen sodium 220 MG tablet  Commonly known as:  ANAPROX  Take 440 mg by mouth 2 (two) times daily as needed (for pain).     potassium chloride SA 20 MEQ tablet  Commonly known as:  K-DUR,KLOR-CON  Take 1 tablet (20 mEq total) by mouth daily. Resume taking January 10th, 2016     pramipexole 1 MG tablet  Commonly known as:  MIRAPEX  Take 2 mg by mouth at bedtime.     predniSONE 10 MG tablet  Commonly known as:  DELTASONE  Take 50  mg tablet today and taper down by 10 mg daily until completed     PROAIR HFA 108 (90 BASE) MCG/ACT inhaler  Generic drug:  albuterol  Inhale 2 puffs into the lungs every 6 (six) hours as needed. For shortness of breath and wheezing     rivaroxaban 20 MG Tabs tablet  Commonly known as:  XARELTO  Take 1 tablet (20 mg total) by mouth daily with supper.     verapamil 240 MG CR tablet  Commonly known as:  CALAN-SR  Take 240 mg by mouth at bedtime.           Follow-up Information    Follow up with Tawanna Solo, MD.   Specialty:  Family Medicine   Contact information:   [place] [address] [phone]       Follow up with Faye Ramsay, MD.   Specialty:  Internal Medicine   Why:  As needed, If symptoms worsen, call my cell phone  [phone]   Contact information:   [address] [place] [address] [phone]        The results of significant diagnostics from this hospitalization (including imaging, microbiology, ancillary and laboratory) are listed below for reference.     Microbiology: Recent Results (from the past 240 hour(s))  Rapid strep screen     Status: None   Collection Time: 01/23/14  4:00 PM  Result Value Ref Range Status   Streptococcus, Group A Screen (Direct) NEGATIVE  NEGATIVE Final    Comment: (NOTE) A Rapid Antigen test may result negative if the antigen level in the sample is below the detection level of this test. The FDA has not cleared this test as a stand-alone test therefore the rapid antigen negative result has reflexed to a Group A Strep culture.      Labs: Basic Metabolic Panel:  Recent Labs Lab 01/23/14 1220 01/24/14 0450  NA 135 139  K 4.0 5.2*  CL 104 102  CO2 27 23  GLUCOSE 110* 248*  BUN 25* 32*  CREATININE 0.87 1.16*  CALCIUM 8.9 8.5   Liver Function Tests:  Recent Labs Lab 01/24/14 0450  AST 29  ALT 25  ALKPHOS 109  BILITOT 0.7  PROT 5.7*  ALBUMIN 3.2*   CBC:  Recent Labs Lab 01/23/14 1220 01/24/14 0450  WBC 15.8* 12.2*  NEUTROABS 11.9*  --   HGB 15.5* 14.1  HCT 47.3* 43.3  MCV 92.9 93.5  PLT 281 208   Cardiac Enzymes: No results for input(s): CKTOTAL, CKMB, CKMBINDEX, TROPONINI in the last 168 hours. BNP: BNP (last 3 results)  Recent Labs  07/22/13 1710 07/24/13 0217  PROBNP 2246.0* 1013.0*   SIGNED: Time coordinating discharge: Over 30 minutes  Faye Ramsay, MD  [facility] 01/24/2014, 11:05 AM Pager [phone]  If 7PM-7AM, please contact night-coverage [email or web address] Password [email or web address]

## 2014-01-24 NOTE — Progress Notes (Signed)
Patient discharged home with daughter, discharge instructions given and explained to patient/daughter and she verbalized understanding, patient denies any pain/distress. Skin intact, no wound noted. Accompanied home by daughter. Transported to the car by staff.

## 2014-01-24 NOTE — Care Management Note (Signed)
    Page 1 of 1   01/24/2014     2:21:05 PM CARE MANAGEMENT NOTE 01/24/2014  Patient:  Cristina [name], Cristina [name]   Account Number:  [id number]  Date Initiated:  01/24/2014  Documentation initiated by:  Dessa Phi  Subjective/Objective Assessment:   74 y/o f anaphylatic shock, early sepsis.     Action/Plan:   From home.   Anticipated DC Date:  01/24/2014   Anticipated DC Plan:  [place]  CM consult      Choice offered to / List presented to:             Status of service:  Completed, signed off Medicare Important Message given?   (If response is "NO", the following Medicare IM given date fields will be blank) Date Medicare IM given:   Medicare IM given by:   Date Additional Medicare IM given:   Additional Medicare IM given by:    Discharge Disposition:  HOME/SELF CARE  Per UR Regulation:  Reviewed for med. necessity/level of care/duration of stay  If discussed at [place] of Stay Meetings, dates discussed:    Comments:  01/24/14 Dessa Phi RN BSN NCM [phone] No d/c needs or orders.

## 2014-01-24 NOTE — Progress Notes (Signed)
ANTIBIOTIC CONSULT NOTE - INITIAL  Pharmacy Consult for zosyn Indication: Asp. PNA  No Known Allergies  Patient Measurements: Height: 5\' 4"  (162.6 cm) Weight: 177 lb 6.4 oz (80.468 kg) IBW/kg (Calculated) : 54.7 Adjusted Body Weight:   Vital Signs: Temp: 98.5 F (36.9 C) (01/08 0459) Temp Source: Oral (01/08 0459) BP: 95/61 mmHg (01/08 0459) Pulse Rate: 81 (01/08 0459) Intake/Output from previous day:   Intake/Output from this shift:    Labs:  Recent Labs  01/23/14 1220 01/24/14 0450  WBC 15.8* 12.2*  HGB 15.5* 14.1  PLT 281 208  CREATININE 0.87 1.16*   Estimated Creatinine Clearance: 43.7 mL/min (by C-G formula based on Cr of 1.16). No results for input(s): VANCOTROUGH, VANCOPEAK, VANCORANDOM, GENTTROUGH, GENTPEAK, GENTRANDOM, TOBRATROUGH, TOBRAPEAK, TOBRARND, AMIKACINPEAK, AMIKACINTROU, AMIKACIN in the last 72 hours.   Microbiology: Recent Results (from the past 720 hour(s))  Rapid strep screen     Status: None   Collection Time: 01/23/14  4:00 PM  Result Value Ref Range Status   Streptococcus, Group A Screen (Direct) NEGATIVE NEGATIVE Final    Comment: (NOTE) A Rapid Antigen test may result negative if the antigen level in the sample is below the detection level of this test. The FDA has not cleared this test as a stand-alone test therefore the rapid antigen negative result has reflexed to a Group A Strep culture.     Medical History: Past Medical History  Diagnosis Date  . Hypertension   . Asthma   . Atrial fibrillation     Medications:  Anti-infectives    Start     Dose/Rate Route Frequency Ordered Stop   01/24/14 0615  piperacillin-tazobactam (ZOSYN) IVPB 3.375 g     3.375 g12.5 mL/hr over 240 Minutes Intravenous 3 times per day 01/24/14 0601     01/23/14 1545  cefTRIAXone (ROCEPHIN) 2 g in dextrose 5 % 50 mL IVPB    Comments:  Possible epiglotitis   2 g100 mL/hr over 30 Minutes Intravenous  Once 01/23/14 1539 01/23/14 1723      Assessment: Patient with asp. PNA. Patient admitted with possible anaphylactic reaction to ACE-I.  Goal of Therapy:  Zosyn based on renal function   Plan:  Follow up culture results  Zosyn 3.375g IV Q8H infused over 4hrs.   Tyler Deis, Shea Stakes [place]ford 01/24/2014,6:19 AM

## 2014-01-25 LAB — CULTURE, GROUP A STREP

## 2014-04-18 ENCOUNTER — Other Ambulatory Visit: Payer: Self-pay | Admitting: Family Medicine

## 2014-04-18 DIAGNOSIS — R918 Other nonspecific abnormal finding of lung field: Secondary | ICD-10-CM

## 2014-04-25 ENCOUNTER — Other Ambulatory Visit: Payer: Self-pay | Admitting: Family Medicine

## 2014-04-25 DIAGNOSIS — R29898 Other symptoms and signs involving the musculoskeletal system: Secondary | ICD-10-CM

## 2014-04-25 DIAGNOSIS — Z9889 Other specified postprocedural states: Secondary | ICD-10-CM

## 2014-04-25 DIAGNOSIS — M545 Low back pain: Secondary | ICD-10-CM

## 2014-04-28 ENCOUNTER — Other Ambulatory Visit: Payer: Medicare Other

## 2014-04-28 ENCOUNTER — Inpatient Hospital Stay: Admission: RE | Admit: 2014-04-28 | Payer: Medicare Other | Source: Ambulatory Visit

## 2014-04-29 ENCOUNTER — Other Ambulatory Visit: Payer: Self-pay | Admitting: Family Medicine

## 2014-04-29 ENCOUNTER — Ambulatory Visit
Admission: RE | Admit: 2014-04-29 | Discharge: 2014-04-29 | Disposition: A | Discharge status: Home / Self Care | Payer: Medicare Other | Source: Ambulatory Visit | Attending: Family Medicine | Admitting: Family Medicine

## 2014-04-29 DIAGNOSIS — M545 Low back pain: Secondary | ICD-10-CM

## 2014-04-29 DIAGNOSIS — Z9889 Other specified postprocedural states: Secondary | ICD-10-CM

## 2014-04-29 DIAGNOSIS — R29898 Other symptoms and signs involving the musculoskeletal system: Secondary | ICD-10-CM

## 2014-04-29 DIAGNOSIS — R918 Other nonspecific abnormal finding of lung field: Secondary | ICD-10-CM

## 2014-05-16 ENCOUNTER — Other Ambulatory Visit: Payer: Medicare Other

## 2014-10-03 ENCOUNTER — Telehealth: Payer: Self-pay | Admitting: Cardiology

## 2014-10-03 MED ORDER — FUROSEMIDE 20 MG PO TABS: 20.0000 mg | ORAL_TABLET | Freq: Every day | ORAL | Status: DC

## 2014-10-03 MED ORDER — METOPROLOL TARTRATE 25 MG PO TABS: 25.0000 mg | ORAL_TABLET | Freq: Two times a day (BID) | ORAL | Status: DC

## 2014-10-03 MED ORDER — RIVAROXABAN 20 MG PO TABS: 20.0000 mg | ORAL_TABLET | Freq: Every day | ORAL | Status: DC

## 2014-10-03 NOTE — Telephone Encounter (Signed)
Rx(s) sent to pharmacy electronically. LM that meds were refilled - NOT allopurinol and colchicine

## 2014-10-03 NOTE — Telephone Encounter (Signed)
New Prob   1. Which medications need to be refilled? Xarelto, Metoprolol Tartrate, Furosemide, Allopurinol, Cochicine   2. Which pharmacy is medication to be sent to? [facility] on [address]  3. Do they need a 30 day or 90 day supply? 90 days  4. Would they like a call back once the medication has been sent to the pharmacy? Yes   Appointment scheduled with Dr. Stanford Breed on 11/3

## 2014-10-09 ENCOUNTER — Other Ambulatory Visit: Payer: Self-pay | Admitting: Cardiology

## 2014-10-10 NOTE — Telephone Encounter (Signed)
Should these be deferred to pcp?

## 2014-10-10 NOTE — Telephone Encounter (Signed)
Should be refilled by primary care Cristina Cristina

## 2014-10-14 ENCOUNTER — Telehealth: Payer: Self-pay | Admitting: Cardiology

## 2014-10-14 NOTE — Telephone Encounter (Signed)
°  1. Which medications need to be refilled? Colchcine and Furosemide  2. Which pharmacy is medication to be sent to? [facility] [phone]  3. Do they need a 30 day or 90 day supply? 30 and refills  4. Would they like a call back once the medication has been sent to the pharmacy? yes

## 2014-10-15 ENCOUNTER — Other Ambulatory Visit: Payer: Self-pay

## 2014-10-15 NOTE — Telephone Encounter (Signed)
Furosemide already filled on 09/16. States Colchicine was for foot pain, possibly gout. Advised her she would need to get that from her PCP

## 2014-11-20 ENCOUNTER — Ambulatory Visit: Payer: Medicare Other | Admitting: Cardiology

## 2014-12-22 NOTE — Progress Notes (Signed)
HPI: FU atrial fibrillation. Patient admitted in July of 2015 with atrial fibrillation and CHF symptoms. Echocardiogram in July 2015 showed normal LV function and mild biatrial enlargement. TSH 2.980. Patient was treated with rate control and diuretics with improvement in symptoms. Cardioversion was scheduled but when patient arrived for the procedure in October 2015 she was in sinus rhythm. Had possible angioedema with ACE inhibitor. Since last seen,   Current Outpatient Prescriptions  Medication Sig Dispense Refill  . allopurinol (ZYLOPRIM) 100 MG tablet Take 100 mg by mouth 2 (two) times daily.    [name] Kitchen atorvastatin (LIPITOR) 20 MG tablet Take 20 mg by mouth daily.    [name] Kitchen azithromycin (ZITHROMAX) 250 MG tablet Take 500 mg tablet today (2 tablet of 250 mg dose), continue taking 250 mg tablet daily starting 01/25/2014 for 7 more days 9 tablet 0  . colchicine 0.6 MG tablet Take 0.6 mg by mouth daily.    . diphenhydrAMINE (BENADRYL) 25 mg capsule Take 1 capsule (25 mg total) by mouth every 6 (six) hours as needed for itching or allergies. 30 capsule 0  . famotidine (PEPCID) 20 MG tablet Take 1 tablet (20 mg total) by mouth daily. 7 tablet 0  . furosemide (LASIX) 20 MG tablet Take 1 tablet (20 mg total) by mouth daily. 90 tablet 0  . metoprolol tartrate (LOPRESSOR) 25 MG tablet Take 1 tablet (25 mg total) by mouth 2 (two) times daily. 180 tablet 0  . naproxen sodium (ANAPROX) 220 MG tablet Take 440 mg by mouth 2 (two) times daily as needed (for pain).     . potassium chloride SA (K-DUR,KLOR-CON) 20 MEQ tablet Take 1 tablet (20 mEq total) by mouth daily. Resume taking January 10th, 2016    . pramipexole (MIRAPEX) 1 MG tablet Take 2 mg by mouth at bedtime.     . predniSONE (DELTASONE) 10 MG tablet Take 50 mg tablet today and taper down by 10 mg daily until completed 15 tablet 0  . PROAIR HFA 108 (90 BASE) MCG/ACT inhaler Inhale 2 puffs into the lungs every 6 (six) hours as needed. For shortness of  breath and wheezing  0  . rivaroxaban (XARELTO) 20 MG TABS tablet Take 1 tablet (20 mg total) by mouth daily with supper. 90 tablet 0  . verapamil (CALAN-SR) 240 MG CR tablet Take 240 mg by mouth at bedtime.     No current facility-administered medications for this visit.     Past Medical History  Diagnosis Date  . Hypertension   . Asthma   . Atrial fibrillation     Past Surgical History  Procedure Laterality Date  . Back surgery      Social History   Social History  . Marital Status: Divorced    Spouse Name: N/A  . Number of Children: N/A  . Years of Education: N/A   Occupational History  . Retired    Social History Main Topics  . Smoking status: Never Smoker   . Smokeless tobacco: Never Used  . Alcohol Use: Yes     Comment: occ  . Drug Use: Not on file  . Sexual Activity: Not on file   Other Topics Concern  . Not on file   Social History Narrative   Lives in [place] since 1982   Divorced   G6P4   daughters live close   Retired from [employer]   Non smoker/dirnks occ wine          ROS: no fevers  or chills, productive cough, hemoptysis, dysphasia, odynophagia, melena, hematochezia, dysuria, hematuria, rash, seizure activity, orthopnea, PND, pedal edema, claudication. Remaining systems are negative.  Physical Exam: Well-developed well-nourished in no acute distress.  Skin is warm and dry.  HEENT is normal.  Neck is supple.  Chest is clear to auscultation with normal expansion.  Cardiovascular exam is regular rate and rhythm.  Abdominal exam nontender or distended. No masses palpated. Extremities show no edema. neuro grossly intact  ECG     This encounter was created in error - please disregard.

## 2014-12-23 ENCOUNTER — Encounter: Payer: Medicare Other | Admitting: Cardiology

## 2015-01-14 ENCOUNTER — Other Ambulatory Visit: Payer: Self-pay | Admitting: Cardiology

## 2015-03-05 ENCOUNTER — Other Ambulatory Visit: Payer: Self-pay | Admitting: *Deleted

## 2015-03-06 ENCOUNTER — Other Ambulatory Visit: Payer: Self-pay | Admitting: *Deleted

## 2015-03-06 MED ORDER — RIVAROXABAN 20 MG PO TABS: 20.0000 mg | ORAL_TABLET | Freq: Every day | ORAL | Status: AC

## 2015-03-06 MED ORDER — METOPROLOL TARTRATE 25 MG PO TABS: 25.0000 mg | ORAL_TABLET | Freq: Two times a day (BID) | ORAL | Status: DC

## 2015-03-06 MED ORDER — FUROSEMIDE 20 MG PO TABS: 20.0000 mg | ORAL_TABLET | Freq: Every day | ORAL | Status: DC

## 2015-03-06 NOTE — Telephone Encounter (Signed)
pt called asking for refills on xarelto, lasix & metoprolol. let pt know that we would send 30 days only bc she is over due for an appt and that she needs to make one, she understands and stated she will make an appt.

## 2015-04-15 ENCOUNTER — Other Ambulatory Visit (HOSPITAL_COMMUNITY): Payer: Self-pay | Admitting: Respiratory Therapy

## 2015-04-15 DIAGNOSIS — R0602 Shortness of breath: Secondary | ICD-10-CM

## 2015-08-12 ENCOUNTER — Emergency Department (HOSPITAL_COMMUNITY): Payer: Medicare Other

## 2015-08-12 ENCOUNTER — Encounter (HOSPITAL_COMMUNITY): Payer: Self-pay | Admitting: Emergency Medicine

## 2015-08-12 ENCOUNTER — Emergency Department (HOSPITAL_COMMUNITY)
Admission: EM | Admit: 2015-08-12 | Discharge: 2015-08-13 | Disposition: A | Discharge status: Home / Self Care | Payer: Medicare Other | Attending: Emergency Medicine | Admitting: Emergency Medicine

## 2015-08-12 DIAGNOSIS — W109XXA Fall (on) (from) unspecified stairs and steps, initial encounter: Secondary | ICD-10-CM | POA: Diagnosis not present

## 2015-08-12 DIAGNOSIS — S92102A Unspecified fracture of left talus, initial encounter for closed fracture: Secondary | ICD-10-CM | POA: Insufficient documentation

## 2015-08-12 DIAGNOSIS — Y9301 Activity, walking, marching and hiking: Secondary | ICD-10-CM | POA: Insufficient documentation

## 2015-08-12 DIAGNOSIS — M25572 Pain in left ankle and joints of left foot: Secondary | ICD-10-CM | POA: Diagnosis present

## 2015-08-12 DIAGNOSIS — Y9289 Other specified places as the place of occurrence of the external cause: Secondary | ICD-10-CM | POA: Diagnosis not present

## 2015-08-12 DIAGNOSIS — Y999 Unspecified external cause status: Secondary | ICD-10-CM | POA: Diagnosis not present

## 2015-08-12 DIAGNOSIS — J45909 Unspecified asthma, uncomplicated: Secondary | ICD-10-CM | POA: Insufficient documentation

## 2015-08-12 DIAGNOSIS — I5032 Chronic diastolic (congestive) heart failure: Secondary | ICD-10-CM | POA: Insufficient documentation

## 2015-08-12 DIAGNOSIS — I4891 Unspecified atrial fibrillation: Secondary | ICD-10-CM | POA: Diagnosis not present

## 2015-08-12 DIAGNOSIS — I110 Hypertensive heart disease with heart failure: Secondary | ICD-10-CM | POA: Diagnosis not present

## 2015-08-12 DIAGNOSIS — Z79899 Other long term (current) drug therapy: Secondary | ICD-10-CM | POA: Insufficient documentation

## 2015-08-12 DIAGNOSIS — S82402A Unspecified fracture of shaft of left fibula, initial encounter for closed fracture: Secondary | ICD-10-CM

## 2015-08-12 LAB — BASIC METABOLIC PANEL
Anion gap: 7 (ref 5–15)
BUN: 20 mg/dL (ref 6–20)
CALCIUM: 8.6 mg/dL — AB (ref 8.9–10.3)
CHLORIDE: 107 mmol/L (ref 101–111)
CO2: 28 mmol/L (ref 22–32)
CREATININE: 0.68 mg/dL (ref 0.44–1.00)
GFR calc non Af Amer: >60 mL/min (ref >60)
Glucose, Bld: 103 mg/dL — ABNORMAL HIGH (ref 65–99)
Potassium: 4.6 mmol/L (ref 3.5–5.1)
SODIUM: 142 mmol/L (ref 135–145)

## 2015-08-12 LAB — CBC
HCT: 39.6 % (ref 36.0–46.0)
HEMOGLOBIN: 12.9 g/dL (ref 12.0–15.0)
MCH: 29.3 pg (ref 26.0–34.0)
MCHC: 32.6 g/dL (ref 30.0–36.0)
MCV: 90 fL (ref 78.0–100.0)
PLATELETS: 261 10*3/uL (ref 150–400)
RBC: 4.4 MIL/uL (ref 3.87–5.11)
RDW: 14.7 % (ref 11.5–15.5)
WBC: 13 10*3/uL — AB (ref 4.0–10.5)

## 2015-08-12 MED ORDER — PROPOFOL 1000 MG/100ML IV EMUL
INTRAVENOUS | Status: AC
  Filled 2015-08-12: qty 100

## 2015-08-12 MED ORDER — MORPHINE SULFATE (PF) 4 MG/ML IV SOLN
4.0000 mg | Freq: Once | INTRAVENOUS | Status: AC
  Administered 2015-08-12: 4 mg via INTRAVENOUS
  Filled 2015-08-12: qty 1

## 2015-08-12 MED ORDER — FENTANYL CITRATE (PF) 100 MCG/2ML IJ SOLN
50.0000 ug | INTRAMUSCULAR | Status: DC | PRN
  Administered 2015-08-12: 50 ug via INTRAVENOUS
  Filled 2015-08-12: qty 2

## 2015-08-12 MED ORDER — MIDAZOLAM HCL 2 MG/2ML IJ SOLN
2.0000 mg | Freq: Once | INTRAMUSCULAR | Status: AC
  Administered 2015-08-12: 2 mg via INTRAVENOUS
  Filled 2015-08-12: qty 2

## 2015-08-12 MED ORDER — HYDROMORPHONE HCL 1 MG/ML IJ SOLN
1.0000 mg | Freq: Once | INTRAMUSCULAR | Status: AC
  Administered 2015-08-12: 1 mg via INTRAVENOUS
  Filled 2015-08-12: qty 1

## 2015-08-12 MED ORDER — OXYCODONE HCL 5 MG PO TABS: 5.0000 mg | ORAL_TABLET | Freq: Four times a day (QID) | ORAL | 0 refills | Status: DC | PRN

## 2015-08-12 NOTE — Discharge Instructions (Signed)
Please read and follow all provided instructions.  Your diagnoses today include:  1. Fibula fracture, left, closed, initial encounter    Tests performed today include: Vital signs. See below for your results today.   Medications prescribed:  Take as prescribed   Home care instructions:  Follow any educational materials contained in this packet. Do not put weight on the ankle. Use crutches or a wheelchair.   Follow-up instructions: Please follow-up with Orthopedics (Dr. Erlinda Hong) for further evaluation of symptoms and treatment. The office will call you to set and appointment for next week.   Return instructions:  Please return to the Emergency Department if you do not get better, if you get worse, or new symptoms OR  - Fever (temperature greater than 101.53F)  - Bleeding that does not stop with holding pressure to the area    -Severe pain (please note that you may be more sore the day after your accident)  - Chest Pain  - Difficulty breathing  - Severe nausea or vomiting  - Inability to tolerate food and liquids  - Passing out  - Skin becoming red around your wounds  - Change in mental status (confusion or lethargy)  - New numbness or weakness    Please return if you have any other emergent concerns.  Additional Information:  Your vital signs today were: BP 142/89 (BP Location: Left Arm)    Pulse 88    Temp 98.3 F (36.8 C) (Oral)    Resp 14    SpO2 94%  If your blood pressure (BP) was elevated above 135/85 this visit, please have this repeated by your doctor within one month. ---------------

## 2015-08-12 NOTE — ED Triage Notes (Signed)
Pt c/o left ankle pain after falling walking down the steps to the pool. Swelling and bruising present. Denies head injury and LOC.

## 2015-08-12 NOTE — ED Provider Notes (Signed)
  Face-to-face evaluation   History: She tripped, getting into a pool injuring her left ankle. Unable to pay her weight since. No other injury.   Physical exam: Alert, calm, cooperative. Moderate left ankle swelling, and ecchymosis primarily medially. No tenderness of the mid or proximal left lower leg. Normal range of motion left knee.  Medical screening examination/treatment/procedure(s) were conducted as a shared visit with non-physician practitioner(s) and myself.  I personally evaluated the patient during the encounter   Daleen Bo, MD 08/14/15 1255

## 2015-08-12 NOTE — ED Triage Notes (Signed)
Pt talking and maintaining airway without problems

## 2015-08-12 NOTE — ED Provider Notes (Signed)
[facility] DEPT Provider Note   CSN: [id number] Arrival date & time: 08/12/15  1750  First Provider Contact:  First MD Initiated Contact with Patient 08/12/15 2030     History   Chief Complaint Chief Complaint  Patient presents with  . Fall   HPI Cristina [name] is a 75 y.o. female.  HPI  75 y.o. female with a hx of Atrial Fib, presents to the Emergency Department today complaining of left ankle pain s/p mechanical fall. No head trauma or LOC. Pt states that she was walking into the pool and slipped on a step and twisted her ankle. Noted immediate pain. Notes swelling and discoloration. Unable to ambulate due to pain. States pain currently is 10/10 and throbbing. Accident occurred around 4pm. Last PO intake 1430. Pt with hx of Afib and is on Xarelto. No CP/SOB/ABD pain. No N/V/D. No headaches. No numbness/tingling. No other symptoms noted.   Past Medical History:  Diagnosis Date  . Asthma   . Atrial fibrillation ([place])   . Hypertension     Patient Active Problem List   Diagnosis Date Noted  . Anaphylactic reaction 01/23/2014  . Essential hypertension 08/07/2013  . Atrial fibrillation ([place]) 07/28/2013  . Hypertensive heart disease 07/28/2013  . Chronic diastolic congestive heart failure ([place]) 07/28/2013  . Obesity (BMI 30-39.9)   . Gout   . Current use of long term anticoagulation   . Acute diastolic CHF (congestive heart failure) ([place]) 07/24/2013    Past Surgical History:  Procedure Laterality Date  . BACK SURGERY      OB History    No data available      Home Medications    Prior to Admission medications   Medication Sig Start Date End Date Taking? Authorizing Provider  albuterol (PROVENTIL HFA;VENTOLIN HFA) 108 (90 Base) MCG/ACT inhaler Inhale 2 puffs into the lungs every 6 (six) hours as needed for wheezing or shortness of breath.   Yes Historical Provider, MD  atorvastatin (LIPITOR) 20 MG tablet Take 20 mg by mouth at bedtime.    Yes Historical  Provider, MD  citalopram (CELEXA) 20 MG tablet Take 20 mg by mouth at bedtime.   Yes Historical Provider, MD  colchicine 0.6 MG tablet Take 0.6 mg by mouth 2 (two) times daily as needed (for gout flares).    Yes Jacolyn Reedy, MD  metoprolol succinate (TOPROL-XL) 100 MG 24 hr tablet Take 100 mg by mouth at bedtime. Take with or immediately following a meal.   Yes Historical Provider, MD  naproxen sodium (ANAPROX) 220 MG tablet Take 440 mg by mouth 2 (two) times daily as needed (for pain).    Yes Historical Provider, MD  pramipexole (MIRAPEX) 1 MG tablet Take 2 mg by mouth at bedtime.    Yes Historical Provider, MD  rivaroxaban (XARELTO) 20 MG TABS tablet Take 1 tablet (20 mg total) by mouth daily with supper. 03/06/15  Yes Lelon Perla, MD  verapamil (CALAN-SR) 240 MG CR tablet Take 240 mg by mouth at bedtime.   Yes Historical Provider, MD  Vitamin D, Ergocalciferol, (DRISDOL) 50000 units CAPS capsule Take 50,000 Units by mouth every 7 (seven) days. Pt takes on Tuesday.   Yes Historical Provider, MD  furosemide (LASIX) 20 MG tablet Take 1 tablet (20 mg total) by mouth daily. Patient not taking: Reported on 08/12/2015 03/06/15   Lelon Perla, MD    Family History Family History  Problem Relation Age of Onset  . CAD  38  father-mulitple  . Emphysema      mother  . Diabetes Mellitus II      father    Social History Social History  Substance Use Topics  . Smoking status: Never Smoker  . Smokeless tobacco: Never Used  . Alcohol use Yes     Comment: occ     Allergies   Augmentin [amoxicillin-pot clavulanate] and Lisinopril   Review of Systems Review of Systems ROS reviewed and all are negative for acute change except as noted in the HPI.  Physical Exam Updated Vital Signs BP (!) 203/178 (BP Location: Left Arm)   Pulse 98   Temp 98.3 F (36.8 C) (Oral)   Resp 20   SpO2 96%   Physical Exam  Constitutional: She is oriented to person, place, and time. Vital signs  are normal. She appears well-developed and well-nourished.  HENT:  Head: Normocephalic.  Right Ear: Hearing normal.  Left Ear: Hearing normal.  Eyes: Conjunctivae and EOM are normal. Pupils are equal, round, and reactive to light.  Neck: Normal range of motion. Neck supple. No tracheal deviation present.  Cardiovascular: Normal rate, regular rhythm, normal heart sounds and intact distal pulses.   Pulmonary/Chest: Effort normal and breath sounds normal. No respiratory distress. She has no wheezes. She has no rales. She exhibits no tenderness.  Musculoskeletal:       Left ankle: She exhibits decreased range of motion, swelling and ecchymosis. She exhibits no deformity. Tenderness. Medial malleolus tenderness found.  Obvious swelling noted on Left ankle. Eccymosis around medial malleolus. TTP along lateral malleolus. Decrease ROM. Distal pulses intact. Motor/sensation intact. Pt does have bilateral pitting edema as well 1+.   Neurological: She is alert and oriented to person, place, and time.  Skin: Skin is warm and dry.  Psychiatric: She has a normal mood and affect. Her speech is normal and behavior is normal. Thought content normal.   ED Treatments / Results  Labs (all labs ordered are listed, but only abnormal results are displayed) Labs Reviewed  CBC - Abnormal; Notable for the following:       Result Value   WBC 13.0 (*)    All other components within normal limits  BASIC METABOLIC PANEL - Abnormal; Notable for the following:    Glucose, Bld 103 (*)    Calcium 8.6 (*)    All other components within normal limits   EKG  EKG Interpretation None      Radiology Dg Ankle Complete Left  Result Date: 08/12/2015 CLINICAL DATA:  Fall while walking down steps with pain and swelling, status post reduction EXAM: LEFT ANKLE COMPLETE - 3+ VIEW COMPARISON:  08/12/2015 FINDINGS: The previously seen fracture has been somewhat reduced. There remains some widening of the ankle mortise medially  although reduced from the prior exam. No other focal abnormality is noted. IMPRESSION: Slight reduction in fracture and displacement of the talus with respect to the tibia. Electronically Signed   By: Inez Catalina M.D.   On: 08/12/2015 23:14  Dg Ankle Complete Left  Result Date: 08/12/2015 CLINICAL DATA:  Left ankle pain, status post fall, bruising/swelling EXAM: LEFT ANKLE COMPLETE - 3+ VIEW COMPARISON:  None. FINDINGS: Mildly displaced oblique distal fibular fracture, with approximately 1/2 shaft width lateral displacement of the lateral malleolus. Widening of the medial malleolus. Moderate lateral soft tissue swelling. The base of the fifth metatarsal is unremarkable. IMPRESSION: Mildly displaced oblique distal fibular fracture. Widening of the medial ankle mortise. Moderate lateral soft tissue swelling. Electronically Signed  By: Julian Hy M.D.   On: 08/12/2015 18:52  Procedures Reduction of Fracture Date/Time: 08/12/2015 11:35 PM Performed by: Shary Decamp Authorized by: Shary Decamp  Consent: Verbal consent obtained. Consent given by: patient Patient understanding: patient states understanding of the procedure being performed Patient identity confirmed: verbally with patient and arm band  Sedation: Patient sedated: yes Sedatives: midazolam Analgesia: hydromorphone Vitals: Vital signs were monitored during sedation. Patient tolerance: Patient tolerated the procedure well with no immediate complications    (including critical care time)  Medications Ordered in ED Medications  fentaNYL (SUBLIMAZE) injection 50 mcg (50 mcg Intravenous Given 08/12/15 1929)   Initial Impression / Assessment and Plan / ED Course  I have reviewed the triage vital signs and the nursing notes.  Pertinent labs & imaging results that were available during my care of the patient were reviewed by me and considered in my medical decision making (see chart for details).  Clinical Course   Final  Clinical Impressions(s) / ED Diagnoses  I have reviewed and evaluated the relevant laboratory values I have reviewed and evaluated the relevant imaging studies. I have reviewed the relevant previous healthcare records.I obtained HPI from historian. Patient discussed with supervising physician  ED Course:  Assessment: Pt is a 75yF with hx Afib on Xarelto who presents with left ankle pain s/p mechanical fall. No LOC. No head trauma. On exam, pt in NAD. Nontoxic/nonseptic appearing. VSS. Afebrile. Lungs CTA. Heart RRR. Left ankle neurovascularly intact. Swelling noted. Ecchymosis along medial malleolus. TTP. XR shows mild displaced oblique distal fibular fracture with widening of the medial ankle mortise. Given analgesia in ED. Consult with Ortho. Will reduce in ED and splint. Given Crutches. Ortho office will call for appointment. Reduced ankle in ED with reduction using dilaudid and versed. Neurovascularly intact. Pt NWB. Plan is to DC home with follow up to Orthopedics (Dr. Erlinda Hong)   Disposition/Plan:  [place] with follow up to Ortho Pt acknowledges and agrees with plan  Supervising Physician Daleen Bo, MD   Final diagnoses:  Fibula fracture, left, closed, initial encounter    New Prescriptions New Prescriptions   No medications on file     Shary Decamp, PA-C 08/12/15 [place], MD 08/14/15 1254

## 2015-08-14 ENCOUNTER — Other Ambulatory Visit: Payer: Self-pay | Admitting: Orthopaedic Surgery

## 2015-08-18 ENCOUNTER — Encounter (HOSPITAL_COMMUNITY): Payer: Self-pay | Admitting: *Deleted

## 2015-08-18 NOTE — Progress Notes (Signed)
Cristina [name] has history of Afib, was to be cardioverted 10/25/13, but patient was in Sinus Rhythm when she arrived for procedure.  Patient has not followed up with cardiologist, but sees Dr Sabra Heck , PCP on a regular basis. EKG done at Dr [facility] office shows Afib, rate 102.  Patient denies chest pain. {atient has some shortness of breath "due to pain."

## 2015-08-19 ENCOUNTER — Encounter (HOSPITAL_COMMUNITY)
Admission: RE | Disposition: A | Discharge status: Skilled Nursing Facility | Payer: Self-pay | Source: Ambulatory Visit | Attending: Orthopaedic Surgery

## 2015-08-19 ENCOUNTER — Ambulatory Visit (HOSPITAL_COMMUNITY): Payer: Medicare Other | Admitting: Emergency Medicine

## 2015-08-19 ENCOUNTER — Ambulatory Visit (HOSPITAL_COMMUNITY): Payer: Medicare Other

## 2015-08-19 ENCOUNTER — Encounter (HOSPITAL_COMMUNITY): Payer: Self-pay | Admitting: Urology

## 2015-08-19 ENCOUNTER — Observation Stay (HOSPITAL_COMMUNITY)
Admission: RE | Admit: 2015-08-19 | Discharge: 2015-08-21 | Disposition: A | Discharge status: Skilled Nursing Facility | Payer: Medicare Other | Source: Ambulatory Visit | Attending: Orthopaedic Surgery | Admitting: Orthopaedic Surgery

## 2015-08-19 DIAGNOSIS — D62 Acute posthemorrhagic anemia: Secondary | ICD-10-CM | POA: Insufficient documentation

## 2015-08-19 DIAGNOSIS — E785 Hyperlipidemia, unspecified: Secondary | ICD-10-CM | POA: Diagnosis not present

## 2015-08-19 DIAGNOSIS — Z9889 Other specified postprocedural states: Secondary | ICD-10-CM

## 2015-08-19 DIAGNOSIS — Z8781 Personal history of (healed) traumatic fracture: Secondary | ICD-10-CM

## 2015-08-19 DIAGNOSIS — S82892A Other fracture of left lower leg, initial encounter for closed fracture: Secondary | ICD-10-CM | POA: Diagnosis present

## 2015-08-19 DIAGNOSIS — J45909 Unspecified asthma, uncomplicated: Secondary | ICD-10-CM | POA: Diagnosis not present

## 2015-08-19 DIAGNOSIS — I10 Essential (primary) hypertension: Secondary | ICD-10-CM | POA: Diagnosis not present

## 2015-08-19 DIAGNOSIS — W109XXA Fall (on) (from) unspecified stairs and steps, initial encounter: Secondary | ICD-10-CM | POA: Diagnosis not present

## 2015-08-19 DIAGNOSIS — Y9301 Activity, walking, marching and hiking: Secondary | ICD-10-CM | POA: Diagnosis not present

## 2015-08-19 DIAGNOSIS — I480 Paroxysmal atrial fibrillation: Secondary | ICD-10-CM | POA: Insufficient documentation

## 2015-08-19 DIAGNOSIS — Z7901 Long term (current) use of anticoagulants: Secondary | ICD-10-CM | POA: Insufficient documentation

## 2015-08-19 DIAGNOSIS — Z880 Allergy status to penicillin: Secondary | ICD-10-CM | POA: Diagnosis not present

## 2015-08-19 DIAGNOSIS — Z8249 Family history of ischemic heart disease and other diseases of the circulatory system: Secondary | ICD-10-CM | POA: Diagnosis not present

## 2015-08-19 DIAGNOSIS — R262 Difficulty in walking, not elsewhere classified: Secondary | ICD-10-CM | POA: Diagnosis not present

## 2015-08-19 DIAGNOSIS — Z419 Encounter for procedure for purposes other than remedying health state, unspecified: Secondary | ICD-10-CM

## 2015-08-19 DIAGNOSIS — R531 Weakness: Secondary | ICD-10-CM | POA: Insufficient documentation

## 2015-08-19 HISTORY — DX: Cardiac arrhythmia, unspecified: I49.9

## 2015-08-19 HISTORY — DX: Personal history of other diseases of the musculoskeletal system and connective tissue: Z87.39

## 2015-08-19 HISTORY — DX: Pneumonia, unspecified organism: J18.9

## 2015-08-19 HISTORY — DX: Anxiety disorder, unspecified: F41.9

## 2015-08-19 HISTORY — PX: ORIF ANKLE FRACTURE: SHX5408

## 2015-08-19 HISTORY — DX: Hyperlipidemia, unspecified: E78.5

## 2015-08-19 LAB — CBC
HCT: 44.7 % (ref 36.0–46.0)
Hemoglobin: 14.7 g/dL (ref 12.0–15.0)
MCH: 29.3 pg (ref 26.0–34.0)
MCHC: 32.9 g/dL (ref 30.0–36.0)
MCV: 89.2 fL (ref 78.0–100.0)
PLATELETS: 310 10*3/uL (ref 150–400)
RBC: 5.01 MIL/uL (ref 3.87–5.11)
RDW: 14.4 % (ref 11.5–15.5)
WBC: 12 10*3/uL — AB (ref 4.0–10.5)

## 2015-08-19 LAB — BASIC METABOLIC PANEL
Anion gap: 11 (ref 5–15)
BUN: 17 mg/dL (ref 6–20)
CALCIUM: 9.5 mg/dL (ref 8.9–10.3)
CO2: 29 mmol/L (ref 22–32)
CREATININE: 0.77 mg/dL (ref 0.44–1.00)
Chloride: 102 mmol/L (ref 101–111)
GFR calc non Af Amer: >60 mL/min (ref >60)
Glucose, Bld: 117 mg/dL — ABNORMAL HIGH (ref 65–99)
Potassium: 3.8 mmol/L (ref 3.5–5.1)
SODIUM: 142 mmol/L (ref 135–145)

## 2015-08-19 SURGERY — OPEN REDUCTION INTERNAL FIXATION (ORIF) ANKLE FRACTURE
Anesthesia: General | Site: Ankle | Laterality: Left

## 2015-08-19 MED ORDER — OXYCODONE HCL 5 MG PO TABS: 5.0000 mg | ORAL_TABLET | ORAL | 0 refills | Status: DC | PRN

## 2015-08-19 MED ORDER — OXYCODONE HCL ER 10 MG PO T12A: 10.0000 mg | EXTENDED_RELEASE_TABLET | Freq: Two times a day (BID) | ORAL | 0 refills | Status: DC

## 2015-08-19 MED ORDER — ACETAMINOPHEN 325 MG PO TABS: 650.0000 mg | ORAL_TABLET | Freq: Four times a day (QID) | ORAL | Status: DC | PRN

## 2015-08-19 MED ORDER — PRAMIPEXOLE DIHYDROCHLORIDE 1 MG PO TABS
2.0000 mg | ORAL_TABLET | Freq: Every day | ORAL | Status: DC
  Administered 2015-08-19 – 2015-08-20 (×2): 2 mg via ORAL
  Filled 2015-08-19 (×2): qty 2

## 2015-08-19 MED ORDER — PHENYLEPHRINE HCL 10 MG/ML IJ SOLN
INTRAMUSCULAR | Status: DC | PRN
  Administered 2015-08-19: 40 ug/min via INTRAVENOUS
  Administered 2015-08-19: 20 ug/min via INTRAVENOUS

## 2015-08-19 MED ORDER — PROPOFOL 10 MG/ML IV BOLUS
INTRAVENOUS | Status: AC
  Filled 2015-08-19: qty 40

## 2015-08-19 MED ORDER — CLINDAMYCIN PHOSPHATE 900 MG/50ML IV SOLN
900.0000 mg | INTRAVENOUS | Status: AC
  Administered 2015-08-19: 900 mg via INTRAVENOUS
  Filled 2015-08-19 (×2): qty 50

## 2015-08-19 MED ORDER — KETOROLAC TROMETHAMINE 30 MG/ML IJ SOLN
INTRAMUSCULAR | Status: AC
  Administered 2015-08-19: 30 mg via INTRAVENOUS
  Filled 2015-08-19: qty 1

## 2015-08-19 MED ORDER — VITAMIN D (ERGOCALCIFEROL) 1.25 MG (50000 UNIT) PO CAPS: 50000.0000 [IU] | ORAL_CAPSULE | ORAL | Status: DC

## 2015-08-19 MED ORDER — BUPIVACAINE HCL (PF) 0.25 % IJ SOLN
INTRAMUSCULAR | Status: DC | PRN
  Administered 2015-08-19: 10 mL

## 2015-08-19 MED ORDER — FENTANYL CITRATE (PF) 250 MCG/5ML IJ SOLN
INTRAMUSCULAR | Status: AC
  Filled 2015-08-19: qty 5

## 2015-08-19 MED ORDER — METOPROLOL SUCCINATE ER 100 MG PO TB24
100.0000 mg | ORAL_TABLET | Freq: Every day | ORAL | Status: DC
  Administered 2015-08-19 – 2015-08-20 (×2): 100 mg via ORAL
  Filled 2015-08-19 (×2): qty 1

## 2015-08-19 MED ORDER — CLINDAMYCIN PHOSPHATE 600 MG/50ML IV SOLN
600.0000 mg | Freq: Four times a day (QID) | INTRAVENOUS | Status: AC
  Administered 2015-08-19 – 2015-08-20 (×3): 600 mg via INTRAVENOUS
  Filled 2015-08-19 (×3): qty 50

## 2015-08-19 MED ORDER — VERAPAMIL HCL ER 240 MG PO TBCR
240.0000 mg | EXTENDED_RELEASE_TABLET | Freq: Every day | ORAL | Status: DC
Start: 2015-08-19 — End: 2015-08-21
  Administered 2015-08-19 – 2015-08-20 (×2): 240 mg via ORAL
  Filled 2015-08-19 (×2): qty 1

## 2015-08-19 MED ORDER — OXYCODONE HCL 5 MG PO TABS
5.0000 mg | ORAL_TABLET | ORAL | Status: DC | PRN
  Administered 2015-08-19 (×2): 15 mg via ORAL
  Administered 2015-08-20 – 2015-08-21 (×3): 10 mg via ORAL
  Filled 2015-08-19: qty 2
  Filled 2015-08-19: qty 3
  Filled 2015-08-19 (×2): qty 2
  Filled 2015-08-19: qty 3
  Filled 2015-08-19: qty 1

## 2015-08-19 MED ORDER — PROPOFOL 10 MG/ML IV BOLUS
INTRAVENOUS | Status: DC | PRN
  Administered 2015-08-19: 50 mg via INTRAVENOUS
  Administered 2015-08-19: 150 mg via INTRAVENOUS

## 2015-08-19 MED ORDER — KETOROLAC TROMETHAMINE 30 MG/ML IJ SOLN
30.0000 mg | Freq: Four times a day (QID) | INTRAMUSCULAR | Status: AC | PRN
Start: 2015-08-19 — End: 2015-08-21
  Administered 2015-08-19: 30 mg via INTRAVENOUS

## 2015-08-19 MED ORDER — LABETALOL HCL 5 MG/ML IV SOLN
INTRAVENOUS | Status: AC
  Filled 2015-08-19: qty 4

## 2015-08-19 MED ORDER — MORPHINE SULFATE (PF) 2 MG/ML IV SOLN: 1.0000 mg | INTRAVENOUS | Status: DC | PRN

## 2015-08-19 MED ORDER — LIDOCAINE HCL (CARDIAC) 20 MG/ML IV SOLN
INTRAVENOUS | Status: DC | PRN
  Administered 2015-08-19: 70 mg via INTRAVENOUS

## 2015-08-19 MED ORDER — ONDANSETRON HCL 4 MG PO TABS: 4.0000 mg | ORAL_TABLET | Freq: Four times a day (QID) | ORAL | Status: DC | PRN

## 2015-08-19 MED ORDER — SODIUM CHLORIDE 0.9 % IV SOLN: INTRAVENOUS | Status: DC

## 2015-08-19 MED ORDER — SUGAMMADEX SODIUM 200 MG/2ML IV SOLN
INTRAVENOUS | Status: DC | PRN
  Administered 2015-08-19: 154.2 mg via INTRAVENOUS

## 2015-08-19 MED ORDER — ONDANSETRON HCL 4 MG PO TABS: 4.0000 mg | ORAL_TABLET | Freq: Three times a day (TID) | ORAL | 0 refills | Status: AC | PRN

## 2015-08-19 MED ORDER — FENTANYL CITRATE (PF) 100 MCG/2ML IJ SOLN
INTRAMUSCULAR | Status: DC | PRN
Start: 2015-08-19 — End: 2015-08-19
  Administered 2015-08-19 (×4): 50 ug via INTRAVENOUS

## 2015-08-19 MED ORDER — METOCLOPRAMIDE HCL 5 MG/ML IJ SOLN: 5.0000 mg | Freq: Three times a day (TID) | INTRAMUSCULAR | Status: DC | PRN

## 2015-08-19 MED ORDER — METHOCARBAMOL 1000 MG/10ML IJ SOLN: 500.0000 mg | Freq: Four times a day (QID) | INTRAVENOUS | Status: DC | PRN

## 2015-08-19 MED ORDER — MAGNESIUM CITRATE PO SOLN: 1.0000 | Freq: Once | ORAL | Status: DC | PRN

## 2015-08-19 MED ORDER — CITALOPRAM HYDROBROMIDE 20 MG PO TABS
20.0000 mg | ORAL_TABLET | Freq: Every day | ORAL | Status: DC
Start: 2015-08-19 — End: 2015-08-21
  Administered 2015-08-19 – 2015-08-20 (×2): 20 mg via ORAL
  Filled 2015-08-19 (×2): qty 1

## 2015-08-19 MED ORDER — SORBITOL 70 % SOLN: 30.0000 mL | Freq: Every day | Status: DC | PRN

## 2015-08-19 MED ORDER — HYDROMORPHONE HCL 1 MG/ML IJ SOLN: 0.2500 mg | INTRAMUSCULAR | Status: DC | PRN

## 2015-08-19 MED ORDER — HYDROMORPHONE HCL 1 MG/ML IJ SOLN
INTRAMUSCULAR | Status: AC
  Administered 2015-08-19: 0.5 mg via INTRAVENOUS
  Filled 2015-08-19: qty 1

## 2015-08-19 MED ORDER — ATORVASTATIN CALCIUM 20 MG PO TABS
20.0000 mg | ORAL_TABLET | Freq: Every day | ORAL | Status: DC
  Administered 2015-08-19 – 2015-08-20 (×2): 20 mg via ORAL
  Filled 2015-08-19 (×2): qty 1

## 2015-08-19 MED ORDER — BUPIVACAINE HCL (PF) 0.25 % IJ SOLN
INTRAMUSCULAR | Status: AC
  Filled 2015-08-19: qty 30

## 2015-08-19 MED ORDER — FUROSEMIDE 20 MG PO TABS
20.0000 mg | ORAL_TABLET | Freq: Every day | ORAL | Status: DC
  Administered 2015-08-20 – 2015-08-21 (×2): 20 mg via ORAL
  Filled 2015-08-19 (×2): qty 1

## 2015-08-19 MED ORDER — ONDANSETRON HCL 4 MG/2ML IJ SOLN
INTRAMUSCULAR | Status: DC | PRN
  Administered 2015-08-19: 4 mg via INTRAVENOUS

## 2015-08-19 MED ORDER — DIPHENHYDRAMINE HCL 12.5 MG/5ML PO ELIX: 25.0000 mg | ORAL_SOLUTION | ORAL | Status: DC | PRN

## 2015-08-19 MED ORDER — OXYCODONE HCL 5 MG PO TABS
ORAL_TABLET | ORAL | Status: AC
  Administered 2015-08-19: 15 mg via ORAL
  Filled 2015-08-19: qty 3

## 2015-08-19 MED ORDER — RIVAROXABAN 20 MG PO TABS
20.0000 mg | ORAL_TABLET | Freq: Every day | ORAL | Status: DC
  Administered 2015-08-20: 20 mg via ORAL
  Filled 2015-08-19: qty 1

## 2015-08-19 MED ORDER — MIDAZOLAM HCL 5 MG/5ML IJ SOLN: INTRAMUSCULAR | Status: DC | PRN

## 2015-08-19 MED ORDER — COLCHICINE 0.6 MG PO TABS: 0.6000 mg | ORAL_TABLET | Freq: Two times a day (BID) | ORAL | Status: DC | PRN

## 2015-08-19 MED ORDER — LABETALOL HCL 5 MG/ML IV SOLN
INTRAVENOUS | Status: DC | PRN
  Administered 2015-08-19 (×2): 5 mg via INTRAVENOUS

## 2015-08-19 MED ORDER — ACETAMINOPHEN 650 MG RE SUPP: 650.0000 mg | Freq: Four times a day (QID) | RECTAL | Status: DC | PRN

## 2015-08-19 MED ORDER — HYDROMORPHONE HCL 1 MG/ML IJ SOLN
INTRAMUSCULAR | Status: AC
  Filled 2015-08-19: qty 1

## 2015-08-19 MED ORDER — CLINDAMYCIN PHOSPHATE 900 MG/50ML IV SOLN: 900.0000 mg | INTRAVENOUS | Status: DC

## 2015-08-19 MED ORDER — LACTATED RINGERS IV SOLN
INTRAVENOUS | Status: DC
  Administered 2015-08-19 (×2): via INTRAVENOUS

## 2015-08-19 MED ORDER — RIVAROXABAN 20 MG PO TABS: 20.0000 mg | ORAL_TABLET | Freq: Every day | ORAL | Status: DC

## 2015-08-19 MED ORDER — POLYETHYLENE GLYCOL 3350 17 G PO PACK: 17.0000 g | PACK | Freq: Every day | ORAL | Status: DC | PRN

## 2015-08-19 MED ORDER — METHOCARBAMOL 500 MG PO TABS
500.0000 mg | ORAL_TABLET | Freq: Four times a day (QID) | ORAL | Status: DC | PRN
  Administered 2015-08-19: 500 mg via ORAL
  Filled 2015-08-19: qty 1

## 2015-08-19 MED ORDER — ROCURONIUM BROMIDE 100 MG/10ML IV SOLN
INTRAVENOUS | Status: DC | PRN
  Administered 2015-08-19: 50 mg via INTRAVENOUS

## 2015-08-19 MED ORDER — ONDANSETRON HCL 4 MG/2ML IJ SOLN: 4.0000 mg | Freq: Four times a day (QID) | INTRAMUSCULAR | Status: DC | PRN

## 2015-08-19 MED ORDER — 0.9 % SODIUM CHLORIDE (POUR BTL) OPTIME
TOPICAL | Status: DC | PRN
  Administered 2015-08-19: 1000 mL

## 2015-08-19 MED ORDER — HYDROMORPHONE HCL 1 MG/ML IJ SOLN
0.2500 mg | INTRAMUSCULAR | Status: DC | PRN
  Administered 2015-08-19 (×4): 0.5 mg via INTRAVENOUS

## 2015-08-19 MED ORDER — SENNOSIDES-DOCUSATE SODIUM 8.6-50 MG PO TABS: 1.0000 | ORAL_TABLET | Freq: Every evening | ORAL | 1 refills | Status: AC | PRN

## 2015-08-19 MED ORDER — METOCLOPRAMIDE HCL 5 MG PO TABS: 5.0000 mg | ORAL_TABLET | Freq: Three times a day (TID) | ORAL | Status: DC | PRN

## 2015-08-19 MED ORDER — METHOCARBAMOL 750 MG PO TABS: 750.0000 mg | ORAL_TABLET | Freq: Two times a day (BID) | ORAL | 0 refills | Status: AC | PRN

## 2015-08-19 SURGICAL SUPPLY — 64 items
BANDAGE ELASTIC 4 VELCRO ST LF (GAUZE/BANDAGES/DRESSINGS) IMPLANT
BANDAGE ELASTIC 6 VELCRO ST LF (GAUZE/BANDAGES/DRESSINGS) IMPLANT
BANDAGE ESMARK 6X9 LF (GAUZE/BANDAGES/DRESSINGS) IMPLANT
BLADE SURG 15 STRL LF DISP TIS (BLADE) ×1 IMPLANT
BLADE SURG 15 STRL SS (BLADE) ×3
BNDG CMPR 9X6 STRL LF SNTH (GAUZE/BANDAGES/DRESSINGS) ×1
BNDG COHESIVE 4X5 TAN STRL (GAUZE/BANDAGES/DRESSINGS) ×3 IMPLANT
BNDG COHESIVE 6X5 TAN STRL LF (GAUZE/BANDAGES/DRESSINGS) IMPLANT
BNDG ESMARK 6X9 LF (GAUZE/BANDAGES/DRESSINGS) ×3
CANISTER SUCT 3000ML PPV (MISCELLANEOUS) ×3 IMPLANT
COVER SURGICAL LIGHT HANDLE (MISCELLANEOUS) ×3 IMPLANT
CUFF TOURNIQUET SINGLE 34IN LL (TOURNIQUET CUFF) ×2 IMPLANT
CUFF TOURNIQUET SINGLE 44IN (TOURNIQUET CUFF) IMPLANT
DRAPE C-ARM 42X72 X-RAY (DRAPES) ×3 IMPLANT
DRAPE C-ARMOR (DRAPES) ×3 IMPLANT
DRAPE IMP U-DRAPE 54X76 (DRAPES) ×3 IMPLANT
DRAPE INCISE IOBAN 66X45 STRL (DRAPES) IMPLANT
DRAPE U-SHAPE 47X51 STRL (DRAPES) ×3 IMPLANT
DRILL 2.6X122MM WL AO SHAFT (BIT) ×2 IMPLANT
DURAPREP 26ML APPLICATOR (WOUND CARE) ×3 IMPLANT
ELECT CAUTERY BLADE 6.4 (BLADE) ×3 IMPLANT
ELECT REM PT RETURN 9FT ADLT (ELECTROSURGICAL) ×3
ELECTRODE REM PT RTRN 9FT ADLT (ELECTROSURGICAL) ×1 IMPLANT
FACESHIELD WRAPAROUND (MASK) ×3 IMPLANT
FACESHIELD WRAPAROUND OR TEAM (MASK) ×1 IMPLANT
GAUZE SPONGE 4X4 12PLY STRL (GAUZE/BANDAGES/DRESSINGS) ×3 IMPLANT
GAUZE XEROFORM 5X9 LF (GAUZE/BANDAGES/DRESSINGS) ×3 IMPLANT
GLOVE SKINSENSE NS SZ7.5 (GLOVE) ×2
GLOVE SKINSENSE STRL SZ7.5 (GLOVE) ×1 IMPLANT
GLOVE SURG SYN 7.5  E (GLOVE) ×4
GLOVE SURG SYN 7.5 E (GLOVE) ×2 IMPLANT
GLOVE SURG SYN 7.5 PF PI (GLOVE) ×2 IMPLANT
GOWN STRL REIN XL XLG (GOWN DISPOSABLE) ×3 IMPLANT
K-WIRE ORTHOPEDIC 1.4X150L (WIRE) ×6
KIT BASIN OR (CUSTOM PROCEDURE TRAY) ×3 IMPLANT
KIT ROOM TURNOVER OR (KITS) ×3 IMPLANT
KWIRE ORTHOPEDIC 1.4X150L (WIRE) IMPLANT
NDL HYPO 25GX1X1/2 BEV (NEEDLE) IMPLANT
NEEDLE HYPO 25GX1X1/2 BEV (NEEDLE) IMPLANT
NS IRRIG 1000ML POUR BTL (IV SOLUTION) ×3 IMPLANT
PACK ORTHO EXTREMITY (CUSTOM PROCEDURE TRAY) ×3 IMPLANT
PAD ARMBOARD 7.5X6 YLW CONV (MISCELLANEOUS) ×6 IMPLANT
PAD CAST 3X4 CTTN HI CHSV (CAST SUPPLIES) ×2 IMPLANT
PADDING CAST COTTON 3X4 STRL (CAST SUPPLIES) ×6
PADDING CAST COTTON 6X4 STRL (CAST SUPPLIES) ×3 IMPLANT
PLATE FIBULA 5H (Plate) ×2 IMPLANT
SCREW BONE 3.5X16MM (Screw) ×4 IMPLANT
SCREW BONE 3.5X40 (Screw) ×2 IMPLANT
SCREW BONE 36MMX3.5MM (Screw) ×2 IMPLANT
SCREW BONE ANKLE 3.5X12MM (Screw) ×6 IMPLANT
SCREW BONE NON-LCKING 3.5X12MM (Screw) ×2 IMPLANT
SCREW LOCKING 3.5X16MM (Screw) ×4 IMPLANT
SPONGE LAP 18X18 X RAY DECT (DISPOSABLE) IMPLANT
SUCTION FRAZIER HANDLE 10FR (MISCELLANEOUS) ×2
SUCTION TUBE FRAZIER 10FR DISP (MISCELLANEOUS) ×1 IMPLANT
SUT ETHILON 3 0 PS 1 (SUTURE) IMPLANT
SUT VIC AB 2-0 CT1 27 (SUTURE)
SUT VIC AB 2-0 CT1 TAPERPNT 27 (SUTURE) IMPLANT
SYR CONTROL 10ML LL (SYRINGE) IMPLANT
TOWEL OR 17X24 6PK STRL BLUE (TOWEL DISPOSABLE) ×3 IMPLANT
TOWEL OR 17X26 10 PK STRL BLUE (TOWEL DISPOSABLE) ×6 IMPLANT
TUBE CONNECTING 12'X1/4 (SUCTIONS) ×1
TUBE CONNECTING 12X1/4 (SUCTIONS) ×2 IMPLANT
WATER STERILE IRR 1000ML POUR (IV SOLUTION) ×3 IMPLANT

## 2015-08-19 NOTE — Op Note (Signed)
   Date of Surgery: 08/19/2015  INDICATIONS: Cristina [name] is a 75 y.o.-year-old female who sustained a left ankle fracture; she was indicated for open reduction and internal fixation due to the displaced nature of the articular fracture and came to the operating room today for this procedure. The patient did consent to the procedure after discussion of the risks and benefits.  PREOPERATIVE DIAGNOSIS: left lateral ankle fracture with syndesmosis injury  POSTOPERATIVE DIAGNOSIS: Same.  PROCEDURE: Open treatment of left ankle fracture with internal fixation. Lateral malleolar CPT [id number] Open treatment of left ankle syndesmosis with internal fixation.  SURGEON: N. Eduard Roux, M.D.  ASSIST: April Green, RNFA.  ANESTHESIA:  general  TOURNIQUET TIME: less than 60 minutes  IV FLUIDS AND URINE: See anesthesia.  ESTIMATED BLOOD LOSS: minimal mL.  IMPLANTS: [employer] Variax 5 hole distal fibula plate  COMPLICATIONS: None.  DESCRIPTION OF PROCEDURE: The patient was brought to the operating room and placed supine on the operating table.  The patient had been signed prior to the procedure and this was documented. The patient had the anesthesia placed by the anesthesiologist.  A nonsterile tourniquet was placed on the upper thigh.  The prep verification and incision time-outs were performed to confirm that this was the correct patient, site, side and location. The patient had an SCD on the opposite lower extremity. The patient did receive antibiotics prior to the incision and was re-dosed during the procedure as needed at indicated intervals.  The patient had the lower extremity prepped and draped in the standard surgical fashion.  The extremity was exsanguinated using an esmarch bandage and the tourniquet was inflated to 300 mm Hg.  A lateral incision over the distal fibula was created. Full-thickness flaps were elevated. Organized hematoma was removed. The fracture was exposed. The bone quality was  extremely poor. The fracture was reduced and held with a clamp gently. We attempted to place a lag screw but the bone was of such poor quality that this was not able to accommodate a lag screw. We then made decision to fix the fracture in a bridge fashion. He appropriately sized plate was placed on the lateral aspect of the fibula. We placed nonlocking screws proximal and distal to contour the plate to the bone. We then placed locking screws through the plate due to the poor bone quality. Once we had screws above and below the fracture I then stressed the syndesmosis and the medial clear space didn't widen. We then used a King tong clamp in order to reduce the syndesmosis and we placed 2 parallel tetra cortical syndesmotic screws using fluoroscopic guidance. Final x-rays were taken. The wound was thoroughly irrigated. Wound was closed in layer fashion using 2-0 Vicryl and 3-0 nylon. Sterile dressings were applied. Foot was immobilized in a short leg splint. Tolerated procedure well and no immediate competitions.  POSTOPERATIVE PLAN: [name] will remain nonweightbearing on this leg for approximately 6 weeks; [name] will return for suture removal in 2 weeks.  He will be immobilized in a short leg splint and then transitioned to a CAM walker at his first follow up appointment.  [name] will receive DVT prophylaxis based on other medications, activity level, and risk ratio of bleeding to thrombosis.  Azucena Cecil, MD [place] [phone] 1:32 PM

## 2015-08-19 NOTE — Anesthesia Preprocedure Evaluation (Addendum)
Anesthesia Evaluation  Patient identified by MRN, date of birth, ID band Patient awake    Reviewed: Allergy & Precautions, NPO status , Patient's Chart, lab work & pertinent test results  Airway Mallampati: II  TM Distance: >3 FB Neck ROM: Full    Dental   Pulmonary asthma ,    breath sounds clear to auscultation       Cardiovascular hypertension, +CHF  + dysrhythmias  Rhythm:Irregular Rate:Normal     Neuro/Psych    GI/Hepatic negative GI ROS, Neg liver ROS,   Endo/Other  negative endocrine ROS  Renal/GU negative Renal ROS     Musculoskeletal   Abdominal   Peds  Hematology   Anesthesia Other Findings   Reproductive/Obstetrics                            Anesthesia Physical Anesthesia Plan  ASA: III  Anesthesia Plan: General   Post-op Pain Management:    Induction: Intravenous  Airway Management Planned: Oral ETT  Additional Equipment:   Intra-op Plan:   Post-operative Plan: Possible Post-op intubation/ventilation  Informed Consent:   Dental advisory given  Plan Discussed with: CRNA and Anesthesiologist  Anesthesia Plan Comments:         Anesthesia Quick Evaluation

## 2015-08-19 NOTE — Anesthesia Procedure Notes (Addendum)
Procedure Name: Intubation Date/Time: 08/19/2015 12:05 PM Performed by: Scheryl Darter Pre-anesthesia Checklist: Patient identified, Emergency Drugs available, Suction available and Patient being monitored Patient Re-evaluated:Patient Re-evaluated prior to inductionOxygen Delivery Method: Circle System Utilized Preoxygenation: Pre-oxygenation with 100% oxygen Intubation Type: IV induction Ventilation: Mask ventilation without difficulty Laryngoscope Size: Miller and 2 Grade View: Grade I Tube type: Oral Tube size: 7.5 mm Number of attempts: 1 Airway Equipment and Method: Stylet and Oral airway Placement Confirmation: ETT inserted through vocal cords under direct vision,  positive ETCO2 and breath sounds checked- equal and bilateral Secured at: 21 cm Tube secured with: Tape Dental Injury: Teeth and Oropharynx as per pre-operative assessment  Comments: Right side bottom loose tooth remains present and unchanged post intubation

## 2015-08-19 NOTE — Transfer of Care (Signed)
Immediate Anesthesia Transfer of Care Note  Patient: Cristina [name]  Procedure(s) Performed: Procedure(s): OPEN REDUCTION INTERNAL FIXATION (ORIF) LEFT ANKLE FRACTURE, POSSIBLE SYNDESMOSIS (Left)  Patient Location: PACU  Anesthesia Type:General  Level of Consciousness: awake, alert , oriented and sedated  Airway & Oxygen Therapy: Patient Spontanous Breathing and Patient connected to nasal cannula oxygen  Post-op Assessment: Report given to RN, Post -op Vital signs reviewed and stable and Patient moving all extremities  Post vital signs: Reviewed and stable  Last Vitals:  Vitals:   08/19/15 0952 08/19/15 1350  BP: (!) 172/106 (!) 160/105  Pulse:  (!) 110  Resp: 16 (!) 22  Temp: 36.8 C     Last Pain: There were no vitals filed for this visit.       Complications: No apparent anesthesia complications

## 2015-08-19 NOTE — H&P (Signed)
PREOPERATIVE H&P  Chief Complaint: left ankle fracture  HPI: Cristina [name] is a 75 y.o. female who presents for surgical treatment of left ankle fracture.  She denies any changes in medical history.  Past Medical History:  Diagnosis Date  . Anxiety   . Asthma   . Atrial fibrillation ([place])   . Dysrhythmia    PAF  . Hyperlipemia   . Hypertension    Past Surgical History:  Procedure Laterality Date  . ANKLE FRACTURE SURGERY Right   . BACK SURGERY     Social History   Social History  . Marital status: Divorced    Spouse name: N/A  . Number of children: N/A  . Years of education: N/A   Occupational History  . Retired    Social History Main Topics  . Smoking status: Never Smoker  . Smokeless tobacco: Never Used  . Alcohol use 0.6 oz/week    1 Glasses of wine per week     Comment: occ  . Drug use: No  . Sexual activity: Not Asked   Other Topics Concern  . None   Social History Narrative   Lives in [place] since 1982   Divorced   G6P4   daughters live close   Retired from [employer] smoker/dirnks occ wine         Family History  Problem Relation Age of Onset  . CAD  73    father-mulitple  . Emphysema      mother  . Diabetes Mellitus II      father   Allergies  Allergen Reactions  . Augmentin [Amoxicillin-Pot Clavulanate] Anaphylaxis and Other (See Comments)    Has patient had a PCN reaction causing immediate rash, facial/tongue/throat swelling, SOB or lightheadedness with hypotension: Yes Has patient had a PCN reaction causing severe rash involving mucus membranes or skin necrosis: No Has patient had a PCN reaction that required hospitalization No Has patient had a PCN reaction occurring within the last 10 years: Yes If all of the above answers are "NO", then may proceed with Cephalosporin use.  [name] Kitchen Lisinopril Anaphylaxis and Cough   Prior to Admission medications   Medication Sig Start Date End Date Taking? Authorizing Provider    atorvastatin (LIPITOR) 20 MG tablet Take 20 mg by mouth at bedtime.    Yes Historical Provider, MD  citalopram (CELEXA) 20 MG tablet Take 20 mg by mouth at bedtime.   Yes Historical Provider, MD  colchicine 0.6 MG tablet Take 0.6 mg by mouth 2 (two) times daily as needed (for gout flares).    Yes Jacolyn Reedy, MD  metoprolol succinate (TOPROL-XL) 100 MG 24 hr tablet Take 100 mg by mouth at bedtime. Take with or immediately following a meal.   Yes Historical Provider, MD  oxyCODONE (ROXICODONE) 5 MG immediate release tablet Take 1 tablet (5 mg total) by mouth every 6 (six) hours as needed for severe pain. 08/12/15  Yes Shary Decamp, PA-C  pramipexole (MIRAPEX) 1 MG tablet Take 2 mg by mouth at bedtime.    Yes Historical Provider, MD  rivaroxaban (XARELTO) 20 MG TABS tablet Take 1 tablet (20 mg total) by mouth daily with supper. 03/06/15  Yes Lelon Perla, MD  verapamil (CALAN-SR) 240 MG CR tablet Take 240 mg by mouth at bedtime.   Yes Historical Provider, MD  Vitamin D, Ergocalciferol, (DRISDOL) 50000 units CAPS capsule Take 50,000 Units by mouth every 7 (seven) days. Pt takes on Tuesday.  Yes Historical Provider, MD  furosemide (LASIX) 20 MG tablet Take 1 tablet (20 mg total) by mouth daily. 03/06/15   Lelon Perla, MD  naproxen sodium (ANAPROX) 220 MG tablet Take 440 mg by mouth 2 (two) times daily as needed (for pain).     Historical Provider, MD     Positive ROS: All other systems have been reviewed and were otherwise negative with the exception of those mentioned in the HPI and as above.  Physical Exam: General: Alert, no acute distress Cardiovascular: No pedal edema Respiratory: No cyanosis, no use of accessory musculature GI: abdomen soft Skin: No lesions in the area of chief complaint Neurologic: Sensation intact distally Psychiatric: Patient is competent for consent with normal mood and affect Lymphatic: no lymphedema  MUSCULOSKELETAL: exam stable  Assessment: left  ankle fracture  Plan: Plan for Procedure(s): OPEN REDUCTION INTERNAL FIXATION (ORIF) LEFT ANKLE FRACTURE, POSSIBLE SYNDESMOSIS  The risks benefits and alternatives were discussed with the patient including but not limited to the risks of nonoperative treatment, versus surgical intervention including infection, bleeding, nerve injury,  blood clots, cardiopulmonary complications, morbidity, mortality, among others, and they were willing to proceed.   Marianna Payment, MD   08/19/2015 11:15 AM

## 2015-08-20 ENCOUNTER — Encounter (HOSPITAL_COMMUNITY): Payer: Self-pay | Admitting: Orthopaedic Surgery

## 2015-08-20 DIAGNOSIS — S82892A Other fracture of left lower leg, initial encounter for closed fracture: Secondary | ICD-10-CM | POA: Diagnosis not present

## 2015-08-20 MED ORDER — SODIUM CHLORIDE 0.9% FLUSH: 10.0000 mL | INTRAVENOUS | Status: DC | PRN

## 2015-08-20 NOTE — Progress Notes (Signed)
   Subjective:  Patient reports pain as mild.    Objective:   VITALS:   Vitals:   08/19/15 1524 08/19/15 2000 08/20/15 0039 08/20/15 0553  BP: (!) 182/109 120/66 110/69 124/69  Pulse: (!) 117 (!) 114 92 91  Resp: 16 16 16 16   Temp: 98.4 F (36.9 C) 98.2 F (36.8 C) 97.8 F (36.6 C) 97.7 F (36.5 C)  TempSrc:  Oral Oral Oral  SpO2: 99% 96% 98% 99%  Weight:      Height:        Neurologically intact Neurovascular intact Sensation intact distally Intact pulses distally Incision: dressing C/D/I and no drainage No cellulitis present Compartment soft   Lab Results  Component Value Date   WBC 12.0 (H) 08/19/2015   HGB 14.7 08/19/2015   HCT 44.7 08/19/2015   MCV 89.2 08/19/2015   PLT 310 08/19/2015     Assessment/Plan:  1 Day Post-Op   - Expected postop acute blood loss anemia - will monitor for symptoms - Up with PT/OT - DVT ppx - SCDs, ambulation, xarelto - NWB operative extremity - Pain control - Discharge planning - home vs SNF  Marianna Payment 08/20/2015, 7:44 AM [phone]

## 2015-08-20 NOTE — Evaluation (Signed)
Physical Therapy Evaluation Patient Details Name: Cristina [name] MRN: [id number] DOB: 10-19-1940 Today's Date: 08/20/2015   History of Present Illness  Pt is a 75 y/o female with LORIF on ankle. Pt with history of anxiety, asthma, R ankle fracture and repair, back surgery.  Clinical Impression  Patient is s/p fall resulting int the above surgery. Pt able to perform stand-pivot transfer from bed to chair with max assist. Unable to safely progress to ambulation. Based upon the patient's current mobility, recommending SNF for further rehabilitation upon D/C . Pt currently in agreement.     Follow Up Recommendations SNF;Supervision for mobility/OOB    Equipment Recommendations  Other (comment) (to be addressed at next venue. )    Recommendations for Other Services       Precautions / Restrictions Precautions Precautions: Fall Restrictions Weight Bearing Restrictions: Yes LLE Weight Bearing: Non weight bearing      Mobility  Bed Mobility Overal bed mobility: Needs Assistance Bed Mobility: Supine to Sit     Supine to sit: Min guard     General bed mobility comments: min guard for safety   Transfers Overall transfer level: Needs assistance Equipment used: Rolling walker (2 wheeled) Transfers: Sit to/from Stand Sit to Stand: Max assist Stand pivot transfers: Max assist       General transfer comment: Max assist to come to standing, with cues, pt able to pivot on Rt LE while using rw to pivot to chair. Physical and verbal assistance needed throughout.   Ambulation/Gait                Stairs            Wheelchair Mobility    Modified Rankin (Stroke Patients Only)       Balance Overall balance assessment: Needs assistance Sitting-balance support: No upper extremity supported Sitting balance-Leahy Scale: Fair     Standing balance support: Bilateral upper extremity supported Standing balance-Leahy Scale: Poor Standing balance comment: using rw and  physical support                             Pertinent Vitals/Pain Pain Assessment: 0-10 Pain Score: 3  Pain Location: Lt ankle Pain Descriptors / Indicators: Grimacing;Aching Pain Intervention(s): Limited activity within patient's tolerance;Monitored during session;Ice applied    Home Living Family/patient expects to be discharged to:: Skilled nursing facility Living Arrangements: Children Available Help at Discharge: Family;Available PRN/intermittently Type of Home: House Home Access: Stairs to enter Entrance Stairs-Rails: None Entrance Stairs-Number of Steps: 1 Home Layout: Able to live on main level with bedroom/bathroom Home Equipment: Walker - standard Additional Comments: lives with daughter.    Prior Function Level of Independence: Needs assistance   Gait / Transfers Assistance Needed: assistance to rise from chair, using walker to ambulate around the house           Hand Dominance        Extremity/Trunk Assessment   Upper Extremity Assessment: Defer to OT evaluation           Lower Extremity Assessment: Generalized weakness   LLE Deficits / Details: able to move without assistance for bed mobility.      Communication   Communication: No difficulties  Cognition Arousal/Alertness: Awake/alert Behavior During Therapy: Anxious Overall Cognitive Status: Within Functional Limits for tasks assessed                      General Comments  Exercises        Assessment/Plan    PT Assessment Patient needs continued PT services  PT Diagnosis Difficulty walking   PT Problem List Decreased strength;Decreased activity tolerance;Decreased balance;Decreased mobility;Decreased safety awareness  PT Treatment Interventions DME instruction;Gait training;Functional mobility training;Therapeutic activities;Therapeutic exercise;Patient/family education   PT Goals (Current goals can be found in the Care Plan section) Acute Rehab PT  Goals Patient Stated Goal: move better, be more active PT Goal Formulation: With patient Time For Goal Achievement: 09/03/15 Potential to Achieve Goals: Good    Frequency Min 3X/week   Barriers to discharge        Co-evaluation               End of Session Equipment Utilized During Treatment: Gait belt Activity Tolerance: Patient tolerated treatment well Patient left: in chair;with call bell/phone within reach (LE elevated ) Nurse Communication: Mobility status;Weight bearing status    Functional Assessment Tool Used: clinical judgment Functional Limitation: Mobility: Walking and moving around Mobility: Walking and Moving Around Current Status [phone]): At least 60 percent but less than 80 percent impaired, limited or restricted Mobility: Walking and Moving Around Goal Status [phone]): At least 40 percent but less than 60 percent impaired, limited or restricted    Time: [id number] PT Time Calculation (min) (ACUTE ONLY): 23 min   Charges:   PT Evaluation $PT Eval Moderate Complexity: 1 Procedure PT Treatments $Therapeutic Activity: 8-22 mins   PT G Codes:   PT G-Codes **NOT FOR INPATIENT CLASS** Functional Assessment Tool Used: clinical judgment Functional Limitation: Mobility: Walking and moving around Mobility: Walking and Moving Around Current Status [id number]): At least 60 percent but less than 80 percent impaired, limited or restricted Mobility: Walking and Moving Around Goal Status [phone]): At least 40 percent but less than 60 percent impaired, limited or restricted    Cassell Clement, PT, [place] Pager [phone] Office [phone]  08/20/2015, 3:34 PM

## 2015-08-20 NOTE — Evaluation (Signed)
Occupational Therapy Evaluation Patient Details Name: Cristina [name] MRN: [id number] DOB: 21-May-1940 Today's Date: 08/20/2015    History of Present Illness Pt is a 75 y/o female with LORIF on ankle. Pt with history of anxiety, asthma, R ankle fracture and repair, back surgery.   Clinical Impression   PTA pt with generalized weakness (needed help sit to stand) but independent in ADL and IADL. Very supportive children/family. Pt with OT deficits listed below. Pt able to perform grooming sitting EOB, but failed attempt (x2) to transfer to [facility]. OT educated importance of sitting up for prevention of pneumonia, healthy blood flow, etc to Pt and family. Pt mental alertness may be affected by medication, as she had trouble answering some questions, but otherwise awake, and willing to work with therapy. Pt will benefit from OT in the acute care setting to increase safety and independence during ADL.    Follow Up Recommendations  SNF    Equipment Recommendations  3 in 1 bedside comode;Other (comment) (to be determined by next venue of care)    Recommendations for Other Services       Precautions / Restrictions Precautions Precautions: Fall Restrictions Weight Bearing Restrictions: Yes Other Position/Activity Restrictions: NWB LLE      Mobility Bed Mobility Overal bed mobility: Needs Assistance Bed Mobility: Supine to Sit     Supine to sit: Min guard;HOB elevated (with rails)     General bed mobility comments: Pt moves LLE at min guard level, verbal cues and increased time for bed mobility  Transfers Overall transfer level: Needs assistance Equipment used: Rolling walker (2 wheeled) Transfers: Sit to/from Stand Sit to Stand: Max assist         General transfer comment: max assist for sit to stand. +2 for ambulation for safety and physical assistance.    Balance Overall balance assessment: Needs assistance Sitting-balance support: Single extremity supported;Feet  supported Sitting balance-Leahy Scale: Fair Sitting balance - Comments: Pt more alert and less anxious sitting EOB, using LUE for support to maintain balance   Standing balance support: Bilateral upper extremity supported Standing balance-Leahy Scale: Poor                              ADL Overall ADL's : Needs assistance/impaired     Grooming: Wash/dry hands;Wash/dry face;Applying deodorant;Brushing hair;Set up;Sitting Grooming Details (indicate cue type and reason): sat EOB to perform grooming Upper Body Bathing: Set up;Sitting       Upper Body Dressing : Set up;Sitting   Lower Body Dressing: Maximal assistance;Sit to/from stand   Toilet Transfer: +2 for physical assistance Toilet Transfer Details (indicate cue type and reason): attempted x2 for stand pivot to [facility], Pt able to stand for 15 seconds using RW and mod to max assist from OT         Functional mobility during ADLs: +2 for physical assistance       Vision     Perception     Praxis      Pertinent Vitals/Pain Pain Assessment: 0-10 Pain Score: 5  Pain Location: L ankle and foot Pain Descriptors / Indicators: Grimacing;Guarding;Discomfort Pain Intervention(s): Monitored during session;Repositioned;Ice applied     Hand Dominance Right   Extremity/Trunk Assessment Upper Extremity Assessment Upper Extremity Assessment: Generalized weakness   Lower Extremity Assessment Lower Extremity Assessment: LLE deficits/detail LLE Deficits / Details: NWB due to surgery LLE Coordination:  (moved in and out of bed well min guard)  Communication Communication Communication: No difficulties   Cognition Arousal/Alertness: Awake/alert Behavior During Therapy: Anxious Overall Cognitive Status: Within Functional Limits for tasks assessed                     General Comments       Exercises       Shoulder Instructions      Home Living Family/patient expects to be discharged to::  Private residence Living Arrangements: Children Available Help at Discharge: Family;Available PRN/intermittently (mulitple children who can cover at different times) Type of Home: House Home Access: Stairs to enter [employer] of Steps: 1 Entrance Stairs-Rails: None Home Layout: Two level;1/2 bath on main level;Able to live on main level with bedroom/bathroom Alternate Level Stairs-Number of Steps: 14 Alternate Level Stairs-Rails: Right (both side halfwayup) Bathroom Shower/Tub: Tub/shower unit;Walk-in shower (upstairs) Shower/tub characteristics: Curtain Biochemist, clinical: Handicapped height     Home Equipment: Environmental consultant - standard;Grab bars - toilet;Grab bars - tub/shower   Additional Comments: lives with daughter.      Prior Functioning/Environment Level of Independence: Needs assistance  Gait / Transfers Assistance Needed: assistance to rise from chair ADL's / Homemaking Assistance Needed: none, independent - lived on 2nd floor Communication / Swallowing Assistance Needed: none      OT Diagnosis: Generalized weakness;Acute pain   OT Problem List: Decreased strength;Decreased range of motion;Decreased activity tolerance;Impaired balance (sitting and/or standing);Decreased cognition;Decreased knowledge of use of DME or AE;Decreased knowledge of precautions;Pain;Increased edema (cognition due to medication potentially)   OT Treatment/Interventions: Self-care/ADL training;Energy conservation;DME and/or AE instruction;Therapeutic activities;Balance training;Patient/family education    OT Goals(Current goals can be found in the care plan section) Acute Rehab OT Goals Patient Stated Goal: To go home OT Goal Formulation: With patient/family Time For Goal Achievement: 08/27/15 Potential to Achieve Goals: Good ADL Goals Pt Will Perform Lower Body Bathing: with set-up;sit to/from stand;with adaptive equipment Pt Will Perform Lower Body Dressing: with set-up;sit to/from  stand Pt Will Transfer to Toilet: with min guard assist;ambulating;bedside commode Pt Will Perform Toileting - Clothing Manipulation and hygiene: with supervision;sit to/from stand Pt Will Perform Tub/Shower Transfer: Tub transfer;with min assist;3 in 1;rolling walker;grab bars  OT Frequency: Min 2X/week   Barriers to D/C:    Pt can live on 1st floor with 1/2 bath, but no access to shower - says she will sponge bath. Good family support, but not available 24 hours       Co-evaluation              End of Session Equipment Utilized During Treatment: Gait belt;Rolling walker Nurse Communication: Mobility status  Activity Tolerance: Patient tolerated treatment well Patient left: in bed;with call bell/phone within reach;with family/visitor present   Time: [id number] OT Time Calculation (min): 47 min Charges:  OT General Charges $OT Visit: 1 Procedure OT Evaluation $OT Eval Low Complexity: 1 Procedure OT Treatments $Self Care/Home Management : 23-37 mins G-Codes: OT G-codes **NOT FOR INPATIENT CLASS** Functional Assessment Tool Used: Clinical Judgement Functional Limitation: Self care Self Care Current Status [id number]): At least 40 percent but less than 60 percent impaired, limited or restricted Self Care Goal Status [id number]): At least 1 percent but less than 20 percent impaired, limited or restricted Self Care Discharge Status [phone]): At least 1 percent but less than 20 percent impaired, limited or restricted  Jaci Carrel OTR/L 08/20/2015, 10:34 AM [phone]

## 2015-08-21 DIAGNOSIS — S82892A Other fracture of left lower leg, initial encounter for closed fracture: Secondary | ICD-10-CM | POA: Diagnosis not present

## 2015-08-21 NOTE — NC FL2 (Signed)
[place] LEVEL OF CARE SCREENING TOOL     IDENTIFICATION  Patient Name: Cristina [name] Birthdate: [birth year] Sex: female Admission Date (Current Location): 08/19/2015  [facility] and [place] Number:  Herbalist and Address:  [facility] [place], [place] [address] [place], [place] [address]      Provider Number: [id number]  Attending Physician Name and Address:  Leandrew Koyanagi, MD  Relative Name and Phone Number:       Current Level of Care: Hospital Recommended Level of Care: [place] Prior Approval Number:    Date Approved/Denied:   PASRR Number:   [id number]  Discharge Plan: SNF    Current Diagnoses: Patient Active Problem List   Diagnosis Date Noted  . S/P ORIF (open reduction internal fixation) fracture 08/19/2015  . Anaphylactic reaction 01/23/2014  . Essential hypertension 08/07/2013  . Atrial fibrillation ([place]) 07/28/2013  . Hypertensive heart disease 07/28/2013  . Chronic diastolic congestive heart failure ([place]) 07/28/2013  . Obesity (BMI 30-39.9)   . Gout   . Current use of long term anticoagulation   . Acute diastolic CHF (congestive heart failure) ([place]) 07/24/2013    Orientation RESPIRATION BLADDER Height & Weight     Self, Time, Situation, Place  O2 Continent Weight: 170 lb (77.1 kg) Height:  5\' 4"  (162.6 cm)  BEHAVIORAL SYMPTOMS/MOOD NEUROLOGICAL BOWEL NUTRITION STATUS      Continent Diet (Please see discharge summary.)  AMBULATORY STATUS COMMUNICATION OF NEEDS Skin   Extensive Assist Verbally Surgical wounds                       Personal Care Assistance Level of Assistance  Bathing, Feeding, Dressing Bathing Assistance: Limited assistance Feeding assistance: Limited assistance Dressing Assistance: Limited assistance     Functional Limitations Info             SPECIAL CARE FACTORS FREQUENCY  PT (By licensed PT), OT (By licensed OT)     PT Frequency: 5 OT Frequency: 5             Contractures      Additional Factors Info  Code Status, Allergies Code Status Info: FULL Allergies Info: Augmentin Amoxicillin-pot Clavulanate, Lisinopril           Current Medications (08/21/2015):  This is the current hospital active medication list Current Facility-Administered Medications  Medication Dose Route Frequency Provider Last Rate Last Dose  . 0.9 %  sodium chloride infusion   Intravenous Continuous Naiping Ephriam Jenkins, MD      . acetaminophen (TYLENOL) tablet 650 mg  650 mg Oral Q6H PRN Naiping Ephriam Jenkins, MD       Or  . acetaminophen (TYLENOL) suppository 650 mg  650 mg Rectal Q6H PRN Leandrew Koyanagi, MD      . atorvastatin (LIPITOR) tablet 20 mg  20 mg Oral QHS Leandrew Koyanagi, MD   20 mg at 08/20/15 2102  . citalopram (CELEXA) tablet 20 mg  20 mg Oral QHS Leandrew Koyanagi, MD   20 mg at 08/20/15 2102  . colchicine tablet 0.6 mg  0.6 mg Oral BID PRN Leandrew Koyanagi, MD      . diphenhydrAMINE (BENADRYL) 12.5 MG/5ML elixir 25 mg  25 mg Oral Q4H PRN Leandrew Koyanagi, MD      . furosemide (LASIX) tablet 20 mg  20 mg Oral Daily Naiping Ephriam Jenkins, MD   20 mg at 08/21/15 [id number]  . ketorolac (  TORADOL) 30 MG/ML injection 30 mg  30 mg Intravenous Q6H PRN Leandrew Koyanagi, MD   30 mg at 08/19/15 1406  . lactated ringers infusion   Intravenous Continuous Finis Bud, MD 50 mL/hr at 08/19/15 1042    . magnesium citrate solution 1 Bottle  1 Bottle Oral Once PRN Naiping Ephriam Jenkins, MD      . methocarbamol (ROBAXIN) tablet 500 mg  500 mg Oral Q6H PRN Leandrew Koyanagi, MD   500 mg at 08/19/15 1856   Or  . methocarbamol (ROBAXIN) 500 mg in dextrose 5 % 50 mL IVPB  500 mg Intravenous Q6H PRN Naiping Ephriam Jenkins, MD      . metoCLOPramide (REGLAN) tablet 5-10 mg  5-10 mg Oral Q8H PRN Naiping Ephriam Jenkins, MD       Or  . metoCLOPramide (REGLAN) injection 5-10 mg  5-10 mg Intravenous Q8H PRN Naiping Ephriam Jenkins, MD      . metoprolol succinate (TOPROL-XL) 24 hr tablet 100 mg  100 mg Oral QHS Leandrew Koyanagi, MD   100 mg at 08/20/15 2102  . morphine 2  MG/ML injection 1 mg  1 mg Intravenous Q2H PRN Naiping Ephriam Jenkins, MD      . ondansetron [facility]) tablet 4 mg  4 mg Oral Q6H PRN Naiping Ephriam Jenkins, MD       Or  . ondansetron [facility]) injection 4 mg  4 mg Intravenous Q6H PRN Naiping Ephriam Jenkins, MD      . oxyCODONE (Oxy IR/ROXICODONE) immediate release tablet 5-15 mg  5-15 mg Oral Q3H PRN Leandrew Koyanagi, MD   10 mg at 08/21/15 [id number]  . polyethylene glycol (MIRALAX / GLYCOLAX) packet 17 g  17 g Oral Daily PRN Leandrew Koyanagi, MD      . pramipexole (MIRAPEX) tablet 2 mg  2 mg Oral QHS Naiping Ephriam Jenkins, MD   2 mg at 08/20/15 2103  . rivaroxaban (XARELTO) tablet 20 mg  20 mg Oral Q supper Leandrew Koyanagi, MD   20 mg at 08/20/15 1456  . sodium chloride flush (NS) 0.9 % injection 10-40 mL  10-40 mL Intracatheter PRN Leandrew Koyanagi, MD      . sorbitol 70 % solution 30 mL  30 mL Oral Daily PRN Naiping Ephriam Jenkins, MD      . verapamil (CALAN-SR) CR tablet 240 mg  240 mg Oral QHS Leandrew Koyanagi, MD   240 mg at 08/20/15 2103  . [START ON 08/25/2015] Vitamin D (Ergocalciferol) (DRISDOL) capsule 50,000 Units  50,000 Units Oral Q7 days Naiping Ephriam Jenkins, MD         Discharge Medications: Please see discharge summary for a list of discharge medications.  Relevant Imaging Results:  Relevant Lab Results:   Additional Information SSN: [id number]  Caroline Sauger, LCSW

## 2015-08-21 NOTE — Clinical Social Work Placement (Signed)
   CLINICAL SOCIAL WORK PLACEMENT  NOTE  Date:  08/21/2015  Patient Details  Name: Cristina [name] MRN: [id number] Date of Birth: [birth year]  Clinical Social Work is seeking post-discharge placement for this patient at the [place] level of care (*CSW will initial, date and re-position this form in  chart as items are completed):  Yes   Patient/family provided with [facility] Work Department's list of facilities offering this level of care within the geographic area requested by the patient (or if unable, by the patient's family).  Yes   Patient/family informed of their freedom to choose among providers that offer the needed level of care, that participate in Medicare, Medicaid or managed care program needed by the patient, have an available bed and are willing to accept the patient.  Yes   Patient/family informed of [facility]'s ownership interest in [facility] and [facility], as well as of the fact that they are under no obligation to receive care at these facilities.  PASRR submitted to EDS on 08/21/15     PASRR number received on 08/21/15     Existing PASRR number confirmed on       FL2 transmitted to all facilities in geographic area requested by pt/family on 08/21/15     FL2 transmitted to all facilities within larger geographic area on       Patient informed that his/her managed care company has contracts with or will negotiate with certain facilities, including the following:        Yes   Patient/family informed of bed offers received.  Patient chooses bed at [place], [place]     Physician recommends and patient chooses bed at      Patient to be transferred to [facility] on 08/21/15.  Patient to be transferred to facility by [facility]     Patient family notified on 08/21/15 of transfer.  Name of family member notified:  Patient     PHYSICIAN       Additional Comment:     _______________________________________________ Caroline Sauger, LCSW 08/21/2015, 11:16 AM

## 2015-08-21 NOTE — Clinical Social Work Note (Signed)
Patient to be discharged to [facility]. Patient updated regarding discharge. Patient to be transported via EMS. RN report number: [place], [place] [facility] Social Worker

## 2015-08-21 NOTE — Progress Notes (Signed)
   Subjective:  Patient reports pain as mild.    Objective:   VITALS:   Vitals:   08/20/15 0039 08/20/15 0553 08/20/15 1242 08/21/15 0556  BP: 110/69 124/69 (!) 103/58 107/70  Pulse: 92 91 85 71  Resp: 16 16 18 16   Temp: 97.8 F (36.6 C) 97.7 F (36.5 C) 98.8 F (37.1 C) 97.8 F (36.6 C)  TempSrc: Oral Oral Oral Oral  SpO2: 98% 99% 90% 93%  Weight:      Height:        Neurologically intact Neurovascular intact Sensation intact distally Intact pulses distally Incision: dressing C/D/I and no drainage No cellulitis present Compartment soft   Lab Results  Component Value Date   WBC 12.0 (H) 08/19/2015   HGB 14.7 08/19/2015   HCT 44.7 08/19/2015   MCV 89.2 08/19/2015   PLT 310 08/19/2015     Assessment/Plan:  2 Days Post-Op   - dc to SNF when bed available - stable for dc from ortho stand point  Marianna Payment 08/21/2015, 7:45 AM [phone]

## 2015-08-21 NOTE — Progress Notes (Signed)
Physical Therapy Treatment Patient Details Name: Cristina [name] MRN: [id number] DOB: Jan 24, 1940 Today's Date: 08/21/2015    History of Present Illness Pt is a 75 y/o female with LORIF on ankle. Pt with history of anxiety, asthma, R ankle fracture and repair, back surgery.    PT Comments    Pt making gradual progress with PT to progress mobility. Pt able to take steps with +2 assistance. Based upon the patient's mobility, continue to recommend SNF for further rehabilitation.   Follow Up Recommendations  SNF;Supervision for mobility/OOB     Equipment Recommendations  Other (comment) (to be addressed at next venue)    Recommendations for Other Services       Precautions / Restrictions Precautions Precautions: Fall Restrictions Weight Bearing Restrictions: Yes LLE Weight Bearing: Non weight bearing    Mobility  Bed Mobility Overal bed mobility: Needs Assistance Bed Mobility: Supine to Sit     Supine to sit: Min guard     General bed mobility comments: min guard for safety   Transfers Overall transfer level: Needs assistance Equipment used: Rolling walker (2 wheeled) Transfers: Sit to/from Stand Sit to Stand: Max assist Stand pivot transfers: Max assist       General transfer comment: physical and verbal cues needed throughout. Pt anxious regarding attempts to mobilize.   Ambulation/Gait Ambulation/Gait assistance: +2 physical assistance;Min assist;Max assist (+1 max assist and +1 min assist needed. ) Ambulation Distance (Feet): 3 Feet Assistive device: Rolling walker (2 wheeled) Gait Pattern/deviations: Step-to pattern Gait velocity: very slow pattern   General Gait Details: physical assist needed to unweight and allow Rt LE to advance. Heavy encouragement provided for use of UEs.    Stairs            Wheelchair Mobility    Modified Rankin (Stroke Patients Only)       Balance Overall balance assessment: Needs assistance Sitting-balance  support: No upper extremity supported Sitting balance-Leahy Scale: Fair     Standing balance support: Bilateral upper extremity supported Standing balance-Leahy Scale: Poor Standing balance comment: using rw and physical assist                    Cognition Arousal/Alertness: Awake/alert Behavior During Therapy: Anxious Overall Cognitive Status: Within Functional Limits for tasks assessed                      Exercises      General Comments        Pertinent Vitals/Pain Pain Assessment: Faces Faces Pain Scale: Hurts little more Pain Location: Lt ankle Pain Descriptors / Indicators: Penetrating;Operative site guarding Pain Intervention(s): Limited activity within patient's tolerance;Monitored during session    Home Living                      Prior Function            PT Goals (current goals can now be found in the care plan section) Acute Rehab PT Goals Patient Stated Goal: move better with less pain PT Goal Formulation: With patient Time For Goal Achievement: 09/03/15 Potential to Achieve Goals: Good Progress towards PT goals: Progressing toward goals    Frequency  Min 3X/week    PT Plan Current plan remains appropriate    Co-evaluation   Reason for Co-Treatment: For patient/therapist safety PT goals addressed during session: Mobility/safety with mobility OT goals addressed during session: ADL's and self-care     End of Session Equipment Utilized During Treatment:  Gait belt Activity Tolerance: Patient tolerated treatment well Patient left: in chair;with call bell/phone within reach;with family/visitor present     Time: [id number] PT Time Calculation (min) (ACUTE ONLY): 24 min  Charges:  $Gait Training: 8-22 mins                    G Codes:      Cassell Clement, PT, CSCS Pager [phone] Office [phone]  08/21/2015, 12:54 PM

## 2015-08-21 NOTE — Care Management Obs Status (Signed)
[facility] NOTIFICATION   Patient Details  Name: Cristina [name] MRN: [id number] Date of Birth: 1940-07-26   Medicare Observation Status Notification Given:  Yes    Ninfa Meeker, RN 08/21/2015, 11:39 AM
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
## 2015-08-21 NOTE — Discharge Summary (Signed)
Physician Discharge Summary      Patient ID: Cristina [name] MRN: [id number] DOB/AGE: 1940-07-30 75 y.o.  Admit date: 08/19/2015 Discharge date: 08/21/2015  Admission Diagnoses:  <principal problem not specified>  Discharge Diagnoses:  Active Problems:   S/P ORIF (open reduction internal fixation) fracture   Past Medical History:  Diagnosis Date  . Anxiety   . Asthma   . Atrial fibrillation ([place])   . Dysrhythmia    PAF  . History of gout   . Hyperlipemia   . Hypertension   . Pneumonia ~ 2012    Surgeries: Procedure(s): OPEN REDUCTION INTERNAL FIXATION (ORIF) LEFT ANKLE FRACTURE, POSSIBLE SYNDESMOSIS on 08/19/2015   Consultants (if any):   Discharged Condition: Improved  Hospital Course: Cristina [name] is an 75 y.o. female who was admitted 08/19/2015 with a diagnosis of <principal problem not specified> and went to the operating room on 08/19/2015 and underwent the above named procedures.    She was given perioperative antibiotics:  Anti-infectives    Start     Dose/Rate Route Frequency Ordered Stop   08/19/15 1730  clindamycin (CLEOCIN) IVPB 600 mg     600 mg 100 mL/hr over 30 Minutes Intravenous Every 6 hours 08/19/15 1520 08/20/15 0723   08/19/15 1130  clindamycin (CLEOCIN) IVPB 900 mg     900 mg 100 mL/hr over 30 Minutes Intravenous On call to O.R. 08/19/15 0740 08/19/15 1159   08/19/15 0745  clindamycin (CLEOCIN) IVPB 900 mg  Status:  Discontinued     900 mg 100 mL/hr over 30 Minutes Intravenous On call to O.R. 08/19/15 [id number] 08/19/15 0740    .  She was given sequential compression devices, early ambulation, and xarelto for DVT prophylaxis.  She benefited maximally from the hospital stay and there were no complications.    Recent vital signs:  Vitals:   08/20/15 1242 08/21/15 0556  BP: (!) 103/58 107/70  Pulse: 85 71  Resp: 18 16  Temp: 98.8 F (37.1 C) 97.8 F (36.6 C)    Recent laboratory studies:  Lab Results  Component Value Date   HGB  14.7 08/19/2015   HGB 12.9 08/12/2015   HGB 14.1 01/24/2014   Lab Results  Component Value Date   WBC 12.0 (H) 08/19/2015   PLT 310 08/19/2015   Lab Results  Component Value Date   INR 0.96 07/23/2013   Lab Results  Component Value Date   NA 142 08/19/2015   K 3.8 08/19/2015   CL 102 08/19/2015   CO2 29 08/19/2015   BUN 17 08/19/2015   CREATININE 0.77 08/19/2015   GLUCOSE 117 (H) 08/19/2015    Discharge Medications:     Medication List    TAKE these medications   atorvastatin 20 MG tablet Commonly known as:  LIPITOR Take 20 mg by mouth at bedtime.   citalopram 20 MG tablet Commonly known as:  CELEXA Take 20 mg by mouth at bedtime.   colchicine 0.6 MG tablet Take 0.6 mg by mouth 2 (two) times daily as needed (for gout flares).   furosemide 20 MG tablet Commonly known as:  LASIX Take 1 tablet (20 mg total) by mouth daily.   methocarbamol 750 MG tablet Commonly known as:  ROBAXIN Take 1 tablet (750 mg total) by mouth 2 (two) times daily as needed for muscle spasms.   metoprolol succinate 100 MG 24 hr tablet Commonly known as:  TOPROL-XL Take 100 mg by mouth at bedtime. Take with or immediately following a meal.  naproxen sodium 220 MG tablet Commonly known as:  ANAPROX Take 440 mg by mouth 2 (two) times daily as needed (for pain).   ondansetron 4 MG tablet Commonly known as:  ZOFRAN Take 1-2 tablets (4-8 mg total) by mouth every 8 (eight) hours as needed for nausea or vomiting.   oxyCODONE 5 MG immediate release tablet Commonly known as:  ROXICODONE Take 1 tablet (5 mg total) by mouth every 6 (six) hours as needed for severe pain. What changed:  Another medication with the same name was added. Make sure you understand how and when to take each.   oxyCODONE 10 mg 12 hr tablet Commonly known as:  OXYCONTIN Take 1 tablet (10 mg total) by mouth every 12 (twelve) hours. What changed:  You were already taking a medication with the same name, and this  prescription was added. Make sure you understand how and when to take each.   oxyCODONE 5 MG immediate release tablet Commonly known as:  Oxy IR/ROXICODONE Take 1-3 tablets (5-15 mg total) by mouth every 4 (four) hours as needed. What changed:  You were already taking a medication with the same name, and this prescription was added. Make sure you understand how and when to take each.   pramipexole 1 MG tablet Commonly known as:  MIRAPEX Take 2 mg by mouth at bedtime.   rivaroxaban 20 MG Tabs tablet Commonly known as:  XARELTO Take 1 tablet (20 mg total) by mouth daily with supper.   senna-docusate 8.6-50 MG tablet Commonly known as:  SENOKOT S Take 1 tablet by mouth at bedtime as needed.   verapamil 240 MG CR tablet Commonly known as:  CALAN-SR Take 240 mg by mouth at bedtime.   Vitamin D (Ergocalciferol) 50000 units Caps capsule Commonly known as:  DRISDOL Take 50,000 Units by mouth every 7 (seven) days. Pt takes on Tuesday.       Diagnostic Studies: Dg Ankle Complete Left  Result Date: 08/19/2015 CLINICAL DATA:  ORIF of left fibular fracture EXAM: DG C-ARM 61-120 MIN; LEFT ANKLE COMPLETE - 3+ VIEW COMPARISON:  None. FINDINGS: Fixation sideplate is noted along the distal fibula. Multiple fixation screws are seen. Two of these traverses into the distal fibula. The fracture fragments are in anatomic alignment. No gross soft tissue abnormality is noted. IMPRESSION: Status post ORIF of distal fibular fracture Electronically Signed   By: Inez Catalina M.D.   On: 08/19/2015 13:31   Dg Ankle Complete Left  Result Date: 08/12/2015 CLINICAL DATA:  Fall while walking down steps with pain and swelling, status post reduction EXAM: LEFT ANKLE COMPLETE - 3+ VIEW COMPARISON:  08/12/2015 FINDINGS: The previously seen fracture has been somewhat reduced. There remains some widening of the ankle mortise medially although reduced from the prior exam. No other focal abnormality is noted. IMPRESSION:  Slight reduction in fracture and displacement of the talus with respect to the tibia. Electronically Signed   By: Inez Catalina M.D.   On: 08/12/2015 23:14  Dg Ankle Complete Left  Result Date: 08/12/2015 CLINICAL DATA:  Left ankle pain, status post fall, bruising/swelling EXAM: LEFT ANKLE COMPLETE - 3+ VIEW COMPARISON:  None. FINDINGS: Mildly displaced oblique distal fibular fracture, with approximately 1/2 shaft width lateral displacement of the lateral malleolus. Widening of the medial malleolus. Moderate lateral soft tissue swelling. The base of the fifth metatarsal is unremarkable. IMPRESSION: Mildly displaced oblique distal fibular fracture. Widening of the medial ankle mortise. Moderate lateral soft tissue swelling. Electronically Signed   By:  Julian Hy M.D.   On: 08/12/2015 18:52  Dg C-arm 1-60 Min  Result Date: 08/19/2015 CLINICAL DATA:  ORIF of left fibular fracture EXAM: DG C-ARM 61-120 MIN; LEFT ANKLE COMPLETE - 3+ VIEW COMPARISON:  None. FINDINGS: Fixation sideplate is noted along the distal fibula. Multiple fixation screws are seen. Two of these traverses into the distal fibula. The fracture fragments are in anatomic alignment. No gross soft tissue abnormality is noted. IMPRESSION: Status post ORIF of distal fibular fracture Electronically Signed   By: Inez Catalina M.D.   On: 08/19/2015 13:31    Disposition: 01-Home or Self Care  Discharge Instructions    Call MD / Call 911    Complete by:  As directed   If you experience chest pain or shortness of breath, CALL 911 and be transported to the hospital emergency room.  If you develope a fever above 101.5 F, pus (white drainage) or increased drainage or redness at the wound, or calf pain, call your surgeon's office.   Constipation Prevention    Complete by:  As directed   Drink plenty of fluids.  Prune juice may be helpful.  You may use a stool softener, such as Colace (over the counter) 100 mg twice a day.  Use MiraLax (over the  counter) for constipation as needed.   Diet - low sodium heart healthy    Complete by:  As directed   Diet general    Complete by:  As directed   Driving restrictions    Complete by:  As directed   No driving while taking narcotic pain meds.   Increase activity slowly as tolerated    Complete by:  As directed      Follow-up Information    Marianna Payment, MD Follow up in 2 week(s).   Specialty:  Orthopedic Surgery Why:  For suture removal, For wound re-check Contact information: [address] [place] [phone]            Signed: Marianna Payment 08/21/2015, 1:52 PM

## 2015-08-21 NOTE — Discharge Summary (Signed)
Physician Discharge Summary      Patient ID: Cristina [name] MRN: [id number] DOB/AGE: 76-21-1942 76 y.o.  Admit date: 08/19/2015 Discharge date: 08/21/2015  Admission Diagnoses:  <principal problem not specified>  Discharge Diagnoses:  Active Problems:   S/P ORIF (open reduction internal fixation) fracture   Past Medical History:  Diagnosis Date  . Anxiety   . Asthma   . Atrial fibrillation ([place])   . Dysrhythmia    PAF  . History of gout   . Hyperlipemia   . Hypertension   . Pneumonia ~ 2012    Surgeries: Procedure(s): OPEN REDUCTION INTERNAL FIXATION (ORIF) LEFT ANKLE FRACTURE, POSSIBLE SYNDESMOSIS on 08/19/2015   Consultants (if any):   Discharged Condition: Improved  Hospital Course: Cristina [name] is an 75 y.o. female who was admitted 08/19/2015 with a diagnosis of <principal problem not specified> and went to the operating room on 08/19/2015 and underwent the above named procedures.    She was given perioperative antibiotics:  Anti-infectives    Start     Dose/Rate Route Frequency Ordered Stop   08/19/15 1730  clindamycin (CLEOCIN) IVPB 600 mg     600 mg 100 mL/hr over 30 Minutes Intravenous Every 6 hours 08/19/15 1520 08/20/15 0723   08/19/15 1130  clindamycin (CLEOCIN) IVPB 900 mg     900 mg 100 mL/hr over 30 Minutes Intravenous On call to O.R. 08/19/15 0740 08/19/15 1159   08/19/15 0745  clindamycin (CLEOCIN) IVPB 900 mg  Status:  Discontinued     900 mg 100 mL/hr over 30 Minutes Intravenous On call to O.R. 08/19/15 [id number] 08/19/15 0740    .  She was given sequential compression devices, early ambulation, and xarelto for DVT prophylaxis.  She benefited maximally from the hospital stay and there were no complications.    Recent vital signs:  Vitals:   08/20/15 1242 08/21/15 0556  BP: (!) 103/58 107/70  Pulse: 85 71  Resp: 18 16  Temp: 98.8 F (37.1 C) 97.8 F (36.6 C)    Recent laboratory studies:  Lab Results  Component Value Date   HGB  14.7 08/19/2015   HGB 12.9 08/12/2015   HGB 14.1 01/24/2014   Lab Results  Component Value Date   WBC 12.0 (H) 08/19/2015   PLT 310 08/19/2015   Lab Results  Component Value Date   INR 0.96 07/23/2013   Lab Results  Component Value Date   NA 142 08/19/2015   K 3.8 08/19/2015   CL 102 08/19/2015   CO2 29 08/19/2015   BUN 17 08/19/2015   CREATININE 0.77 08/19/2015   GLUCOSE 117 (H) 08/19/2015    Discharge Medications:    Diagnostic Studies: Dg Ankle Complete Left  Result Date: 08/19/2015 CLINICAL DATA:  ORIF of left fibular fracture EXAM: DG C-ARM 61-120 MIN; LEFT ANKLE COMPLETE - 3+ VIEW COMPARISON:  None. FINDINGS: Fixation sideplate is noted along the distal fibula. Multiple fixation screws are seen. Two of these traverses into the distal fibula. The fracture fragments are in anatomic alignment. No gross soft tissue abnormality is noted. IMPRESSION: Status post ORIF of distal fibular fracture Electronically Signed   By: Inez Catalina M.D.   On: 08/19/2015 13:31   Dg Ankle Complete Left  Result Date: 08/12/2015 CLINICAL DATA:  Fall while walking down steps with pain and swelling, status post reduction EXAM: LEFT ANKLE COMPLETE - 3+ VIEW COMPARISON:  08/12/2015 FINDINGS: The previously seen fracture has been somewhat reduced. There remains some widening of the ankle mortise  medially although reduced from the prior exam. No other focal abnormality is noted. IMPRESSION: Slight reduction in fracture and displacement of the talus with respect to the tibia. Electronically Signed   By: Inez Catalina M.D.   On: 08/12/2015 23:14  Dg Ankle Complete Left  Result Date: 08/12/2015 CLINICAL DATA:  Left ankle pain, status post fall, bruising/swelling EXAM: LEFT ANKLE COMPLETE - 3+ VIEW COMPARISON:  None. FINDINGS: Mildly displaced oblique distal fibular fracture, with approximately 1/2 shaft width lateral displacement of the lateral malleolus. Widening of the medial malleolus. Moderate lateral soft  tissue swelling. The base of the fifth metatarsal is unremarkable. IMPRESSION: Mildly displaced oblique distal fibular fracture. Widening of the medial ankle mortise. Moderate lateral soft tissue swelling. Electronically Signed   By: Julian Hy M.D.   On: 08/12/2015 18:52  Dg C-arm 1-60 Min  Result Date: 08/19/2015 CLINICAL DATA:  ORIF of left fibular fracture EXAM: DG C-ARM 61-120 MIN; LEFT ANKLE COMPLETE - 3+ VIEW COMPARISON:  None. FINDINGS: Fixation sideplate is noted along the distal fibula. Multiple fixation screws are seen. Two of these traverses into the distal fibula. The fracture fragments are in anatomic alignment. No gross soft tissue abnormality is noted. IMPRESSION: Status post ORIF of distal fibular fracture Electronically Signed   By: Inez Catalina M.D.   On: 08/19/2015 13:31    Disposition: 01-Home or Self Care    Follow-up Information    Marianna Payment, MD In 2 weeks.   Specialty:  Orthopedic Surgery Why:  For suture removal, For wound re-check Contact information: [address] [place] [address] [phone]            Signed: Marianna Payment 08/21/2015, 7:46 AM

## 2015-08-21 NOTE — Discharge Instructions (Signed)
° ° °  1. Keep splint clean and dry 2. Elevate foot above level of the heart 3. Take xarelto to prevent blood clots 4. Take pain meds as needed 5. Strict non weight bearing to operative extremity   -----------------------------------------------------------------------------------------------------------------------------------------------------------------------  Information on my medicine - XARELTO (Rivaroxaban)  This medication education was reviewed with me or my healthcare representative as part of my discharge preparation.    Why was Xarelto prescribed for you? Xarelto was prescribed for you to reduce the risk of a blood clot forming that can cause a stroke if you have a medical condition called atrial fibrillation (a type of irregular heartbeat).  What do you need to know about xarelto ? Take your Xarelto ONCE DAILY at the same time every day with your evening meal. If you have difficulty swallowing the tablet whole, you may crush it and mix in applesauce just prior to taking your dose.  Take Xarelto exactly as prescribed by your doctor and DO NOT stop taking Xarelto without talking to the doctor who prescribed the medication.  Stopping without other stroke prevention medication to take the place of Xarelto may increase your risk of developing a clot that causes a stroke.  Refill your prescription before you run out.  After discharge, you should have regular check-up appointments with your healthcare provider that is prescribing your Xarelto.  In the future your dose may need to be changed if your kidney function or weight changes by a significant amount.  What do you do if you miss a dose? If you are taking Xarelto ONCE DAILY and you miss a dose, take it as soon as you remember on the same day then continue your regularly scheduled once daily regimen the next day. Do not take two doses of Xarelto at the same time or on the same day.   Important Safety Information A  possible side effect of Xarelto is bleeding. You should call your healthcare provider right away if you experience any of the following: ? Bleeding from an injury or your nose that does not stop. ? Unusual colored urine (red or dark brown) or unusual colored stools (red or black). ? Unusual bruising for unknown reasons. ? A serious fall or if you hit your head (even if there is no bleeding).  Some medicines may interact with Xarelto and might increase your risk of bleeding while on Xarelto. To help avoid this, consult your healthcare provider or pharmacist prior to using any new prescription or non-prescription medications, including herbals, vitamins, non-steroidal anti-inflammatory drugs (NSAIDs) and supplements.  This website has more information on Xarelto: [email or web address]-benson.[email or web address]

## 2015-08-31 NOTE — Anesthesia Postprocedure Evaluation (Signed)
Anesthesia Post Note  Patient: Cristina [name]  Procedure(s) Performed: Procedure(s) (LRB): OPEN REDUCTION INTERNAL FIXATION (ORIF) LEFT ANKLE FRACTURE, POSSIBLE SYNDESMOSIS (Left)  Patient location during evaluation: PACU Anesthesia Type: General Level of consciousness: awake Pain management: pain level controlled Vital Signs Assessment: post-procedure vital signs reviewed and stable Respiratory status: spontaneous breathing Cardiovascular status: stable Anesthetic complications: no    Last Vitals:  Vitals:   08/21/15 0556 08/21/15 1300  BP: 107/70 121/75  Pulse: 71 73  Resp: 16 16  Temp: 36.6 C 36.9 C    Last Pain:  Vitals:   08/21/15 1300  TempSrc: Oral  PainSc:                  EDWARDS,Bekka Qian

## 2015-09-03 NOTE — Addendum Note (Signed)
Addendum  created 09/03/15 2143 by Finis Bud, MD   Sign clinical note

## 2015-09-03 NOTE — Anesthesia Postprocedure Evaluation (Signed)
Anesthesia Post Note  Patient: Cristina [name]  Procedure(s) Performed: Procedure(s) (LRB): OPEN REDUCTION INTERNAL FIXATION (ORIF) LEFT ANKLE FRACTURE, POSSIBLE SYNDESMOSIS (Left)  Patient location during evaluation: PACU Anesthesia Type: General Level of consciousness: awake Pain management: pain level controlled Respiratory status: spontaneous breathing Cardiovascular status: stable Anesthetic complications: no    Last Vitals:  Vitals:   08/21/15 0556 08/21/15 1300  BP: 107/70 121/75  Pulse: 71 73  Resp: 16 16  Temp: 36.6 C 36.9 C    Last Pain:  Vitals:   08/21/15 1300  TempSrc: Oral  PainSc:                  EDWARDS,Nasier Thumm

## 2015-09-15 ENCOUNTER — Emergency Department (HOSPITAL_COMMUNITY): Payer: Medicare Other

## 2015-09-15 ENCOUNTER — Emergency Department (HOSPITAL_COMMUNITY)
Admission: EM | Admit: 2015-09-15 | Discharge: 2015-09-15 | Disposition: A | Discharge status: Home / Self Care | Payer: Medicare Other | Attending: Emergency Medicine | Admitting: Emergency Medicine

## 2015-09-15 ENCOUNTER — Encounter (HOSPITAL_COMMUNITY): Payer: Self-pay | Admitting: Emergency Medicine

## 2015-09-15 DIAGNOSIS — J45909 Unspecified asthma, uncomplicated: Secondary | ICD-10-CM | POA: Diagnosis not present

## 2015-09-15 DIAGNOSIS — W010XXA Fall on same level from slipping, tripping and stumbling without subsequent striking against object, initial encounter: Secondary | ICD-10-CM | POA: Insufficient documentation

## 2015-09-15 DIAGNOSIS — Z79899 Other long term (current) drug therapy: Secondary | ICD-10-CM | POA: Insufficient documentation

## 2015-09-15 DIAGNOSIS — Y92002 Bathroom of unspecified non-institutional (private) residence single-family (private) house as the place of occurrence of the external cause: Secondary | ICD-10-CM | POA: Insufficient documentation

## 2015-09-15 DIAGNOSIS — S42402A Unspecified fracture of lower end of left humerus, initial encounter for closed fracture: Secondary | ICD-10-CM

## 2015-09-15 DIAGNOSIS — I5031 Acute diastolic (congestive) heart failure: Secondary | ICD-10-CM | POA: Diagnosis not present

## 2015-09-15 DIAGNOSIS — Z79891 Long term (current) use of opiate analgesic: Secondary | ICD-10-CM | POA: Diagnosis not present

## 2015-09-15 DIAGNOSIS — Y939 Activity, unspecified: Secondary | ICD-10-CM | POA: Insufficient documentation

## 2015-09-15 DIAGNOSIS — R52 Pain, unspecified: Secondary | ICD-10-CM

## 2015-09-15 DIAGNOSIS — S42491A Other displaced fracture of lower end of right humerus, initial encounter for closed fracture: Secondary | ICD-10-CM | POA: Insufficient documentation

## 2015-09-15 DIAGNOSIS — I110 Hypertensive heart disease with heart failure: Secondary | ICD-10-CM | POA: Diagnosis not present

## 2015-09-15 DIAGNOSIS — T148XXA Other injury of unspecified body region, initial encounter: Secondary | ICD-10-CM

## 2015-09-15 DIAGNOSIS — S59901A Unspecified injury of right elbow, initial encounter: Secondary | ICD-10-CM | POA: Diagnosis present

## 2015-09-15 DIAGNOSIS — Y999 Unspecified external cause status: Secondary | ICD-10-CM | POA: Insufficient documentation

## 2015-09-15 MED ORDER — MORPHINE SULFATE (PF) 4 MG/ML IV SOLN
4.0000 mg | Freq: Once | INTRAVENOUS | Status: AC
  Administered 2015-09-15: 4 mg via INTRAVENOUS
  Filled 2015-09-15: qty 1

## 2015-09-15 MED ORDER — OXYCODONE-ACETAMINOPHEN 5-325 MG PO TABS: 1.0000 | ORAL_TABLET | ORAL | 0 refills | Status: DC | PRN

## 2015-09-15 MED ORDER — ONDANSETRON HCL 4 MG/2ML IJ SOLN
4.0000 mg | Freq: Once | INTRAMUSCULAR | Status: AC
  Administered 2015-09-15: 4 mg via INTRAVENOUS
  Filled 2015-09-15: qty 2

## 2015-09-15 MED ORDER — OXYCODONE-ACETAMINOPHEN 5-325 MG PO TABS
1.0000 | ORAL_TABLET | Freq: Once | ORAL | Status: AC
  Administered 2015-09-15: 1 via ORAL
  Filled 2015-09-15: qty 1

## 2015-09-15 NOTE — ED Notes (Signed)
Patient transported to X-ray 

## 2015-09-15 NOTE — ED Notes (Signed)
Bed: WA09 Expected date:  Expected time:  Means of arrival:  Comments: EMS-fall 

## 2015-09-15 NOTE — ED Provider Notes (Signed)
[facility] DEPT Provider Note   CSN: [id number] Arrival date & time: 09/15/15  1654     History   Chief Complaint Chief Complaint  Patient presents with  . Fall    HPI Cristina [name] is a 75 y.o. female.  75yo F w/ PMH including A fib, hypertension, hyperlipidemia who presents with right elbow injury. Just prior to arrival, the patient was at home in her bathroom and got tripped up, falling onto her right elbow. She did not syncopized and states that it was likely because she has just now started to walk again on her left foot which recently had surgery. Currently, she endorses constant, severe right elbow pain that is worse with any kind of movement. No numbness or tingling of her hands. She did not strike her head or lose consciousness. She states that the elbow took the majority of the impact and denies any other significant injury on her body. Specifically, no chest pain, abdominal pain, neck or back pain. No headache or visual changes.   The history is provided by the patient.  Fall     Past Medical History:  Diagnosis Date  . Anxiety   . Asthma   . Atrial fibrillation ([place])   . Dysrhythmia    PAF  . History of gout   . Hyperlipemia   . Hypertension   . Pneumonia ~ 2012    Patient Active Problem List   Diagnosis Date Noted  . S/P ORIF (open reduction internal fixation) fracture 08/19/2015  . Anaphylactic reaction 01/23/2014  . Essential hypertension 08/07/2013  . Atrial fibrillation ([place]) 07/28/2013  . Hypertensive heart disease 07/28/2013  . Chronic diastolic congestive heart failure ([place]) 07/28/2013  . Obesity (BMI 30-39.9)   . Gout   . Current use of long term anticoagulation   . Acute diastolic CHF (congestive heart failure) ([place]) 07/24/2013    Past Surgical History:  Procedure Laterality Date  . ANKLE FRACTURE SURGERY Right 2000s  . BACK SURGERY    . FRACTURE SURGERY    . [place] SURGERY  1990s  . ORIF ANKLE FRACTURE Left 08/19/2015   Procedure: OPEN REDUCTION INTERNAL FIXATION (ORIF) LEFT ANKLE FRACTURE, POSSIBLE SYNDESMOSIS;  Surgeon: Leandrew Koyanagi, MD;  Location: [facility];  Service: Orthopedics;  Laterality: Left;  . TONSILLECTOMY AND ADENOIDECTOMY  1950s    OB History    No data available       Home Medications    Prior to Admission medications   Medication Sig Start Date End Date Taking? Authorizing Provider  ALPRAZolam (XANAX) 0.25 MG tablet Take 0.25 mg by mouth 3 (three) times daily as needed for anxiety.  09/12/15  Yes Historical Provider, MD  atorvastatin (LIPITOR) 20 MG tablet Take 20 mg by mouth at bedtime.    Yes Historical Provider, MD  citalopram (CELEXA) 20 MG tablet Take 20 mg by mouth at bedtime.   Yes Historical Provider, MD  colchicine 0.6 MG tablet Take 0.6 mg by mouth 2 (two) times daily as needed (for gout flares).    Yes Jacolyn Reedy, MD  methocarbamol (ROBAXIN) 750 MG tablet Take 1 tablet (750 mg total) by mouth 2 (two) times daily as needed for muscle spasms. 08/19/15  Yes Leandrew Koyanagi, MD  metoprolol succinate (TOPROL-XL) 100 MG 24 hr tablet Take 100 mg by mouth at bedtime. Take with or immediately following a meal.   Yes Historical Provider, MD  naproxen sodium (ANAPROX) 220 MG tablet Take 440 mg by mouth 2 (two)  times daily as needed (for pain).    Yes Historical Provider, MD  ondansetron (ZOFRAN) 4 MG tablet Take 1-2 tablets (4-8 mg total) by mouth every 8 (eight) hours as needed for nausea or vomiting. 08/19/15  Yes Leandrew Koyanagi, MD  pramipexole (MIRAPEX) 1 MG tablet Take 2 mg by mouth at bedtime.    Yes Historical Provider, MD  rivaroxaban (XARELTO) 20 MG TABS tablet Take 1 tablet (20 mg total) by mouth daily with supper. 03/06/15  Yes Lelon Perla, MD  verapamil (CALAN-SR) 240 MG CR tablet Take 240 mg by mouth at bedtime.   Yes Historical Provider, MD  Vitamin D, Ergocalciferol, (DRISDOL) 50000 units CAPS capsule Take 50,000 Units by mouth every 7 (seven) days. Pt takes on Tuesday.   Yes  Historical Provider, MD  oxyCODONE-acetaminophen (PERCOCET) 5-325 MG tablet Take 1-2 tablets by mouth every 4 (four) hours as needed for severe pain. 09/15/15   Sharlett Iles, MD  senna-docusate (SENOKOT S) 8.6-50 MG tablet Take 1 tablet by mouth at bedtime as needed. Patient not taking: Reported on 09/15/2015 08/19/15   Leandrew Koyanagi, MD    Family History Family History  Problem Relation Age of Onset  . CAD  7    father-mulitple  . Emphysema      mother  . Diabetes Mellitus II      father    Social History Social History  Substance Use Topics  . Smoking status: Never Smoker  . Smokeless tobacco: Never Used  . Alcohol use Yes     Comment: 08/20/2015 "glass of wine maybe once/month"     Allergies   Augmentin [amoxicillin-pot clavulanate] and Lisinopril   Review of Systems Review of Systems 10 Systems reviewed and are negative for acute change except as noted in the HPI.   Physical Exam Updated Vital Signs BP 147/75 (BP Location: Left Arm)   Pulse 103   Temp 98.3 F (36.8 C) (Oral)   Resp 18   Ht 5\' 4"  (1.626 m)   Wt 179 lb (81.2 kg)   SpO2 92%   BMI 30.73 kg/m   Physical Exam  Constitutional: She is oriented to person, place, and time. She appears well-developed and well-nourished. She appears distressed.  Holding R arm, in mild distress due to pain  HENT:  Head: Normocephalic and atraumatic.  Moist mucous membranes  Eyes: Conjunctivae are normal. Pupils are equal, round, and reactive to light.  Neck: Normal range of motion. Neck supple.  No midline c-spine tenderness  Cardiovascular: Normal rate, regular rhythm, normal heart sounds and intact distal pulses.   No murmur heard. Pulmonary/Chest: Effort normal and breath sounds normal. She exhibits no tenderness.  Abdominal: Soft. Bowel sounds are normal. She exhibits no distension. There is no tenderness.  Musculoskeletal: She exhibits edema, tenderness and deformity.  R elbow held at 90 degrees with  significant swelling at joint; normal ROM at wrist and normal grip strength; normal sensation hand  Neurological: She is alert and oriented to person, place, and time.  Fluent speech  Skin: Skin is warm and dry. Capillary refill takes less than 2 seconds. No pallor.  Psychiatric: She has a normal mood and affect. Judgment normal.  Nursing note and vitals reviewed.    ED Treatments / Results  Labs (all labs ordered are listed, but only abnormal results are displayed) Labs Reviewed - No data to display  EKG  EKG Interpretation None       Radiology No results found.  Procedures  Procedures (including critical care time)  Medications Ordered in ED Medications  morphine 4 MG/ML injection 4 mg (4 mg Intravenous Given 09/15/15 1846)  ondansetron (ZOFRAN) injection 4 mg (4 mg Intravenous Given 09/15/15 1846)  oxyCODONE-acetaminophen (PERCOCET/ROXICET) 5-325 MG per tablet 1 tablet (1 tablet Oral Given 09/15/15 2226)     Initial Impression / Assessment and Plan / ED Course  I have reviewed the triage vital signs and the nursing notes.  Pertinent imaging results that were available during my care of the patient were reviewed by me and considered in my medical decision making (see chart for details).  Clinical Course   Patient with right elbow injury after fall at home today. Fentanyl given by EMS in route. The patient was in mild distress due to pain on my examination. She was neurovascularly intact distally. Significant swelling of right elbow suspicious for fracture. Gave morphine and obtained plain film that showed comminuted distal radius fracture. I discussed with her orthopedic surgeon, Dr. Erlinda Hong, and per his recs I obtained CT elbow w/ 3-D recons. Placed in posterior splint and gave percocet to use at home. Patient will contact the clinic in the morning for follow-up appointment this week. Extensively reviewed return precautions including any signs of neurovascular compromise and  reviewed supportive care including ice, elevation, and scheduled pain medications. Patient voiced understanding and was discharged in satisfactory condition.  Final Clinical Impressions(s) / ED Diagnoses   Final diagnoses:  Left elbow fracture  Closed fracture of left distal humerus, initial encounter    New Prescriptions Discharge Medication List as of 09/15/2015 10:20 PM    START taking these medications   Details  oxyCODONE-acetaminophen (PERCOCET) 5-325 MG tablet Take 1-2 tablets by mouth every 4 (four) hours as needed for severe pain., Starting Tue 09/15/2015, Print         Sharlett Iles, MD 09/18/15 [phone]

## 2015-09-15 NOTE — ED Triage Notes (Addendum)
Per EMS, patient is from home.  Patient fell today at home when getting out of bathroom.  She hurt her right elbow.  EMS states she has a deformity to elbow.  Patient denies hitting head, LOC, or black outs.   EMS administered 200 mcg Fentayl   BP:212/126 HR:110 R:18 94% on room air

## 2015-09-23 ENCOUNTER — Encounter (HOSPITAL_COMMUNITY): Payer: Self-pay | Admitting: *Deleted

## 2015-09-23 NOTE — Progress Notes (Signed)
Pt denies any recent chest pain. States she does have sob at times with exertion. Hx of A-fib and is on Xarelto (last dose 09/21/15). Pt sees Dr. Sabra Heck for her A-fib care.

## 2015-09-24 ENCOUNTER — Encounter (HOSPITAL_COMMUNITY)
Admission: RE | Disposition: A | Discharge status: Hospice | Payer: Self-pay | Source: Ambulatory Visit | Attending: Pulmonary Disease

## 2015-09-24 ENCOUNTER — Inpatient Hospital Stay (HOSPITAL_COMMUNITY): Payer: Medicare Other | Admitting: Anesthesiology

## 2015-09-24 ENCOUNTER — Inpatient Hospital Stay (HOSPITAL_COMMUNITY)
Admission: RE | Admit: 2015-09-24 | Discharge: 2015-10-15 | DRG: 003 | Disposition: A | Discharge status: Hospice | Payer: Medicare Other | Source: Ambulatory Visit | Attending: Internal Medicine | Admitting: Internal Medicine

## 2015-09-24 ENCOUNTER — Inpatient Hospital Stay (HOSPITAL_COMMUNITY): Payer: Medicare Other

## 2015-09-24 ENCOUNTER — Encounter (HOSPITAL_COMMUNITY): Payer: Self-pay | Admitting: *Deleted

## 2015-09-24 DIAGNOSIS — N184 Chronic kidney disease, stage 4 (severe): Secondary | ICD-10-CM | POA: Diagnosis present

## 2015-09-24 DIAGNOSIS — N170 Acute kidney failure with tubular necrosis: Secondary | ICD-10-CM | POA: Diagnosis not present

## 2015-09-24 DIAGNOSIS — Z7189 Other specified counseling: Secondary | ICD-10-CM

## 2015-09-24 DIAGNOSIS — Z4659 Encounter for fitting and adjustment of other gastrointestinal appliance and device: Secondary | ICD-10-CM

## 2015-09-24 DIAGNOSIS — N179 Acute kidney failure, unspecified: Secondary | ICD-10-CM

## 2015-09-24 DIAGNOSIS — R6521 Severe sepsis with septic shock: Secondary | ICD-10-CM | POA: Diagnosis not present

## 2015-09-24 DIAGNOSIS — E43 Unspecified severe protein-calorie malnutrition: Secondary | ICD-10-CM | POA: Diagnosis not present

## 2015-09-24 DIAGNOSIS — I481 Persistent atrial fibrillation: Secondary | ICD-10-CM | POA: Diagnosis not present

## 2015-09-24 DIAGNOSIS — R7989 Other specified abnormal findings of blood chemistry: Secondary | ICD-10-CM

## 2015-09-24 DIAGNOSIS — Z6830 Body mass index (BMI) 30.0-30.9, adult: Secondary | ICD-10-CM

## 2015-09-24 DIAGNOSIS — Z515 Encounter for palliative care: Secondary | ICD-10-CM

## 2015-09-24 DIAGNOSIS — I110 Hypertensive heart disease with heart failure: Secondary | ICD-10-CM | POA: Diagnosis present

## 2015-09-24 DIAGNOSIS — M109 Gout, unspecified: Secondary | ICD-10-CM | POA: Diagnosis present

## 2015-09-24 DIAGNOSIS — Z452 Encounter for adjustment and management of vascular access device: Secondary | ICD-10-CM

## 2015-09-24 DIAGNOSIS — R001 Bradycardia, unspecified: Secondary | ICD-10-CM

## 2015-09-24 DIAGNOSIS — I5032 Chronic diastolic (congestive) heart failure: Secondary | ICD-10-CM | POA: Diagnosis present

## 2015-09-24 DIAGNOSIS — Z8701 Personal history of pneumonia (recurrent): Secondary | ICD-10-CM

## 2015-09-24 DIAGNOSIS — S42461A Displaced fracture of medial condyle of right humerus, initial encounter for closed fracture: Principal | ICD-10-CM | POA: Diagnosis present

## 2015-09-24 DIAGNOSIS — I951 Orthostatic hypotension: Secondary | ICD-10-CM

## 2015-09-24 DIAGNOSIS — D696 Thrombocytopenia, unspecified: Secondary | ICD-10-CM | POA: Diagnosis not present

## 2015-09-24 DIAGNOSIS — J9601 Acute respiratory failure with hypoxia: Secondary | ICD-10-CM

## 2015-09-24 DIAGNOSIS — Z8781 Personal history of (healed) traumatic fracture: Secondary | ICD-10-CM

## 2015-09-24 DIAGNOSIS — T447X5A Adverse effect of beta-adrenoreceptor antagonists, initial encounter: Secondary | ICD-10-CM | POA: Diagnosis not present

## 2015-09-24 DIAGNOSIS — I248 Other forms of acute ischemic heart disease: Secondary | ICD-10-CM | POA: Diagnosis not present

## 2015-09-24 DIAGNOSIS — E875 Hyperkalemia: Secondary | ICD-10-CM | POA: Diagnosis not present

## 2015-09-24 DIAGNOSIS — R05 Cough: Secondary | ICD-10-CM

## 2015-09-24 DIAGNOSIS — D62 Acute posthemorrhagic anemia: Secondary | ICD-10-CM | POA: Diagnosis not present

## 2015-09-24 DIAGNOSIS — K7200 Acute and subacute hepatic failure without coma: Secondary | ICD-10-CM

## 2015-09-24 DIAGNOSIS — Z419 Encounter for procedure for purposes other than remedying health state, unspecified: Secondary | ICD-10-CM

## 2015-09-24 DIAGNOSIS — I482 Chronic atrial fibrillation: Secondary | ICD-10-CM | POA: Diagnosis present

## 2015-09-24 DIAGNOSIS — E870 Hyperosmolality and hypernatremia: Secondary | ICD-10-CM | POA: Diagnosis not present

## 2015-09-24 DIAGNOSIS — R410 Disorientation, unspecified: Secondary | ICD-10-CM

## 2015-09-24 DIAGNOSIS — L8990 Pressure ulcer of unspecified site, unspecified stage: Secondary | ICD-10-CM | POA: Insufficient documentation

## 2015-09-24 DIAGNOSIS — Y95 Nosocomial condition: Secondary | ICD-10-CM | POA: Diagnosis not present

## 2015-09-24 DIAGNOSIS — D638 Anemia in other chronic diseases classified elsewhere: Secondary | ICD-10-CM | POA: Diagnosis present

## 2015-09-24 DIAGNOSIS — I952 Hypotension due to drugs: Secondary | ICD-10-CM | POA: Diagnosis present

## 2015-09-24 DIAGNOSIS — J189 Pneumonia, unspecified organism: Secondary | ICD-10-CM

## 2015-09-24 DIAGNOSIS — I480 Paroxysmal atrial fibrillation: Secondary | ICD-10-CM | POA: Diagnosis present

## 2015-09-24 DIAGNOSIS — Z9289 Personal history of other medical treatment: Secondary | ICD-10-CM

## 2015-09-24 DIAGNOSIS — Z888 Allergy status to other drugs, medicaments and biological substances status: Secondary | ICD-10-CM

## 2015-09-24 DIAGNOSIS — E2740 Unspecified adrenocortical insufficiency: Secondary | ICD-10-CM | POA: Diagnosis not present

## 2015-09-24 DIAGNOSIS — D6489 Other specified anemias: Secondary | ICD-10-CM | POA: Diagnosis not present

## 2015-09-24 DIAGNOSIS — M6281 Muscle weakness (generalized): Secondary | ICD-10-CM

## 2015-09-24 DIAGNOSIS — Z01818 Encounter for other preprocedural examination: Secondary | ICD-10-CM

## 2015-09-24 DIAGNOSIS — Z930 Tracheostomy status: Secondary | ICD-10-CM

## 2015-09-24 DIAGNOSIS — J9600 Acute respiratory failure, unspecified whether with hypoxia or hypercapnia: Secondary | ICD-10-CM

## 2015-09-24 DIAGNOSIS — E876 Hypokalemia: Secondary | ICD-10-CM | POA: Diagnosis not present

## 2015-09-24 DIAGNOSIS — J9690 Respiratory failure, unspecified, unspecified whether with hypoxia or hypercapnia: Secondary | ICD-10-CM

## 2015-09-24 DIAGNOSIS — E785 Hyperlipidemia, unspecified: Secondary | ICD-10-CM | POA: Diagnosis present

## 2015-09-24 DIAGNOSIS — S42411A Displaced simple supracondylar fracture without intercondylar fracture of right humerus, initial encounter for closed fracture: Secondary | ICD-10-CM

## 2015-09-24 DIAGNOSIS — I4819 Other persistent atrial fibrillation: Secondary | ICD-10-CM

## 2015-09-24 DIAGNOSIS — I340 Nonrheumatic mitral (valve) insufficiency: Secondary | ICD-10-CM | POA: Diagnosis present

## 2015-09-24 DIAGNOSIS — Z79899 Other long term (current) drug therapy: Secondary | ICD-10-CM

## 2015-09-24 DIAGNOSIS — R059 Cough, unspecified: Secondary | ICD-10-CM

## 2015-09-24 DIAGNOSIS — A419 Sepsis, unspecified organism: Secondary | ICD-10-CM | POA: Diagnosis not present

## 2015-09-24 DIAGNOSIS — Z881 Allergy status to other antibiotic agents status: Secondary | ICD-10-CM

## 2015-09-24 DIAGNOSIS — T461X5A Adverse effect of calcium-channel blockers, initial encounter: Secondary | ICD-10-CM | POA: Diagnosis not present

## 2015-09-24 DIAGNOSIS — R778 Other specified abnormalities of plasma proteins: Secondary | ICD-10-CM

## 2015-09-24 DIAGNOSIS — S42451A Displaced fracture of lateral condyle of right humerus, initial encounter for closed fracture: Secondary | ICD-10-CM | POA: Diagnosis present

## 2015-09-24 DIAGNOSIS — I272 Other secondary pulmonary hypertension: Secondary | ICD-10-CM | POA: Diagnosis not present

## 2015-09-24 DIAGNOSIS — Z9889 Other specified postprocedural states: Secondary | ICD-10-CM

## 2015-09-24 DIAGNOSIS — E669 Obesity, unspecified: Secondary | ICD-10-CM | POA: Diagnosis present

## 2015-09-24 DIAGNOSIS — W19XXXA Unspecified fall, initial encounter: Secondary | ICD-10-CM | POA: Diagnosis present

## 2015-09-24 DIAGNOSIS — E861 Hypovolemia: Secondary | ICD-10-CM | POA: Diagnosis present

## 2015-09-24 DIAGNOSIS — D689 Coagulation defect, unspecified: Secondary | ICD-10-CM | POA: Diagnosis not present

## 2015-09-24 DIAGNOSIS — Z66 Do not resuscitate: Secondary | ICD-10-CM | POA: Diagnosis not present

## 2015-09-24 DIAGNOSIS — Z7901 Long term (current) use of anticoagulants: Secondary | ICD-10-CM

## 2015-09-24 DIAGNOSIS — R739 Hyperglycemia, unspecified: Secondary | ICD-10-CM | POA: Diagnosis not present

## 2015-09-24 DIAGNOSIS — G9341 Metabolic encephalopathy: Secondary | ICD-10-CM | POA: Diagnosis not present

## 2015-09-24 DIAGNOSIS — F419 Anxiety disorder, unspecified: Secondary | ICD-10-CM | POA: Diagnosis present

## 2015-09-24 HISTORY — PX: ORIF HUMERUS FRACTURE: SHX2126

## 2015-09-24 HISTORY — DX: Reserved for inherently not codable concepts without codable children: IMO0001

## 2015-09-24 LAB — BASIC METABOLIC PANEL
Anion gap: 10 (ref 5–15)
Anion gap: 6 (ref 5–15)
BUN: 16 mg/dL (ref 6–20)
BUN: 13 mg/dL (ref 6–20)
CALCIUM: 8.3 mg/dL — AB (ref 8.9–10.3)
CHLORIDE: 105 mmol/L (ref 101–111)
CHLORIDE: 105 mmol/L (ref 101–111)
CO2: 29 mmol/L (ref 22–32)
CO2: 31 mmol/L (ref 22–32)
CREATININE: 0.77 mg/dL (ref 0.44–1.00)
Calcium: 9.4 mg/dL (ref 8.9–10.3)
Creatinine, Ser: 0.73 mg/dL (ref 0.44–1.00)
GFR calc Af Amer: >60 mL/min (ref >60)
GFR calc non Af Amer: >60 mL/min (ref >60)
GFR calc non Af Amer: >60 mL/min (ref >60)
GLUCOSE: 103 mg/dL — AB (ref 65–99)
Glucose, Bld: 100 mg/dL — ABNORMAL HIGH (ref 65–99)
POTASSIUM: 3.6 mmol/L (ref 3.5–5.1)
Potassium: 3.5 mmol/L (ref 3.5–5.1)
SODIUM: 144 mmol/L (ref 135–145)
Sodium: 142 mmol/L (ref 135–145)

## 2015-09-24 LAB — CBC
HCT: 32.2 % — ABNORMAL LOW (ref 36.0–46.0)
HEMATOCRIT: 41.7 % (ref 36.0–46.0)
HEMOGLOBIN: 9.7 g/dL — AB (ref 12.0–15.0)
Hemoglobin: 12.8 g/dL (ref 12.0–15.0)
MCH: 28.1 pg (ref 26.0–34.0)
MCH: 27.6 pg (ref 26.0–34.0)
MCHC: 30.7 g/dL (ref 30.0–36.0)
MCHC: 30.1 g/dL (ref 30.0–36.0)
MCV: 91.6 fL (ref 78.0–100.0)
MCV: 91.5 fL (ref 78.0–100.0)
PLATELETS: 382 10*3/uL (ref 150–400)
Platelets: 445 10*3/uL — ABNORMAL HIGH (ref 150–400)
RBC: 4.55 MIL/uL (ref 3.87–5.11)
RBC: 3.52 MIL/uL — AB (ref 3.87–5.11)
RDW: 14.8 % (ref 11.5–15.5)
RDW: 14.9 % (ref 11.5–15.5)
WBC: 11.5 10*3/uL — AB (ref 4.0–10.5)
WBC: 12.5 10*3/uL — ABNORMAL HIGH (ref 4.0–10.5)

## 2015-09-24 LAB — PROTIME-INR
INR: 1.1
Prothrombin Time: 14.2 seconds (ref 11.4–15.2)

## 2015-09-24 SURGERY — OPEN REDUCTION INTERNAL FIXATION (ORIF) DISTAL HUMERUS FRACTURE
Anesthesia: General | Site: Arm Upper | Laterality: Right

## 2015-09-24 MED ORDER — OXYCODONE-ACETAMINOPHEN 5-325 MG PO TABS
1.0000 | ORAL_TABLET | ORAL | Status: DC | PRN
Start: 2015-09-24 — End: 2015-09-27

## 2015-09-24 MED ORDER — ONDANSETRON HCL 4 MG/2ML IJ SOLN
INTRAMUSCULAR | Status: DC | PRN
  Administered 2015-09-24 (×2): 4 mg via INTRAVENOUS

## 2015-09-24 MED ORDER — ONDANSETRON HCL 4 MG/2ML IJ SOLN: 4.0000 mg | Freq: Four times a day (QID) | INTRAMUSCULAR | Status: DC | PRN

## 2015-09-24 MED ORDER — PRAMIPEXOLE DIHYDROCHLORIDE 1 MG PO TABS
2.0000 mg | ORAL_TABLET | Freq: Every day | ORAL | Status: DC
  Administered 2015-09-24 – 2015-10-13 (×20): 2 mg via ORAL
  Filled 2015-09-24 (×24): qty 2

## 2015-09-24 MED ORDER — CITALOPRAM HYDROBROMIDE 20 MG PO TABS
20.0000 mg | ORAL_TABLET | Freq: Every day | ORAL | Status: DC
  Administered 2015-09-24 – 2015-10-04 (×11): 20 mg via ORAL
  Filled 2015-09-24 (×12): qty 1

## 2015-09-24 MED ORDER — LIDOCAINE HCL (CARDIAC) 20 MG/ML IV SOLN
INTRAVENOUS | Status: DC | PRN
  Administered 2015-09-24: 100 mg via INTRATRACHEAL

## 2015-09-24 MED ORDER — METOPROLOL TARTARATE 1 MG/ML SYRINGE (5ML)
Status: DC | PRN
Start: 2015-09-24 — End: 2015-09-24
  Administered 2015-09-24: 1 mg via INTRAVENOUS

## 2015-09-24 MED ORDER — VITAMIN D (ERGOCALCIFEROL) 1.25 MG (50000 UNIT) PO CAPS
50000.0000 [IU] | ORAL_CAPSULE | ORAL | Status: DC
  Administered 2015-10-14: 50000 [IU] via ORAL
  Filled 2015-09-24 (×3): qty 1

## 2015-09-24 MED ORDER — PHENYLEPHRINE HCL 10 MG/ML IJ SOLN
INTRAMUSCULAR | Status: DC | PRN
  Administered 2015-09-24: 80 ug via INTRAVENOUS
  Administered 2015-09-24: 120 ug via INTRAVENOUS
  Administered 2015-09-24: 40 ug via INTRAVENOUS
  Administered 2015-09-24 (×2): 80 ug via INTRAVENOUS
  Administered 2015-09-24: 120 ug via INTRAVENOUS
  Administered 2015-09-24: 200 ug via INTRAVENOUS
  Administered 2015-09-24: 120 ug via INTRAVENOUS

## 2015-09-24 MED ORDER — EPHEDRINE SULFATE 50 MG/ML IJ SOLN
INTRAMUSCULAR | Status: DC | PRN
  Administered 2015-09-24 (×2): 15 mg via INTRAVENOUS
  Administered 2015-09-24 (×2): 10 mg via INTRAVENOUS

## 2015-09-24 MED ORDER — MIDAZOLAM HCL 2 MG/2ML IJ SOLN
INTRAMUSCULAR | Status: AC
  Filled 2015-09-24: qty 2

## 2015-09-24 MED ORDER — VASOPRESSIN 20 UNIT/ML IV SOLN
INTRAVENOUS | Status: AC
  Filled 2015-09-24: qty 1

## 2015-09-24 MED ORDER — VANCOMYCIN HCL IN DEXTROSE 1-5 GM/200ML-% IV SOLN
1000.0000 mg | Freq: Once | INTRAVENOUS | Status: AC
  Administered 2015-09-24: 1000 mg via INTRAVENOUS

## 2015-09-24 MED ORDER — GLYCOPYRROLATE 0.2 MG/ML IV SOSY
PREFILLED_SYRINGE | INTRAVENOUS | Status: AC
  Filled 2015-09-24: qty 3

## 2015-09-24 MED ORDER — GLYCOPYRROLATE 0.2 MG/ML IJ SOLN
INTRAMUSCULAR | Status: DC | PRN
  Administered 2015-09-24: 0.1 mg via INTRAVENOUS
  Administered 2015-09-24: 0.2 mg via INTRAVENOUS
  Administered 2015-09-24: 0.1 mg via INTRAVENOUS

## 2015-09-24 MED ORDER — PHENYLEPHRINE 40 MCG/ML (10ML) SYRINGE FOR IV PUSH (FOR BLOOD PRESSURE SUPPORT)
PREFILLED_SYRINGE | INTRAVENOUS | Status: AC
Start: 2015-09-24 — End: 2015-09-24
  Filled 2015-09-24: qty 20

## 2015-09-24 MED ORDER — SORBITOL 70 % SOLN
30.0000 mL | Freq: Every day | Status: DC | PRN
  Administered 2015-10-05: 30 mL via ORAL
  Filled 2015-09-24 (×2): qty 30

## 2015-09-24 MED ORDER — POLYETHYLENE GLYCOL 3350 17 G PO PACK
17.0000 g | PACK | Freq: Every day | ORAL | Status: DC | PRN
  Filled 2015-09-24: qty 1

## 2015-09-24 MED ORDER — 0.9 % SODIUM CHLORIDE (POUR BTL) OPTIME
TOPICAL | Status: DC | PRN
  Administered 2015-09-24: 1000 mL

## 2015-09-24 MED ORDER — KETAMINE HCL-SODIUM CHLORIDE 100-0.9 MG/10ML-% IV SOSY
PREFILLED_SYRINGE | INTRAVENOUS | Status: AC
  Filled 2015-09-24: qty 10

## 2015-09-24 MED ORDER — PROPOFOL 10 MG/ML IV BOLUS
INTRAVENOUS | Status: DC | PRN
  Administered 2015-09-24: 150 mg via INTRAVENOUS

## 2015-09-24 MED ORDER — ATORVASTATIN CALCIUM 20 MG PO TABS
20.0000 mg | ORAL_TABLET | Freq: Every day | ORAL | Status: DC
  Administered 2015-09-24 – 2015-09-28 (×5): 20 mg via ORAL
  Filled 2015-09-24 (×5): qty 1

## 2015-09-24 MED ORDER — SENNOSIDES-DOCUSATE SODIUM 8.6-50 MG PO TABS: 1.0000 | ORAL_TABLET | Freq: Every evening | ORAL | Status: DC | PRN

## 2015-09-24 MED ORDER — PROPOFOL 10 MG/ML IV BOLUS
INTRAVENOUS | Status: AC
  Filled 2015-09-24: qty 20

## 2015-09-24 MED ORDER — VERAPAMIL HCL ER 240 MG PO TBCR
240.0000 mg | EXTENDED_RELEASE_TABLET | Freq: Every day | ORAL | Status: DC
  Administered 2015-09-25 – 2015-09-26 (×2): 240 mg via ORAL
  Filled 2015-09-24 (×3): qty 1

## 2015-09-24 MED ORDER — SUCCINYLCHOLINE CHLORIDE 20 MG/ML IJ SOLN
INTRAMUSCULAR | Status: DC | PRN
Start: 2015-09-24 — End: 2015-09-24
  Administered 2015-09-24: 100 mg via INTRAVENOUS

## 2015-09-24 MED ORDER — ONDANSETRON HCL 4 MG PO TABS: 4.0000 mg | ORAL_TABLET | Freq: Three times a day (TID) | ORAL | Status: DC | PRN

## 2015-09-24 MED ORDER — SODIUM CHLORIDE 0.9 % IV BOLUS (SEPSIS)
250.0000 mL | Freq: Once | INTRAVENOUS | Status: AC
Start: 2015-09-24 — End: 2015-09-24
  Administered 2015-09-24: 250 mL via INTRAVENOUS

## 2015-09-24 MED ORDER — PHENYLEPHRINE HCL 10 MG/ML IJ SOLN
INTRAMUSCULAR | Status: DC | PRN
  Administered 2015-09-24: 50 ug/min via INTRAVENOUS

## 2015-09-24 MED ORDER — MIDAZOLAM HCL 2 MG/2ML IJ SOLN
1.5000 mg | Freq: Once | INTRAMUSCULAR | Status: AC
  Administered 2015-09-24: 1.5 mg via INTRAVENOUS

## 2015-09-24 MED ORDER — VANCOMYCIN HCL IN DEXTROSE 1-5 GM/200ML-% IV SOLN
1000.0000 mg | Freq: Two times a day (BID) | INTRAVENOUS | Status: AC
  Administered 2015-09-24 – 2015-09-25 (×2): 1000 mg via INTRAVENOUS
  Filled 2015-09-24 (×2): qty 200

## 2015-09-24 MED ORDER — ONDANSETRON HCL 4 MG/2ML IJ SOLN
INTRAMUSCULAR | Status: AC
  Filled 2015-09-24: qty 2

## 2015-09-24 MED ORDER — LACTATED RINGERS IV SOLN
INTRAVENOUS | Status: DC
  Administered 2015-09-24: 13:00:00 via INTRAVENOUS
  Administered 2015-09-24: 10 mL/h via INTRAVENOUS
  Administered 2015-09-24: 14:00:00 via INTRAVENOUS

## 2015-09-24 MED ORDER — FENTANYL CITRATE (PF) 100 MCG/2ML IJ SOLN
INTRAMUSCULAR | Status: AC
  Filled 2015-09-24: qty 2

## 2015-09-24 MED ORDER — METHOCARBAMOL 500 MG PO TABS
500.0000 mg | ORAL_TABLET | Freq: Four times a day (QID) | ORAL | Status: DC | PRN
  Administered 2015-09-25 – 2015-09-26 (×3): 500 mg via ORAL
  Filled 2015-09-24 (×3): qty 1

## 2015-09-24 MED ORDER — MORPHINE SULFATE (PF) 2 MG/ML IV SOLN
1.0000 mg | INTRAVENOUS | Status: DC | PRN
  Administered 2015-09-25: 1 mg via INTRAVENOUS
  Filled 2015-09-24: qty 1

## 2015-09-24 MED ORDER — KETAMINE HCL 10 MG/ML IJ SOLN
INTRAMUSCULAR | Status: DC | PRN
  Administered 2015-09-24 (×4): 20 mg via INTRAVENOUS

## 2015-09-24 MED ORDER — SODIUM CHLORIDE 0.9 % IV SOLN
INTRAVENOUS | Status: DC
  Administered 2015-09-24 – 2015-09-25 (×3): 125 mL/h via INTRAVENOUS
  Administered 2015-09-25: 14:00:00 via INTRAVENOUS
  Administered 2015-09-25 – 2015-09-26 (×2): 125 mL/h via INTRAVENOUS

## 2015-09-24 MED ORDER — MAGNESIUM CITRATE PO SOLN: 1.0000 | Freq: Once | ORAL | Status: DC | PRN

## 2015-09-24 MED ORDER — METOPROLOL SUCCINATE ER 100 MG PO TB24
100.0000 mg | ORAL_TABLET | Freq: Every day | ORAL | Status: DC
  Administered 2015-09-25 – 2015-09-26 (×2): 100 mg via ORAL
  Filled 2015-09-24 (×2): qty 1

## 2015-09-24 MED ORDER — LIDOCAINE 2% (20 MG/ML) 5 ML SYRINGE
INTRAMUSCULAR | Status: AC
  Filled 2015-09-24: qty 5

## 2015-09-24 MED ORDER — VASOPRESSIN 20 UNIT/ML IV SOLN
INTRAVENOUS | Status: DC | PRN
  Administered 2015-09-24: 1 [IU] via INTRAVENOUS

## 2015-09-24 MED ORDER — OXYCODONE HCL 5 MG PO TABS: 5.0000 mg | ORAL_TABLET | ORAL | 0 refills | Status: DC | PRN

## 2015-09-24 MED ORDER — COLCHICINE 0.6 MG PO TABS: 0.6000 mg | ORAL_TABLET | Freq: Two times a day (BID) | ORAL | Status: DC | PRN

## 2015-09-24 MED ORDER — EPHEDRINE 5 MG/ML INJ
INTRAVENOUS | Status: AC
  Filled 2015-09-24: qty 10

## 2015-09-24 MED ORDER — METOCLOPRAMIDE HCL 5 MG/ML IJ SOLN: 5.0000 mg | Freq: Three times a day (TID) | INTRAMUSCULAR | Status: DC | PRN

## 2015-09-24 MED ORDER — RIVAROXABAN 20 MG PO TABS
20.0000 mg | ORAL_TABLET | Freq: Every day | ORAL | Status: DC
  Administered 2015-09-25 – 2015-09-27 (×3): 20 mg via ORAL
  Filled 2015-09-24 (×3): qty 1

## 2015-09-24 MED ORDER — METOCLOPRAMIDE HCL 5 MG PO TABS
5.0000 mg | ORAL_TABLET | Freq: Three times a day (TID) | ORAL | Status: DC | PRN
  Administered 2015-09-27: 10 mg via ORAL
  Filled 2015-09-24 (×2): qty 2

## 2015-09-24 MED ORDER — METHOCARBAMOL 500 MG PO TABS
750.0000 mg | ORAL_TABLET | Freq: Two times a day (BID) | ORAL | Status: DC | PRN
  Administered 2015-09-30: 750 mg via ORAL
  Filled 2015-09-24: qty 2

## 2015-09-24 MED ORDER — ALPRAZOLAM 0.25 MG PO TABS
0.2500 mg | ORAL_TABLET | Freq: Three times a day (TID) | ORAL | Status: DC | PRN
  Administered 2015-09-24 – 2015-09-27 (×3): 0.25 mg via ORAL
  Filled 2015-09-24 (×3): qty 1

## 2015-09-24 MED ORDER — ROCURONIUM BROMIDE 10 MG/ML (PF) SYRINGE
PREFILLED_SYRINGE | INTRAVENOUS | Status: AC
  Filled 2015-09-24: qty 10

## 2015-09-24 MED ORDER — FENTANYL CITRATE (PF) 100 MCG/2ML IJ SOLN
INTRAMUSCULAR | Status: DC | PRN
  Administered 2015-09-24 (×2): 25 ug via INTRAVENOUS
  Administered 2015-09-24 (×3): 50 ug via INTRAVENOUS

## 2015-09-24 MED ORDER — FENTANYL CITRATE (PF) 100 MCG/2ML IJ SOLN
50.0000 ug | Freq: Once | INTRAMUSCULAR | Status: AC
  Administered 2015-09-24: 50 ug via INTRAVENOUS

## 2015-09-24 MED ORDER — METHOCARBAMOL 1000 MG/10ML IJ SOLN
500.0000 mg | Freq: Four times a day (QID) | INTRAMUSCULAR | Status: DC | PRN
  Filled 2015-09-24: qty 5

## 2015-09-24 MED ORDER — DIPHENHYDRAMINE HCL 12.5 MG/5ML PO ELIX
25.0000 mg | ORAL_SOLUTION | ORAL | Status: DC | PRN
  Filled 2015-09-24: qty 10

## 2015-09-24 MED ORDER — ARTIFICIAL TEARS OP OINT
TOPICAL_OINTMENT | OPHTHALMIC | Status: AC
  Filled 2015-09-24: qty 3.5

## 2015-09-24 MED ORDER — ONDANSETRON HCL 4 MG PO TABS: 4.0000 mg | ORAL_TABLET | Freq: Four times a day (QID) | ORAL | Status: DC | PRN

## 2015-09-24 MED ORDER — OXYCODONE HCL 5 MG PO TABS
5.0000 mg | ORAL_TABLET | ORAL | Status: DC | PRN
  Administered 2015-09-25 – 2015-09-26 (×4): 5 mg via ORAL
  Administered 2015-09-26: 10 mg via ORAL
  Filled 2015-09-24: qty 1
  Filled 2015-09-24: qty 2
  Filled 2015-09-24 (×3): qty 1

## 2015-09-24 MED ORDER — VANCOMYCIN HCL IN DEXTROSE 1-5 GM/200ML-% IV SOLN
INTRAVENOUS | Status: AC
  Filled 2015-09-24: qty 200

## 2015-09-24 MED ORDER — METOPROLOL TARTRATE 5 MG/5ML IV SOLN
INTRAVENOUS | Status: AC
  Filled 2015-09-24: qty 5

## 2015-09-24 MED ORDER — ACETAMINOPHEN 650 MG RE SUPP: 650.0000 mg | Freq: Four times a day (QID) | RECTAL | Status: DC | PRN

## 2015-09-24 MED ORDER — SUCCINYLCHOLINE CHLORIDE 200 MG/10ML IV SOSY
PREFILLED_SYRINGE | INTRAVENOUS | Status: AC
  Filled 2015-09-24: qty 10

## 2015-09-24 MED ORDER — OXYCODONE HCL ER 10 MG PO T12A: 10.0000 mg | EXTENDED_RELEASE_TABLET | Freq: Two times a day (BID) | ORAL | 0 refills | Status: DC

## 2015-09-24 MED ORDER — ACETAMINOPHEN 325 MG PO TABS: 650.0000 mg | ORAL_TABLET | Freq: Four times a day (QID) | ORAL | Status: DC | PRN

## 2015-09-24 SURGICAL SUPPLY — 91 items
BANDAGE ELASTIC 3 VELCRO ST LF (GAUZE/BANDAGES/DRESSINGS) ×2 IMPLANT
BANDAGE ELASTIC 4 VELCRO ST LF (GAUZE/BANDAGES/DRESSINGS) ×2 IMPLANT
BIT DRILL 2.5X2.75 QC CALB (BIT) ×2 IMPLANT
BIT DRILL CALIBRATED 2.7 (BIT) ×2 IMPLANT
BIT DRILL CALIBRATED 2.7MM (BIT) ×2
BLADE OSCILLATING/SAGITTAL (BLADE) ×3
BLADE SW THK.38XMED NAR THN (BLADE) IMPLANT
BNDG CMPR 9X4 STRL LF SNTH (GAUZE/BANDAGES/DRESSINGS)
BNDG ESMARK 4X9 LF (GAUZE/BANDAGES/DRESSINGS) IMPLANT
CANISTER SUCTION 1500CC (MISCELLANEOUS) ×2 IMPLANT
CLOSURE WOUND 1/2 X4 (GAUZE/BANDAGES/DRESSINGS)
CORDS BIPOLAR (ELECTRODE) ×2 IMPLANT
COVER SURGICAL LIGHT HANDLE (MISCELLANEOUS) ×5 IMPLANT
CUFF TOURNIQUET SINGLE 18IN (TOURNIQUET CUFF) ×3 IMPLANT
CUFF TOURNIQUET SINGLE 24IN (TOURNIQUET CUFF) IMPLANT
DRAIN PENROSE 1/4X12 LTX STRL (WOUND CARE) ×4 IMPLANT
DRAPE C-ARM 42X72 X-RAY (DRAPES) ×3 IMPLANT
DRAPE IMP U-DRAPE 54X76 (DRAPES) ×3 IMPLANT
DRAPE INCISE IOBAN 66X45 STRL (DRAPES) ×3 IMPLANT
DRAPE U-SHAPE 47X51 STRL (DRAPES) ×3 IMPLANT
DRSG PAD ABDOMINAL 8X10 ST (GAUZE/BANDAGES/DRESSINGS) ×2 IMPLANT
DRSG TEGADERM 4X4.75 (GAUZE/BANDAGES/DRESSINGS) ×12 IMPLANT
ELECT CAUTERY BLADE 6.4 (BLADE) ×3 IMPLANT
ELECT REM PT RETURN 9FT ADLT (ELECTROSURGICAL) ×3
ELECTRODE REM PT RTRN 9FT ADLT (ELECTROSURGICAL) ×1 IMPLANT
FACESHIELD WRAPAROUND (MASK) ×3 IMPLANT
FACESHIELD WRAPAROUND OR TEAM (MASK) ×1 IMPLANT
GAUZE SPONGE 4X4 12PLY STRL (GAUZE/BANDAGES/DRESSINGS) ×3 IMPLANT
GAUZE XEROFORM 1X8 LF (GAUZE/BANDAGES/DRESSINGS) ×3 IMPLANT
GAUZE XEROFORM 5X9 LF (GAUZE/BANDAGES/DRESSINGS) ×3 IMPLANT
GLOVE SKINSENSE NS SZ7.5 (GLOVE) ×8
GLOVE SKINSENSE STRL SZ7.5 (GLOVE) ×4 IMPLANT
GOWN STRL REIN XL XLG (GOWN DISPOSABLE) ×6 IMPLANT
K-WIRE FIXATION 2.0X6 (WIRE) ×21
KIT BASIN OR (CUSTOM PROCEDURE TRAY) ×3 IMPLANT
KIT ROOM TURNOVER OR (KITS) ×3 IMPLANT
KWIRE FIXATION 2.0X6 (WIRE) IMPLANT
MANIFOLD NEPTUNE II (INSTRUMENTS) ×3 IMPLANT
NS IRRIG 1000ML POUR BTL (IV SOLUTION) ×3 IMPLANT
PACK SHOULDER (CUSTOM PROCEDURE TRAY) ×3 IMPLANT
PACK UNIVERSAL I (CUSTOM PROCEDURE TRAY) ×3 IMPLANT
PAD ARMBOARD 7.5X6 YLW CONV (MISCELLANEOUS) ×6 IMPLANT
PAD CAST 4YDX4 CTTN HI CHSV (CAST SUPPLIES) ×1 IMPLANT
PADDING CAST COTTON 4X4 STRL (CAST SUPPLIES) ×3
PADDING CAST COTTON 6X4 STRL (CAST SUPPLIES) ×2 IMPLANT
PLATE 11H ELBOW LATERAL RT (Plate) ×2 IMPLANT
PLATE LOCK RT SM (Plate) ×3 IMPLANT
PLATE LOCK RT SM 88X10.9X2.5X9 (Plate) IMPLANT
PLATE OLECRANON SM (Plate) ×2 IMPLANT
SCREW CORT 3.5X26 (Screw) ×3 IMPLANT
SCREW CORT T15 24X3.5XST LCK (Screw) IMPLANT
SCREW CORT T15 26X3.5XST LCK (Screw) IMPLANT
SCREW CORT T15 TPR 55X3.5XST (Screw) IMPLANT
SCREW CORTICAL 3.5MM 18MM (Screw) ×2 IMPLANT
SCREW CORTICAL 3.5X24MM (Screw) ×3 IMPLANT
SCREW CORTICAL 3.5X55MM (Screw) ×3 IMPLANT
SCREW CORTICAL LOW PROF 3.5X20 (Screw) ×4 IMPLANT
SCREW LOCK 3.5X20 DIST TIB (Screw) ×4 IMPLANT
SCREW LOCK 3.5X24 DIST TIB (Screw) ×2 IMPLANT
SCREW LOCK CORT STAR 3.5X12 (Screw) ×2 IMPLANT
SCREW LOCK CORT STAR 3.5X16 (Screw) ×6 IMPLANT
SCREW LOCK CORT STAR 3.5X18 (Screw) ×2 IMPLANT
SCREW LOCK CORT STAR 3.5X20 (Screw) ×4 IMPLANT
SCREW LOCK CORT STAR 3.5X24 (Screw) ×2 IMPLANT
SCREW LOCK CORT STAR 3.5X28 (Screw) ×2 IMPLANT
SCREW LOCK CORT STAR 3.5X32 (Screw) ×2 IMPLANT
SCREW LOCK CORT STAR 3.5X54 (Screw) ×2 IMPLANT
SCREW LOCK CORT STAR 3.5X56 (Screw) ×2 IMPLANT
SCREW LOW PROFILE 18MMX3.5MM (Screw) ×4 IMPLANT
SCREW LOW PROFILE 22MMX3.5MM (Screw) ×6 IMPLANT
SCREW LP 3.5 (Screw) ×2 IMPLANT
SCREW LP 3.5X60MM (Screw) ×2 IMPLANT
SLING ARM FOAM STRAP LRG (SOFTGOODS) ×2 IMPLANT
SLING ARM IMMOBILIZER LRG (SOFTGOODS) ×3 IMPLANT
SPLINT FIBERGLASS 4X30 (CAST SUPPLIES) ×2 IMPLANT
SPONGE LAP 18X18 X RAY DECT (DISPOSABLE) ×10 IMPLANT
STAPLER VISISTAT 35W (STAPLE) IMPLANT
STRIP CLOSURE SKIN 1/2X4 (GAUZE/BANDAGES/DRESSINGS) IMPLANT
SUCTION FRAZIER HANDLE 10FR (MISCELLANEOUS)
SUCTION TUBE FRAZIER 10FR DISP (MISCELLANEOUS) IMPLANT
SUT ETHILON 3 0 PS 1 (SUTURE) ×6 IMPLANT
SUT VIC AB 0 CT1 27 (SUTURE) ×3
SUT VIC AB 0 CT1 27XBRD ANBCTR (SUTURE) ×1 IMPLANT
SUT VIC AB 2-0 CT1 27 (SUTURE) ×3
SUT VIC AB 2-0 CT1 TAPERPNT 27 (SUTURE) ×1 IMPLANT
SYR CONTROL 10ML LL (SYRINGE) IMPLANT
TOWEL OR 17X24 6PK STRL BLUE (TOWEL DISPOSABLE) ×2 IMPLANT
TOWEL OR 17X26 10 PK STRL BLUE (TOWEL DISPOSABLE) ×3 IMPLANT
UNDERPAD 30X30 (UNDERPADS AND DIAPERS) ×3 IMPLANT
WASHER 3.5MM (Orthopedic Implant) ×10 IMPLANT
WATER STERILE IRR 1000ML POUR (IV SOLUTION) ×1 IMPLANT

## 2015-09-24 NOTE — Anesthesia Preprocedure Evaluation (Addendum)
Anesthesia Evaluation  Patient identified by MRN, date of birth, ID band Patient awake    Reviewed: Allergy & Precautions, NPO status , Patient's Chart, lab work & pertinent test results  Airway Mallampati: II  TM Distance: >3 FB Neck ROM: Full    Dental   Pulmonary asthma ,    breath sounds clear to auscultation       Cardiovascular hypertension, +CHF  + dysrhythmias  Rhythm:Irregular Rate:Normal     Neuro/Psych    GI/Hepatic negative GI ROS, Neg liver ROS,   Endo/Other  negative endocrine ROS  Renal/GU negative Renal ROS     Musculoskeletal   Abdominal   Peds  Hematology   Anesthesia Other Findings   Reproductive/Obstetrics                             Anesthesia Physical  Anesthesia Plan  ASA: III  Anesthesia Plan: General   Post-op Pain Management: GA combined w/ Regional for post-op pain   Induction: Intravenous  Airway Management Planned: Oral ETT  Additional Equipment:   Intra-op Plan:   Post-operative Plan: Extubation in OR  Informed Consent: I have reviewed the patients History and Physical, chart, labs and discussed the procedure including the risks, benefits and alternatives for the proposed anesthesia with the patient or authorized representative who has indicated his/her understanding and acceptance.   Dental Advisory Given and Dental advisory given  Plan Discussed with: CRNA and Surgeon  Anesthesia Plan Comments: (  Discussed general anesthesia, including possible nausea, instrumentation of airway, sore throat,pulmonary aspiration, etc. I asked if the were any outstanding questions, or  concerns before we proceeded. )       Anesthesia Quick Evaluation

## 2015-09-24 NOTE — Anesthesia Procedure Notes (Signed)
Procedure Name: Intubation Date/Time: 09/24/2015 12:28 PM Performed by: Nolon Nations Pre-anesthesia Checklist: Patient identified, Emergency Drugs available, Suction available and Patient being monitored Patient Re-evaluated:Patient Re-evaluated prior to inductionOxygen Delivery Method: Circle system utilized Preoxygenation: Pre-oxygenation with 100% oxygen Intubation Type: IV induction Laryngoscope Size: Mac and 3 Grade View: Grade I Tube type: Oral Tube size: 7.0 mm Number of attempts: 1 Placement Confirmation: ETT inserted through vocal cords under direct vision,  positive ETCO2 and breath sounds checked- equal and bilateral Secured at: 23 cm Tube secured with: Tape Dental Injury: Teeth and Oropharynx as per pre-operative assessment  Difficulty Due To: Difficulty was unanticipated Comments: Right lower tooth loose per induction per patient. Tooth extremely loose and moves when touched.  Tooth remains intact after intubation, vss

## 2015-09-24 NOTE — Transfer of Care (Signed)
Immediate Anesthesia Transfer of Care Note  Patient: Cristina [name]  Procedure(s) Performed: Procedure(s): OPEN REDUCTION INTERNAL FIXATION (ORIF) RIGHT DISTAL HUMERUS FRACTURE (Right)  Patient Location: PACU  Anesthesia Type:GA combined with regional for post-op pain  Level of Consciousness: awake, alert  and patient cooperative  Airway & Oxygen Therapy: Patient Spontanous Breathing and Patient connected to nasal cannula oxygen  Post-op Assessment: Report given to RN and Post -op Vital signs reviewed and stable  Post vital signs: Reviewed and stable  Last Vitals:  Vitals:   09/24/15 1210 09/24/15 1706  BP: (!) 157/81 (!) (P) 97/56  Pulse: 85 (P) 99  Resp: 17   Temp:  (P) 36.6 C    Last Pain:  Vitals:   09/24/15 1706  TempSrc:   PainSc: (P) 0-No pain      Patients Stated Pain Goal: 3 ([id number])  Complications: No apparent anesthesia complications

## 2015-09-24 NOTE — Progress Notes (Signed)
     Subjective: Day of Surgery Procedure(s) (LRB): OPEN REDUCTION INTERNAL FIXATION (ORIF) RIGHT DISTAL HUMERUS FRACTURE (Right)  Patient reports pain as moderate.    Objective:   VITALS:  Temp:  [97.8 F (36.6 C)-98.3 F (36.8 C)] 98 F (36.7 C) (09/07 2100) Pulse Rate:  [73-105] 102 (09/07 2100) Resp:  [12-22] 16 (09/07 2100) BP: (92-189)/(52-134) 107/54 (09/07 2100) SpO2:  [91 %-100 %] 97 % (09/07 2100) Weight:  [179 lb (81.2 kg)] 179 lb (81.2 kg) (09/07 0935)  Called by nurse Bp 90s over 27s. Ordered fluid bolus 250 cc over 1 hour,continue with IV rate 125 cc/hr. Check lab CBC and BMET.  LABS  Recent Labs  09/24/15 0930 09/24/15 2055  HGB 12.8 9.7*  WBC 11.5* 12.5*  PLT 445* 382    Recent Labs  09/24/15 0930 09/24/15 2055  NA 144 142  K 3.6 3.5  CL 105 105  CO2 29 31  BUN 16 13  CREATININE 0.73 0.77  GLUCOSE 100* 103*    Recent Labs  09/24/15 0930  INR 1.10     Assessment/Plan: Day of Surgery Procedure(s) (LRB): OPEN REDUCTION INTERNAL FIXATION (ORIF) RIGHT DISTAL HUMERUS FRACTURE (Right)  Low blood pressure post op right humerus ORIF.likely medication and hypovolemia.   Bolus with NS and continue 125 cc/hr Hold antihypertensive agents for low blood pressure.   NITKA,Cristina [name] 09/24/2015, 10:46 PM

## 2015-09-24 NOTE — Op Note (Signed)
Date of Surgery: 09/24/2015  INDICATIONS: Ms. Spinney is a 75 y.o.-year-old female with a right distal humerus fracture;  The patient did consent to the procedure after discussion of the risks and benefits.  PREOPERATIVE DIAGNOSIS: Right intraarticular distal humerus fracture  POSTOPERATIVE DIAGNOSIS: Same.  PROCEDURE:  1. Open treatment internal fixation of right supracondylar humerus fracture with intracondylar extension 2. Olecranon osteotomy and ORIF of olecranon with internal fixation 3. Subcutaneous transposition of ulnar nerve at elbow  SURGEON: N. Eduard Roux, M.D.  ASSIST: Ky Barban, RNFA.  ANESTHESIA:  general, regional  IV FLUIDS AND URINE: See anesthesia.  ESTIMATED BLOOD LOSS: 100 mL.  IMPLANTS: Biomet  DRAINS: none  COMPLICATIONS: None.  DESCRIPTION OF PROCEDURE: The patient was brought to the operating room and placed lateral on the operating table.  The patient had been signed prior to the procedure and this was documented. The patient had the anesthesia placed by the anesthesiologist.  A time-out was performed to confirm that this was the correct patient, site, side and location. The patient did receive antibiotics prior to the incision and was re-dosed during the procedure as needed at indicated intervals.  A tourniquet was placed.  The patient had the operative extremity prepped and draped in the standard surgical fashion.    A posterior incision over the elbow was created. Full-thickness flaps were elevated both radially and ulnarly. The ulnar nerve was identified at the junction between the medial head of the triceps and the medial intermuscular septum. This was then mobilized distally with care through the cubital tunnel and the FCU fascia. Once we had good mobilization of the ulnar nerve we then performed an [employer] olecranon osteotomy.  The triceps was then reflected off of the posterior aspect of the distal humerus. The fracture was then exposed. The  fracture had significant comminution of the distal humerus and the capitellum. There was also bone loss. I first placed a medial plate on the medial aspect of the distal humerus and fixed it to the shaft. I then aligned the medial column to the humeral shaft and affixed this to the plate. Fluoroscopy was used to confirm appropriate placement of the hardware. Once we had provisional fixation we then turned our attention to the lateral column of the distal humerus. Again there was significant comminution in multiple planes. She also had evidence of chondral injury to the trochlea and the capitellum and the radial head. I then obtained a reduction of the lateral column and this was clamped with tenaculum clamps. K wires were used to provisionally fix the fracture. I then placed the lateral plate on the appropriate position. This was confirmed under fluoroscopy. I then placed a series of locking and nonlocking screws in order to fix the rest of the fracture. The K wires were removed the clamp was removed the fracture stay reduced. X-rays were taken to confirm no intra-articular penetration of the hardware. I then performed an open reduction internal fixation of the previous olecranon osteotomy with a posterior plate. I then performed a subcutaneous transposition of the ulnar nerve at the elbow. The wound was then thoroughly irrigated. The tourniquet was deflated. Hemostasis was obtained. Final x-rays were taken. The wound was closed in layer fashion using 0 Vicryl for the fascia and triceps attachment, 2-0 Vicryl for the subcutaneous layer, 3-0 nylon for the skin. Sterile dressings were applied. Arm was immobilized in a posterior long-arm splint. Patient tolerated procedure well had no immediate complications.  POSTOPERATIVE PLAN: She will be nonweightbearing to  the right upper extremity.  Azucena Cecil, MD [place] 4:17 PM

## 2015-09-24 NOTE — Discharge Instructions (Signed)
Postoperative instructions:  Weightbearing: non weight bearing  Dressing instructions: Keep your dressing and/or splint clean and dry at all times.  It will be removed at your first post-operative appointment.  Your stitches and/or staples will be removed at this visit.  Incision instructions:  Do not soak your incision for 3 weeks after surgery.  If the incision gets wet, pat dry and do not scrub the incision.  Pain control:  You have been given a prescription to be taken as directed for post-operative pain control.  In addition, elevate the operative extremity above the heart at all times to prevent swelling and throbbing pain.  Take over-the-counter Colace, 100mg  by mouth twice a day while taking narcotic pain medications to help prevent constipation.  Follow up appointments: 1) 10-14 days for suture removal and wound check. 2) Dr. Erlinda Hong as scheduled.   -------------------------------------------------------------------------------------------------------------  After Surgery Pain Control:  After your surgery, post-surgical discomfort or pain is likely. This discomfort can last several days to a few weeks. At certain times of the day your discomfort may be more intense.  Did you receive a nerve block?  A nerve block can provide pain relief for one hour to two days after your surgery. As long as the nerve block is working, you will experience little or no sensation in the area the surgeon operated on.  As the nerve block wears off, you will begin to experience pain or discomfort. It is very important that you begin taking your prescribed pain medication before the nerve block fully wears off. Treating your pain at the first sign of the block wearing off will ensure your pain is better controlled and more tolerable when full-sensation returns. Do not wait until the pain is intolerable, as the medicine will be less effective. It is better to treat pain in advance than to try and catch up.    General Anesthesia:  If you did not receive a nerve block during your surgery, you will need to start taking your pain medication shortly after your surgery and should continue to do so as prescribed by your surgeon.  Pain Medication:  Most commonly we prescribe Vicodin and Percocet for post-operative pain. Both of these medications contain a combination of acetaminophen (Tylenol) and a narcotic to help control pain.   It takes between 30 and 45 minutes before pain medication starts to work. It is important to take your medication before your pain level gets too intense.   Nausea is a common side effect of many pain medications. You will want to eat something before taking your pain medicine to help prevent nausea.   If you are taking a prescription pain medication that contains acetaminophen, we recommend that you do not take additional over the counter acetaminophen (Tylenol).  Other pain relieving options:   Using a cold pack to ice the affected area a few times a day (15 to 20 minutes at a time) can help to relieve pain, reduce swelling and bruising.   Elevation of the affected area can also help to reduce pain and swelling.

## 2015-09-24 NOTE — H&P (Signed)
PREOPERATIVE H&P  Chief Complaint: right distal humerus fracture  HPI: Cristina [name] is a 75 y.o. female who presents for surgical treatment of right distal humerus fracture.  She denies any changes in medical history.  Past Medical History:  Diagnosis Date  . Anxiety   . Asthma   . Atrial fibrillation ([place])   . Dysrhythmia    PAF  . History of gout   . Hyperlipemia   . Hypertension   . Pneumonia ~ 2012  . Shortness of breath dyspnea    with exertion   Past Surgical History:  Procedure Laterality Date  . ANKLE FRACTURE SURGERY Right 2000s  . BACK SURGERY    . FRACTURE SURGERY    . [place] SURGERY  1990s  . ORIF ANKLE FRACTURE Left 08/19/2015   Procedure: OPEN REDUCTION INTERNAL FIXATION (ORIF) LEFT ANKLE FRACTURE, POSSIBLE SYNDESMOSIS;  Surgeon: Leandrew Koyanagi, MD;  Location: [facility];  Service: Orthopedics;  Laterality: Left;  . TONSILLECTOMY AND ADENOIDECTOMY  1950s   Social History   Social History  . Marital status: Divorced    Spouse name: N/A  . Number of children: N/A  . Years of education: N/A   Occupational History  . Retired    Social History Main Topics  . Smoking status: Never Smoker  . Smokeless tobacco: Never Used  . Alcohol use Yes     Comment: 08/20/2015 "glass of wine maybe once/month"  . Drug use: No  . Sexual activity: Yes   Other Topics Concern  . None   Social History Narrative   Lives in [place] since 1982   Divorced   G6P4   daughters live close   Retired from [employer] smoker/dirnks occ wine         Family History  Problem Relation Age of Onset  . CAD  85    father-mulitple  . Emphysema      mother  . Diabetes Mellitus II      father   Allergies  Allergen Reactions  . Augmentin [Amoxicillin-Pot Clavulanate] Anaphylaxis and Other (See Comments)    Has patient had a PCN reaction causing immediate rash, facial/tongue/throat swelling, SOB or lightheadedness with hypotension: Yes Has patient had a PCN  reaction causing severe rash involving mucus membranes or skin necrosis: No Has patient had a PCN reaction that required hospitalization No Has patient had a PCN reaction occurring within the last 10 years: Yes If all of the above answers are "NO", then may proceed with Cephalosporin use.  [name] Kitchen Lisinopril Anaphylaxis and Cough   Prior to Admission medications   Medication Sig Start Date End Date Taking? Authorizing Provider  ALPRAZolam (XANAX) 0.25 MG tablet Take 0.25 mg by mouth 3 (three) times daily as needed for anxiety.  09/12/15   Historical Provider, MD  atorvastatin (LIPITOR) 20 MG tablet Take 20 mg by mouth at bedtime.     Historical Provider, MD  citalopram (CELEXA) 20 MG tablet Take 20 mg by mouth at bedtime.    Historical Provider, MD  colchicine 0.6 MG tablet Take 0.6 mg by mouth 2 (two) times daily as needed (for gout flares).     Jacolyn Reedy, MD  methocarbamol (ROBAXIN) 750 MG tablet Take 1 tablet (750 mg total) by mouth 2 (two) times daily as needed for muscle spasms. 08/19/15   Leandrew Koyanagi, MD  metoprolol succinate (TOPROL-XL) 100 MG 24 hr tablet Take 100 mg by mouth at bedtime. Take with or  immediately following a meal.    Historical Provider, MD  naproxen sodium (ANAPROX) 220 MG tablet Take 440 mg by mouth 2 (two) times daily as needed (for pain).     Historical Provider, MD  ondansetron (ZOFRAN) 4 MG tablet Take 1-2 tablets (4-8 mg total) by mouth every 8 (eight) hours as needed for nausea or vomiting. 08/19/15   Naiping Ephriam Jenkins, MD  oxyCODONE-acetaminophen (PERCOCET) 5-325 MG tablet Take 1-2 tablets by mouth every 4 (four) hours as needed for severe pain. 09/15/15   Sharlett Iles, MD  pramipexole (MIRAPEX) 1 MG tablet Take 2 mg by mouth at bedtime.     Historical Provider, MD  rivaroxaban (XARELTO) 20 MG TABS tablet Take 1 tablet (20 mg total) by mouth daily with supper. 03/06/15   Lelon Perla, MD  senna-docusate (SENOKOT S) 8.6-50 MG tablet Take 1 tablet by mouth at  bedtime as needed. Patient not taking: Reported on 09/15/2015 08/19/15   Leandrew Koyanagi, MD  verapamil (CALAN-SR) 240 MG CR tablet Take 240 mg by mouth at bedtime.    Historical Provider, MD  Vitamin D, Ergocalciferol, (DRISDOL) 50000 units CAPS capsule Take 50,000 Units by mouth every 7 (seven) days. Pt takes on Tuesday.    Historical Provider, MD     Positive ROS: All other systems have been reviewed and were otherwise negative with the exception of those mentioned in the HPI and as above.  Physical Exam: General: Alert, no acute distress Cardiovascular: No pedal edema Respiratory: No cyanosis, no use of accessory musculature GI: abdomen soft Skin: No lesions in the area of chief complaint Neurologic: Sensation intact distally Psychiatric: Patient is competent for consent with normal mood and affect Lymphatic: no lymphedema  MUSCULOSKELETAL: exam stable  Assessment: right distal humerus fracture  Plan: Plan for Procedure(s): OPEN REDUCTION INTERNAL FIXATION (ORIF) RIGHT DISTAL HUMERUS FRACTURE  The risks benefits and alternatives were discussed with the patient including but not limited to the risks of nonoperative treatment, versus surgical intervention including infection, bleeding, nerve injury,  blood clots, cardiopulmonary complications, morbidity, mortality, among others, and they were willing to proceed.   Marianna Payment, MD   09/24/2015 9:14 AM

## 2015-09-24 NOTE — Progress Notes (Signed)
[facility] on call notified of pt hypotension vital signs charted in epic. New orders received. Arthor Captain LPN

## 2015-09-24 NOTE — Anesthesia Procedure Notes (Signed)
Anesthesia Regional Block:  Supraclavicular block  Pre-Anesthetic Checklist: ,, timeout performed, Correct Patient, Correct Site, Correct Laterality, Correct Procedure, Correct Position,,,,, at surgeon's request and post-op pain management  Laterality: Right  Prep: chloraprep       Needles:   Needle Type: Echogenic Stimulator Needle     Needle Length: 9cm 9 cm Needle Gauge: 21 and 21 G    Additional Needles: Supraclavicular block Narrative:  Injection made incrementally with aspirations every 5 mL.  Events: blood aspirated Anesthesiologist: Lyndle Herrlich  Additional Notes: .5% Marcaine 25cc

## 2015-09-25 ENCOUNTER — Encounter (HOSPITAL_COMMUNITY): Payer: Self-pay | Admitting: Orthopaedic Surgery

## 2015-09-25 LAB — CBC
HEMATOCRIT: 30.9 % — AB (ref 36.0–46.0)
HEMOGLOBIN: 9.2 g/dL — AB (ref 12.0–15.0)
MCH: 27.2 pg (ref 26.0–34.0)
MCHC: 29.8 g/dL — ABNORMAL LOW (ref 30.0–36.0)
MCV: 91.4 fL (ref 78.0–100.0)
Platelets: 357 10*3/uL (ref 150–400)
RBC: 3.38 MIL/uL — AB (ref 3.87–5.11)
RDW: 15 % (ref 11.5–15.5)
WBC: 8.9 10*3/uL (ref 4.0–10.5)

## 2015-09-25 LAB — BASIC METABOLIC PANEL
ANION GAP: 6 (ref 5–15)
BUN: 12 mg/dL (ref 6–20)
CHLORIDE: 105 mmol/L (ref 101–111)
CO2: 30 mmol/L (ref 22–32)
Calcium: 8.3 mg/dL — ABNORMAL LOW (ref 8.9–10.3)
Creatinine, Ser: 0.79 mg/dL (ref 0.44–1.00)
GFR calc Af Amer: >60 mL/min (ref >60)
GLUCOSE: 99 mg/dL (ref 65–99)
POTASSIUM: 3.3 mmol/L — AB (ref 3.5–5.1)
Sodium: 141 mmol/L (ref 135–145)

## 2015-09-25 MED ORDER — FUROSEMIDE 10 MG/ML IJ SOLN
10.0000 mg | Freq: Once | INTRAMUSCULAR | Status: AC
  Administered 2015-09-25: 10 mg via INTRAVENOUS

## 2015-09-25 MED ORDER — FUROSEMIDE 10 MG/ML IJ SOLN
INTRAMUSCULAR | Status: AC
  Filled 2015-09-25: qty 2

## 2015-09-25 MED ORDER — POTASSIUM CHLORIDE 20 MEQ/15ML (10%) PO SOLN
40.0000 meq | Freq: Two times a day (BID) | ORAL | Status: AC
  Administered 2015-09-25 (×2): 40 meq via ORAL
  Filled 2015-09-25 (×2): qty 30

## 2015-09-25 NOTE — Progress Notes (Signed)
MD on call informed of patient low output vital signs charted in epic lungs clear no crackles heard no edema. New order received will continue to monitor. Arthor Captain LPN

## 2015-09-25 NOTE — Progress Notes (Signed)
   Subjective:  Patient reports pain as moderate.  No events.  Objective:   VITALS:   Vitals:   09/24/15 2100 09/24/15 2351 09/25/15 0302 09/25/15 0455  BP: (!) 107/54 114/60 113/65 116/62  Pulse: (!) 102 89 93 91  Resp: 16     Temp: 98 F (36.7 C) 98.4 F (36.9 C) 97.9 F (36.6 C) 97.9 F (36.6 C)  TempSrc: Oral Oral Oral Oral  SpO2: 97% 97%  96%  Weight:        Splint in place Fingers wwp, swollen NVI  Lab Results  Component Value Date   WBC 8.9 09/25/2015   HGB 9.2 (L) 09/25/2015   HCT 30.9 (L) 09/25/2015   MCV 91.4 09/25/2015   PLT 357 09/25/2015     Assessment/Plan:  1 Day Post-Op   - Expected postop acute blood loss anemia - will monitor for symptoms - Up with PT/OT - NWB RUE and RLE - likely needs SNF  Marianna Payment 09/25/2015, 7:17 AM [phone]

## 2015-09-25 NOTE — Evaluation (Signed)
Physical Therapy Evaluation Patient Details Name: Cristina [name] MRN: [id number] DOB: 1940/05/01 Today's Date: 09/25/2015   History of Present Illness  Pt admitted for ORIF of R elbow. Pt sustained a fx in a fall on 09/15/15. PMH: recent L ankle fx with ORIF with ongoing NWB status, pt had been at [facility] for rehab and discharged home with daughter, afib, asthma, anxiety, CHF, HTN, back surgery.   Clinical Impression  At this time the pt is requiring +2 assistance for bed mobility and transfers. Able to complete stand-pivot transfer from bed to chair with +2 max assistance. Based upon the patient's mobility level at this time, recommending SNF for further rehabilitation.     Follow Up Recommendations SNF    Equipment Recommendations   (to be assessed at next venue)    Recommendations for Other Services       Precautions / Restrictions Precautions Precautions: Fall Required Braces or Orthoses: Sling Restrictions Weight Bearing Restrictions: Yes RUE Weight Bearing: Non weight bearing LLE Weight Bearing: Non weight bearing      Mobility  Bed Mobility Overal bed mobility: +2 for physical assistance;Needs Assistance Bed Mobility: Supine to Sit     Supine to sit: +2 for physical assistance;Min assist     General bed mobility comments: assist with trunk and scooting pelvis to EOB.   Transfers Overall transfer level: Needs assistance Equipment used: None Transfers: Sit to/from [facility] Sit to Stand: +2 physical assistance;Max assist Stand pivot transfers: +2 physical assistance;Max assist       General transfer comment: bed to chair, pt unable to maintain full NWB through LLE during transfer and poor ability to pivot on Rt LE to chair.   Ambulation/Gait                Stairs            Wheelchair Mobility    Modified Rankin (Stroke Patients Only)       Balance Overall balance assessment: Needs assistance Sitting-balance support:  Feet supported Sitting balance-Leahy Scale: Fair     Standing balance support: Single extremity supported Standing balance-Leahy Scale: Poor Standing balance comment: requiring physical assistance to maintain balance.                              Pertinent Vitals/Pain Pain Assessment: 0-10 Pain Score: 6  Pain Location: Rt arm Pain Descriptors / Indicators: Aching Pain Intervention(s): Limited activity within patient's tolerance;Monitored during session;Repositioned    Home Living Family/patient expects to be discharged to:: Private residence Living Arrangements: Children Available Help at Discharge: Family;Available 24 hours/day Type of Home: House Home Access: Stairs to enter Entrance Stairs-Rails: None Entrance Stairs-Number of Steps: 3 Home Layout: One level Home Equipment: Bedside commode;Walker - 2 wheels;Cane - single point;Wheelchair - manual Additional Comments: Pt has been staying with daughter and her family. Family reports that they have not had to perform stairs to enter or exit home.     Prior Function Level of Independence: Needs assistance   Gait / Transfers Assistance Needed:  1 person assist for stand pivot transfers, non ambulatory-using w/c. Family reports that the patient has been minimally active.  ADL's / Homemaking Assistance Needed: dependent with dressing and bathing        Hand Dominance   Dominant Hand: Right    Extremity/Trunk Assessment   Upper Extremity Assessment: Defer to OT evaluation RUE Deficits / Details: splinted MPs>proximal to elbow, edematous hand with  minimal finger AROM RUE: Unable to fully assess due to immobilization       Lower Extremity Assessment: Generalized weakness;LLE deficits/detail   LLE Deficits / Details: able to actively move Lt ankle into dorsiflexion/plantarflexion with limited range.      Communication   Communication: No difficulties  Cognition Arousal/Alertness: Awake/alert Behavior  During Therapy: WFL for tasks assessed/performed Overall Cognitive Status: History of cognitive impairments - at baseline       Memory: Decreased short-term memory              General Comments      Exercises Other Exercises Other Exercises: retrograde massage, A/PROM R hand      Assessment/Plan    PT Assessment Patient needs continued PT services  PT Diagnosis Difficulty walking   PT Problem List Decreased strength;Decreased activity tolerance;Decreased balance;Decreased mobility;Decreased range of motion  PT Treatment Interventions DME instruction;Functional mobility training;Therapeutic activities;Therapeutic exercise;Patient/family education;Wheelchair mobility training   PT Goals (Current goals can be found in the Care Plan section) Acute Rehab PT Goals Patient Stated Goal: be able to move better PT Goal Formulation: With patient Time For Goal Achievement: 10/09/15 Potential to Achieve Goals: Fair    Frequency Min 3X/week   Barriers to discharge        Co-evaluation PT/OT/SLP Co-Evaluation/Treatment: Yes Reason for Co-Treatment: For patient/therapist safety PT goals addressed during session: Mobility/safety with mobility OT goals addressed during session: ADL's and self-care       End of Session Equipment Utilized During Treatment: Gait belt;Other (comment) (sling Rt UE) Activity Tolerance: Patient tolerated treatment well Patient left: in chair;with call bell/phone within reach;with family/visitor present Nurse Communication: Mobility status;Weight bearing status    Functional Assessment Tool Used: clinical judgment Functional Limitation: Mobility: Walking and moving around Mobility: Walking and Moving Around Current Status [id number]): At least 60 percent but less than 80 percent impaired, limited or restricted Mobility: Walking and Moving Around Goal Status [phone]): At least 40 percent but less than 60 percent impaired, limited or restricted    Time:  [id number] PT Time Calculation (min) (ACUTE ONLY): 40 min   Charges:   PT Evaluation $PT Eval Moderate Complexity: 1 Procedure     PT G Codes:   PT G-Codes **NOT FOR INPATIENT CLASS** Functional Assessment Tool Used: clinical judgment Functional Limitation: Mobility: Walking and moving around Mobility: Walking and Moving Around Current Status [id number]): At least 60 percent but less than 80 percent impaired, limited or restricted Mobility: Walking and Moving Around Goal Status [phone]): At least 40 percent but less than 60 percent impaired, limited or restricted    Cassell Clement, PT, CSCS Pager [phone] Office [phone]  09/25/2015, 2:07 PM

## 2015-09-25 NOTE — Progress Notes (Signed)
CMT called stated that pt heart rate was 140 ,150's and sustaining I went to assess the patient and she was on [facility]. Vital taken bp145/107 HR 129 2200 Hypertensive medications given and bp rechecked 1hr later 133/82 HR 93. Pt was asymptomatic during this event. Will continue to monitor. Arthor Captain LPN

## 2015-09-25 NOTE — Evaluation (Signed)
Occupational Therapy Evaluation Patient Details Name: Cristina [name] MRN: [id number] DOB: 1940/08/16 Today's Date: 09/25/2015    History of Present Illness Pt admitted for ORIF of R elbow. Pt sustained a fx in a fall on 09/15/15. PMH: recent L ankle fx with ORIF with ongoing NWB status, pt had been at [facility] for rehab and discharged home with daughter, afib, asthma, anxiety, CHF, HTN, back surgery.    Clinical Impression   Pt was dependent in all ADL with the exception of self feeding since her fall resulting in L ankle fx. She was performing stand pivot transfers with assist of her daughter as she was NWB. Pt currently needs 2 person assist for transfers and demonstrates poor activity tolerance. Will follow acutely. Recommending SNF.   Follow Up Recommendations  SNF;Supervision/Assistance - 24 hour    Equipment Recommendations   (hospital bed)    Recommendations for Other Services       Precautions / Restrictions Precautions Precautions: Fall Required Braces or Orthoses: Sling Restrictions Weight Bearing Restrictions: Yes RUE Weight Bearing: Non weight bearing LLE Weight Bearing: Non weight bearing      Mobility Bed Mobility Overal bed mobility: +2 for physical assistance;Needs Assistance Bed Mobility: Supine to Sit     Supine to sit: +2 for physical assistance;Min assist     General bed mobility comments: toward L side of bed, assist for trunk and to shift hips to EOB, guidance for LEs, verbal cues for technique and increased time   Transfers Overall transfer level: Needs assistance   Transfers: Sit to/from Stand;Stand Pivot Transfers Sit to Stand: +2 physical assistance;Max assist Stand pivot transfers: +2 physical assistance;Max assist       General transfer comment: bed>chair toward R side, flexed posture, assist to rise and pivot, pt with decreased ability to maintain NWB on L LE     Balance Overall balance assessment: Needs assistance Sitting-balance  support: Feet supported Sitting balance-Leahy Scale: Fair       Standing balance-Leahy Scale: Poor                              ADL Overall ADL's : Needs assistance/impaired Eating/Feeding: Set up;Sitting Eating/Feeding Details (indicate cue type and reason): with L hand Grooming: Wash/dry face;Wash/dry hands;Sitting;Set up                                 General ADL Comments: total assist for bathing, dressing and toileting     Vision     Perception     Praxis      Pertinent Vitals/Pain Pain Assessment: 0-10 Pain Score: 6  Pain Location: R UE Pain Descriptors / Indicators: Aching;Guarding Pain Intervention(s): Premedicated before session;Monitored during session;Repositioned     Hand Dominance Right   Extremity/Trunk Assessment Upper Extremity Assessment Upper Extremity Assessment: RUE deficits/detail RUE Deficits / Details: splinted MPs>proximal to elbow, edematous hand with minimal finger AROM RUE: Unable to fully assess due to immobilization RUE Coordination: decreased fine motor;decreased gross motor   Lower Extremity Assessment Lower Extremity Assessment: Defer to PT evaluation       Communication Communication Communication: No difficulties   Cognition Arousal/Alertness: Awake/alert Behavior During Therapy: WFL for tasks assessed/performed Overall Cognitive Status: History of cognitive impairments - at baseline       Memory: Decreased short-term memory             General Comments  Exercises Exercises: Other exercises Other Exercises Other Exercises: retrograde massage, A/PROM R hand   Shoulder Instructions      Home Living Family/patient expects to be discharged to:: Private residence Living Arrangements: Children (was staying with daughter Karena Addison and her husband) Available Help at Discharge: Family;Available 24 hours/day Type of Home: House Home Access: Stairs to enter [employer] of Steps:  3   Home Layout: One level     Bathroom Shower/Tub: Teacher, early years/pre: Standard Bathroom Accessibility: No   Home Equipment: Bedside commode;Walker - 2 wheels;Cane - single point;Wheelchair - manual   Additional Comments: family was bumping pt down stairs in w/c, primarily only stand and pivoting      Prior Functioning/Environment Level of Independence: Needs assistance  Gait / Transfers Assistance Needed:  1 person assist for stand pivot transfers, non ambulatory ADL's / Homemaking Assistance Needed: dependent in all ADL with the exception of eating        OT Diagnosis: Generalized weakness;Cognitive deficits;Acute pain   OT Problem List: Decreased strength;Decreased range of motion;Decreased activity tolerance;Impaired balance (sitting and/or standing);Decreased coordination;Decreased cognition;Decreased knowledge of precautions;Obesity;Pain;Impaired UE functional use;Increased edema   OT Treatment/Interventions: Self-care/ADL training;Therapeutic exercise;Patient/family education;Therapeutic activities    OT Goals(Current goals can be found in the care plan section) Acute Rehab OT Goals Patient Stated Goal: to not burden her children with her care OT Goal Formulation: With patient Time For Goal Achievement: 10/09/15 Potential to Achieve Goals: Fair ADL Goals Pt Will Perform Grooming: with set-up;sitting Pt Will Transfer to Toilet: with mod assist;stand pivot transfer;bedside commode Pt/caregiver will Perform Home Exercise Program: Right Upper extremity;Independently (ROM R digits, R shoulder, AAROM) Additional ADL Goal #1: Caregiver will be independent in edema managment.  OT Frequency: Min 2X/week   Barriers to D/C:            Co-evaluation PT/OT/SLP Co-Evaluation/Treatment: Yes Reason for Co-Treatment: For patient/therapist safety   OT goals addressed during session: ADL's and self-care      End of Session Equipment Utilized During Treatment:  Gait belt;Oxygen Nurse Communication: Mobility status (HR)  Activity Tolerance: Treatment limited secondary to medical complications (Comment) (HR to mid 130's with minimal exertion, 02 to 87% on RA) Patient left: in chair;with call bell/phone within reach;with family/visitor present   Time: [id number] OT Time Calculation (min): 40 min Charges:  OT General Charges $OT Visit: 1 Procedure OT Evaluation $OT Eval Moderate Complexity: 1 Procedure G-Codes: OT G-codes **NOT FOR INPATIENT CLASS** Functional Assessment Tool Used: clinical judgement Functional Limitation: Self care Self Care Current Status [id number]): At least 80 percent but less than 100 percent impaired, limited or restricted Self Care Discharge Status [phone]): At least 60 percent but less than 80 percent impaired, limited or restricted  Malka So 09/25/2015, 12:17 PM  [phone]

## 2015-09-25 NOTE — Anesthesia Postprocedure Evaluation (Signed)
Anesthesia Post Note  Patient: Cristina [name]  Procedure(s) Performed: Procedure(s) (LRB): OPEN REDUCTION INTERNAL FIXATION (ORIF) RIGHT DISTAL HUMERUS FRACTURE (Right)  Patient location during evaluation: PACU Anesthesia Type: General and Regional Level of consciousness: awake and alert Pain management: pain level controlled Vital Signs Assessment: post-procedure vital signs reviewed and stable Respiratory status: spontaneous breathing, nonlabored ventilation, respiratory function stable and patient connected to nasal cannula oxygen Cardiovascular status: blood pressure returned to baseline and stable Postop Assessment: no signs of nausea or vomiting Anesthetic complications: no    Last Vitals:  Vitals:   09/25/15 0455 09/25/15 1247  BP: 116/62 123/73  Pulse: 91 (!) 120  Resp:    Temp: 36.6 C 37.1 C    Last Pain:  Vitals:   09/25/15 1247  TempSrc: Oral  PainSc:                  Datha Kissinger S

## 2015-09-25 NOTE — Progress Notes (Signed)
PT recommending SNF.  Appreciate assistance from SW with this. May dc to SNF when bed available.  Patient in agreement  N. Eduard Roux, MD [place] 4:53 PM

## 2015-09-26 NOTE — Progress Notes (Signed)
Met with pt and daughter again. Daughter wants to use [facility] for [facility] PT and the aide. Contacted Butch Penny at [facility] for referral. Adalberto Ill at [facility] to cancel [facility] referral.

## 2015-09-26 NOTE — Clinical Social Work Note (Signed)
CSW spoke with RN at [facility] and informed that pt has already maxed out number of days insurance will pay for SNF rehab, and that pt will have to self-pay for rehab. Pt and family unable to self-pay. CSW spoke with case manager and inquired about pt discharging home with home health as the pt has a daughter who can provide around the clock care with the help of an nurse aid/CNA. Case management agreed to assist the family with discharging the pt home with home health and receiving PT at home. MD also notified of pt needing home health instead of SNF.

## 2015-09-26 NOTE — Care Management Note (Signed)
Case Management Note  Patient Details  Name: Cristina [name] MRN: [id number] Date of Birth: 04/06/1940  Subjective/Objective:  75 yo F fell and sustained a R elbow fx, s/p ORIF of R elbow. She recently sustained a L ankle fx on 08/19/15 and underwent ORIF. She was D/C to [facility] for rehab.          Action/Plan:   Expected Discharge Date:   09/27/15               Expected Discharge Plan:  [place]  In-House Referral:  Clinical Social Work  Discharge planning Services  CM Consult  Post Acute Care Choice:  Home Health Choice offered to:  Adult Children  DME Arranged:    DME Agency:     HH Arranged:  PT, Nurse's Aide [place] Agency:  [place]  Status of Service:  In process, will continue to follow  If discussed at Long Length of Stay Meetings, dates discussed:    Additional Comments: met with pt and daughters. PT is recommending SNF, but they can't afford to pay the the co-payment. Daughter plans to take her home. Discussed with them that pt requires a lot of assistance. Her daughter verbalized understanding and she reports that she has been doing it and her daughter helps her. Discussed DME. Pt has a W/C, rolling waker, and a 3-in-1 BSC. Discussed a hospital bed. Her daughter asked about the rental fee for the hospital bed. Contacted Jamine at [facility] and he reports that the rental fee is $165.00 / month. Daughter stated that $58 / month is too much money and her mother has been using her granddaughter's bed and she is fine. Provided daughter with a list of [employer] agencies. She prefers to use [facility]. Contacted Dorina Hoyer for [facility] PT and aide referral.  Norina Buzzard, RN 09/26/2015, 11:33 AM

## 2015-09-26 NOTE — Care Management Note (Deleted)
Case Management Note  Patient Details  Name: Cristina [name] MRN: [id number] Date of Birth: [birth year]  Subjective/Objective:                    Action/Plan:   Expected Discharge Date:                  Expected Discharge Plan:  [place]  In-House Referral:  Clinical Social Work  Discharge planning Services  CM Consult  Post Acute Care Choice:  Home Health Choice offered to:  Adult Children  DME Arranged:    DME Agency:     HH Arranged:  PT, Nurse's Aide [place] Agency:  [place]  Status of Service:  In process, will continue to follow  If discussed at Long Length of Stay Meetings, dates discussed:    Additional Comments:  Norina Buzzard, RN 09/26/2015, 12:52 PM

## 2015-09-26 NOTE — Clinical Social Work Note (Signed)
Clinical Social Work Assessment  Patient Details  Name: Cristina [name] MRN: [id number] Date of Birth: August 11, 1940  Date of referral:  09/26/15               Reason for consult:  Discharge Planning                Permission sought to share information with:  Case Manager, Facility Sport and exercise psychologist, Family Supports Permission granted to share information::  Yes, Verbal Permission Granted  Name::        Agency::   (SNF)  Relationship::     Contact Information:     Housing/Transportation Living arrangements for the past 2 months:  Single Family Home Source of Information:  Patient, Medical Team Patient Interpreter Needed:  None Criminal Activity/Legal Involvement Pertinent to Current Situation/Hospitalization:  No - Comment as needed Significant Relationships:  Adult Children Lives with:  Self Do you feel safe going back to the place where you live?  No Need for family participation in patient care:  Yes (Comment)  Care giving concerns: Pt and daughter expressed concerns regarding insurance covering the cost for short-term rehab. Both pt and daughter stated that the pt was informed by [facility] that her insurance ran out and would not cover SNF rehab. Both pt and daughter agreeable to SNF for higher level of care.    Social Worker assessment / plan: Holiday representative met with pt and discussed CSW role with discharge planning. CSW also explained PT recommendation for SNF. Pt and daughter stated they would like [facility] as first choice for SNF. Pt also agreeable to SNF in [facility].   CSW will follow up with bed offers once available.   Employment status:  Retired Forensic scientist:  Other (Comment Required) ([place]) PT Recommendations:  [place] / Referral to community resources:  [place]  Patient/Family's Response to care: Pt pleasant and lying in bed with daughter at bedside. Pt appears to be happy with care  she is receiving at [facility]. Pt also appreciative for visit from Education officer, museum .   Patient/Family's Understanding of and Emotional Response to Diagnosis, Current Treatment, and Prognosis: Pt and family appear to have a good understanding of reason for pt admission into the hospital and pt care plan.   Emotional Assessment Appearance:  Appears stated age, Well-Groomed Attitude/Demeanor/Rapport:   (Pleasant) Affect (typically observed):  Accepting, Appropriate, Pleasant, Hopeful Orientation:  Oriented to Self, Oriented to Place, Oriented to  Time, Oriented to Situation Alcohol / Substance use:  Not Applicable Psych involvement (Current and /or in the community):  No (Comment)  Discharge Needs  Concerns to be addressed:  Discharge Planning Concerns Readmission within the last 30 days:  No Current discharge risk:  None Barriers to Discharge:  Continued Medical Work up   [employer], LCSW 09/26/2015, 10:33 AM

## 2015-09-26 NOTE — Progress Notes (Signed)
Orthopedic Tech Progress Note Patient Details:  MAKALYA AMPARO 1940/07/16 [id number] Dont think patient will be able to use [facility] because of surgery to arm. Patient ID: MILISA LUU, female   DOB: September 22, 1940, 75 y.o.   MRN: [id number]   Charlott Rakes 09/26/2015, 9:14 AM

## 2015-09-26 NOTE — Discharge Summary (Signed)
Physician Discharge Summary      Patient ID: Cristina [name] MRN: [id number] DOB/AGE: 1940/09/02 75 y.o.  Admit date: 09/24/2015 Discharge date: 09/26/2015  Admission Diagnoses:  <principal problem not specified>  Discharge Diagnoses:  Active Problems:   S/P ORIF (open reduction internal fixation) fracture   Past Medical History:  Diagnosis Date  . Anxiety   . Asthma   . Atrial fibrillation ([place])   . Dysrhythmia    PAF  . History of gout   . Hyperlipemia   . Hypertension   . Pneumonia ~ 2012  . Shortness of breath dyspnea    with exertion    Surgeries: Procedure(s): OPEN REDUCTION INTERNAL FIXATION (ORIF) RIGHT DISTAL HUMERUS FRACTURE on 09/24/2015   Consultants (if any):   Discharged Condition: Improved  Hospital Course: Cristina [name] is an 75 y.o. female who was admitted 09/24/2015 with a diagnosis of <principal problem not specified> and went to the operating room on 09/24/2015 and underwent the above named procedures.    She was given perioperative antibiotics:  Anti-infectives    Start     Dose/Rate Route Frequency Ordered Stop   09/25/15 0000  vancomycin (VANCOCIN) IVPB 1000 mg/200 mL premix     1,000 mg 200 mL/hr over 60 Minutes Intravenous Every 12 hours 09/24/15 1851 09/25/15 1459   09/24/15 1015  vancomycin (VANCOCIN) IVPB 1000 mg/200 mL premix     1,000 mg 200 mL/hr over 60 Minutes Intravenous  Once 09/24/15 1004 09/24/15 1155   09/24/15 1006  vancomycin (VANCOCIN) 1-5 GM/200ML-% IVPB    Comments:  Cristina [name]   : cabinet override      09/24/15 1006 09/24/15 2214    .  She was given sequential compression devices, early ambulation, and xareto for DVT prophylaxis.  She benefited maximally from the hospital stay and there were no complications.    Recent vital signs:  Vitals:   09/26/15 0506 09/26/15 1300  BP: 133/83 115/63  Pulse: 66 79  Resp: 18 18  Temp: 98.6 F (37 C) 98.9 F (37.2 C)    Recent laboratory studies:  Lab Results    Component Value Date   HGB 9.2 (L) 09/25/2015   HGB 9.7 (L) 09/24/2015   HGB 12.8 09/24/2015   Lab Results  Component Value Date   WBC 8.9 09/25/2015   PLT 357 09/25/2015   Lab Results  Component Value Date   INR 1.10 09/24/2015   Lab Results  Component Value Date   NA 141 09/25/2015   K 3.3 (L) 09/25/2015   CL 105 09/25/2015   CO2 30 09/25/2015   BUN 12 09/25/2015   CREATININE 0.79 09/25/2015   GLUCOSE 99 09/25/2015    Discharge Medications:     Medication List    TAKE these medications   ALPRAZolam 0.25 MG tablet Commonly known as:  XANAX Take 0.25 mg by mouth 3 (three) times daily as needed for anxiety.   atorvastatin 20 MG tablet Commonly known as:  LIPITOR Take 20 mg by mouth at bedtime.   citalopram 20 MG tablet Commonly known as:  CELEXA Take 20 mg by mouth at bedtime.   colchicine 0.6 MG tablet Take 0.6 mg by mouth 2 (two) times daily as needed (for gout flares).   methocarbamol 750 MG tablet Commonly known as:  ROBAXIN Take 1 tablet (750 mg total) by mouth 2 (two) times daily as needed for muscle spasms.   metoprolol succinate 100 MG 24 hr tablet Commonly known as:  TOPROL-XL Take  100 mg by mouth at bedtime. Take with or immediately following a meal.   naproxen sodium 220 MG tablet Commonly known as:  ANAPROX Take 440 mg by mouth 2 (two) times daily as needed (for pain).   ondansetron 4 MG tablet Commonly known as:  ZOFRAN Take 1-2 tablets (4-8 mg total) by mouth every 8 (eight) hours as needed for nausea or vomiting.   oxyCODONE 5 MG immediate release tablet Commonly known as:  Oxy IR/ROXICODONE Take 1-3 tablets (5-15 mg total) by mouth every 4 (four) hours as needed.   oxyCODONE 10 mg 12 hr tablet Commonly known as:  OXYCONTIN Take 1 tablet (10 mg total) by mouth every 12 (twelve) hours.   oxyCODONE-acetaminophen 5-325 MG tablet Commonly known as:  PERCOCET Take 1-2 tablets by mouth every 4 (four) hours as needed for severe pain.    pramipexole 1 MG tablet Commonly known as:  MIRAPEX Take 2 mg by mouth at bedtime.   rivaroxaban 20 MG Tabs tablet Commonly known as:  XARELTO Take 1 tablet (20 mg total) by mouth daily with supper.   senna-docusate 8.6-50 MG tablet Commonly known as:  SENOKOT S Take 1 tablet by mouth at bedtime as needed.   verapamil 240 MG CR tablet Commonly known as:  CALAN-SR Take 240 mg by mouth at bedtime.   Vitamin D (Ergocalciferol) 50000 units Caps capsule Commonly known as:  DRISDOL Take 50,000 Units by mouth every 7 (seven) days. Pt takes on Tuesday.       Diagnostic Studies: Dg Elbow 2 Views Right  Result Date: 09/24/2015 CLINICAL DATA:  ORIF. EXAM: RIGHT ELBOW - 2 VIEW; DG C-ARM GT 120 MIN COMPARISON:  CT 09/15/2015. FINDINGS: ORIF distal humeral fracture. Hardware intact. Anatomic alignment. ORIF proximal ulna . Hardware intact. Anatomic alignment. Two images obtained. 0 minutes 23 seconds fluoroscopy time. IMPRESSION: Postsurgical changes about the right elbow . Electronically Signed   By: Cristina [name]  Register   On: 09/24/2015 16:19   Dg Elbow 2 Views Right  Result Date: 09/15/2015 CLINICAL DATA:  Right elbow pain and bruising posteriorly after a fall today. EXAM: RIGHT ELBOW - 2 VIEW COMPARISON:  None. FINDINGS: Oblique comminuted fracture demonstrated along the distal humeral shaft and extending to the intercondylar region. Fracture lines extend to the medial and lateral condyles. There is impaction and displacement of the distal fracture fragments. No definite dislocation of the elbow joint although limited positioning is available. Moderate size right elbow effusion. IMPRESSION: Comminuted and displaced impacted fractures of the distal right humeral shaft extending into the intercondylar region, involving both medial and lateral condyles. Associated effusion. Electronically Signed   By: Cristina [name] M.D.   On: 09/15/2015 19:20   Dg Forearm Right  Result Date: 09/15/2015 CLINICAL  DATA:  Fall.  Elbow pain. EXAM: RIGHT FOREARM - 2 VIEW COMPARISON:  None. FINDINGS: Acute oblique fracture of the distal humerus. 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 No forearm fracture is seen. IMPRESSION: 1. Distal humeral fracture. 2. Madelung deformity of the distal radius/ulna. Electronically Signed   By: Van Clines M.D.   On: 09/15/2015 21:38   Ct Elbow Right Wo Contrast  Result Date: 09/16/2015 CLINICAL DATA:  Patient fell wall today at home. Right elbow fractures. EXAM: CT OF THE RIGHT ELBOW WITHOUT CONTRAST; 3-DIMENSIONAL CT IMAGE RENDERING ON ACQUISITION WORKSTATION TECHNIQUE: Multidetector CT imaging was performed according to the standard protocol. Multiplanar CT image reconstructions were also generated. COMPARISON:  Right elbow radiographs 09/15/2015 FINDINGS:  Comminuted fractures demonstrated of the distal right humerus. Oblique fracture line begins in the distal shaft and extends towards the intercondylar region. There is mild posterior displacement and overlap of the distal fracture fragments. Transverse fracture lines extend to the medial and lateral epicondyles. Condylar fragments are displaced and distracted. Impaction of the condylar fragments into the distal humerus. The ulnar and radial joints appear preserved without evidence of dislocation at the joints. Fracture lines do not appear to extend to the articular surfaces. Soft tissue swelling and effusion are present. Incidental note of a fracture of the lateral right ribs which could be acute. IMPRESSION: Comminuted fractures of the distal right humerus involving the distal shaft and intercondylar regions. Impaction and distraction of the condylar fragments. Posterior angulation and displacement of the distal shaft fragment. Fracture lines do not appear to be intraarticular and there is no evidence of dislocation of the joint. Incidental note of a right lateral rib fracture which could be  acute. Electronically Signed   By: Cristina [name] M.D.   On: 09/16/2015 01:02   Ct 3d Recon At Scanner  Result Date: 09/16/2015 CLINICAL DATA:  Patient fell wall today at home. Right elbow fractures. EXAM: CT OF THE RIGHT ELBOW WITHOUT CONTRAST; 3-DIMENSIONAL CT IMAGE RENDERING ON ACQUISITION WORKSTATION TECHNIQUE: Multidetector CT imaging was performed according to the standard protocol. Multiplanar CT image reconstructions were also generated. COMPARISON:  Right elbow radiographs 09/15/2015 FINDINGS: Comminuted fractures demonstrated of the distal right humerus. Oblique fracture line begins in the distal shaft and extends towards the intercondylar region. There is mild posterior displacement and overlap of the distal fracture fragments. Transverse fracture lines extend to the medial and lateral epicondyles. Condylar fragments are displaced and distracted. Impaction of the condylar fragments into the distal humerus. The ulnar and radial joints appear preserved without evidence of dislocation at the joints. Fracture lines do not appear to extend to the articular surfaces. Soft tissue swelling and effusion are present. Incidental note of a fracture of the lateral right ribs which could be acute. IMPRESSION: Comminuted fractures of the distal right humerus involving the distal shaft and intercondylar regions. Impaction and distraction of the condylar fragments. Posterior angulation and displacement of the distal shaft fragment. Fracture lines do not appear to be intraarticular and there is no evidence of dislocation of the joint. Incidental note of a right lateral rib fracture which could be acute. Electronically Signed   By: Cristina [name] M.D.   On: 09/16/2015 01:02   Dg Humerus Right  Result Date: 09/15/2015 CLINICAL DATA:  Pain after trauma EXAM: RIGHT HUMERUS - 2+ VIEW COMPARISON:  None. FINDINGS: The distal humeral fracture is again evident, extending into the intercondylar region. This is seen to  better advantage on the accompanying elbow radiographs. The remainder of the humerus is intact. IMPRESSION: Distal humeral fracture with mild apex -anterior angulation. Remainder of the humerus is intact. Electronically Signed   By: Andreas Newport M.D.   On: 09/15/2015 21:44   Dg C-arm Gt 120 Min  Result Date: 09/24/2015 CLINICAL DATA:  ORIF. EXAM: RIGHT ELBOW - 2 VIEW; DG C-ARM GT 120 MIN COMPARISON:  CT 09/15/2015. FINDINGS: ORIF distal humeral fracture. Hardware intact. Anatomic alignment. ORIF proximal ulna . Hardware intact. Anatomic alignment. Two images obtained. 0 minutes 23 seconds fluoroscopy time. IMPRESSION: Postsurgical changes about the right elbow . Electronically Signed   By: Cristina [name]  Register   On: 09/24/2015 16:19    Disposition: 01-Home or Self Care  Discharge Instructions  Call MD / Call 911    Complete by:  As directed   If you experience chest pain or shortness of breath, CALL 911 and be transported to the hospital emergency room.  If you develope a fever above 101.5 F, pus (white drainage) or increased drainage or redness at the wound, or calf pain, call your surgeon's office.   Constipation Prevention    Complete by:  As directed   Drink plenty of fluids.  Prune juice may be helpful.  You may use a stool softener, such as Colace (over the counter) 100 mg twice a day.  Use MiraLax (over the counter) for constipation as needed.   Diet - low sodium heart healthy    Complete by:  As directed   Diet general    Complete by:  As directed   Driving restrictions    Complete by:  As directed   No driving while taking narcotic pain meds.   Increase activity slowly as tolerated    Complete by:  As directed      Follow-up Information    Marianna Payment, MD In 2 weeks.   Specialty:  Orthopedic Surgery Why:  For suture removal, For wound re-check Contact information: [address] [place] [phone]            Signed: Marianna Payment 09/26/2015, 4:28 PM

## 2015-09-26 NOTE — Progress Notes (Signed)
Paged Dr Erlinda Hong about, per charge nurse, the medical director said patient is to be discharge today. Waiting return call

## 2015-09-26 NOTE — Progress Notes (Signed)
   Subjective:  Patient reports pain as mild.  Objective:   VITALS:   Vitals:   09/25/15 1955 09/25/15 2057 09/25/15 2200 09/26/15 0506  BP: (!) 160/89 (!) 145/107 133/82 133/83  Pulse: (!) 106 (!) 129 93 66  Resp: 18  18 18   Temp: 98.1 F (36.7 C) 99.1 F (37.3 C) 98.7 F (37.1 C) 98.6 F (37 C)  TempSrc: Oral Oral Oral Oral  SpO2: 91%  98% 100%  Weight:      Height:        Exam stable   Lab Results  Component Value Date   WBC 8.9 09/25/2015   HGB 9.2 (L) 09/25/2015   HCT 30.9 (L) 09/25/2015   MCV 91.4 09/25/2015   PLT 357 09/25/2015     Assessment/Plan:  2 Days Post-Op   - patient now refusing SNF, plan to dc home Sunday with HHPT - I spoke at length with patient and family that I do not feel that this is a good decision as patient is a significant fall risk - they understand the risks of going home against PT advice  Marianna Payment 09/26/2015, 1:15 PM [phone]

## 2015-09-26 NOTE — Progress Notes (Signed)
Physical Therapy Treatment Patient Details Name: Cristina [name] MRN: [id number] DOB: 1940/06/25 Today's Date: 09/26/2015    History of Present Illness Pt admitted for ORIF of R elbow. Pt sustained a fx in a fall on 09/15/15. PMH: recent L ankle fx with ORIF with ongoing NWB status, pt had been at [facility] for rehab and discharged home with daughter, afib, asthma, anxiety, CHF, HTN, back surgery.     PT Comments    Today's session focused on transfer education to pt and daughter now that anticipated d/c is to home vs snf. Daughter had effective in session carryover/improvement in technique and body mechanics. Would benefit from further instruction and practice with mom prior to discharge home to maximize safety and decrease fall risk. Acute PT to continue during hospital stay.   Follow Up Recommendations  SNF     Equipment Recommendations  Other (comment) (to be assessed next session, ? hospital bed, hemi walker)    Recommendations for Other Services       Precautions / Restrictions Precautions Precautions: Fall Required Braces or Orthoses: Sling Restrictions RUE Weight Bearing: Non weight bearing LLE Weight Bearing: Non weight bearing    Mobility  Bed Mobility   Bed Mobility: Supine to Sit     Supine to sit: Min assist;+2 for safety/equipment;HOB elevated     General bed mobility comments: HOB elevated only to ~25 degrees due to pt/daughter unsure if getting hospital bed or not. cues on sequencing and technique. most assistance needed with elevation of trunk into sitting. cues to pt not to bend and push on bed with left LE with partial bridging to get hips closer to edge of bed.  mod assist to lie back down into supine and 2 person mod assist to scoot pt up in bed using pad under pt's hips.                          Transfers Overall transfer level: Needs assistance Equipment used: None;Hemi-walker Transfers: Sit to/from Stand;Lateral/Scoot Transfers Sit to Stand: Max  assist;+2 safety/equipment;+2 physical assistance        Lateral/Scoot Transfers: Mod assist;Max assist General transfer comment: attempted a stand with use of hemiwalker, max assist with 2cd person for safety, did not fully stand as pt began to weight bear through left leg depite cues/assistance to avoid this. then switched to scoot/squat pivot transfers. x 1 bed<>wheelchair, PTA taking pt to w/c and daughter taking pt toward bed from chair. daughter unable to fully complete transfer back and 2 person assist was needed to safely get pt onto bed in seated position. once pt was settled, PTA and daughter went to rehab gym to practice transfers using gait belt. PTA transfered daughter 1st to show proper positioning, body mechanics and use of gait belt. Daughter then transfered PTA mat>wheelchair with cues to correct stance and posture/body mechanics.                                                Cognition Arousal/Alertness: Awake/alert   Overall Cognitive Status: History of cognitive impairments - at baseline       Memory: Decreased short-term memory               Pertinent Vitals/Pain Pain Assessment: 0-10 Pain Score: 6  Pain Location: right arm and left leg Pain Descriptors /  Indicators: Aching;Sore Pain Intervention(s): Limited activity within patient's tolerance;Monitored during session;Premedicated before session;Repositioned     PT Goals (current goals can now be found in the care plan section) Acute Rehab PT Goals Patient Stated Goal: be able to move better Time For Goal Achievement: 10/09/15 Potential to Achieve Goals: Fair Progress towards PT goals: Progressing toward goals    Frequency  Min 3X/week    PT Plan Current plan remains appropriate (however pt going home vs snf due to finacial reasons)    End of Session Equipment Utilized During Treatment: Gait belt;Other (comment) (right UE sling) Activity Tolerance: Patient tolerated treatment well;Patient limited  by pain Patient left: in bed;with call bell/phone within reach;with family/visitor present     Time: [id number] PT Time Calculation (min) (ACUTE ONLY): 27 min  Charges:  $Therapeutic Activity: 23-37 mins           Willow Ora 09/26/2015, 4:37 PM   Willow Ora, PTA, CLT Acute Rehab Services [phone] 09/26/15, 4:40 PM

## 2015-09-27 ENCOUNTER — Observation Stay (HOSPITAL_COMMUNITY): Payer: Medicare Other

## 2015-09-27 ENCOUNTER — Encounter (HOSPITAL_COMMUNITY): Payer: Self-pay | Admitting: General Practice

## 2015-09-27 DIAGNOSIS — J9600 Acute respiratory failure, unspecified whether with hypoxia or hypercapnia: Secondary | ICD-10-CM

## 2015-09-27 DIAGNOSIS — D62 Acute posthemorrhagic anemia: Secondary | ICD-10-CM | POA: Diagnosis not present

## 2015-09-27 DIAGNOSIS — Z452 Encounter for adjustment and management of vascular access device: Secondary | ICD-10-CM | POA: Diagnosis not present

## 2015-09-27 DIAGNOSIS — N184 Chronic kidney disease, stage 4 (severe): Secondary | ICD-10-CM | POA: Diagnosis present

## 2015-09-27 DIAGNOSIS — I950 Idiopathic hypotension: Secondary | ICD-10-CM | POA: Diagnosis not present

## 2015-09-27 DIAGNOSIS — I248 Other forms of acute ischemic heart disease: Secondary | ICD-10-CM | POA: Diagnosis not present

## 2015-09-27 DIAGNOSIS — N170 Acute kidney failure with tubular necrosis: Secondary | ICD-10-CM | POA: Diagnosis not present

## 2015-09-27 DIAGNOSIS — Z8781 Personal history of (healed) traumatic fracture: Secondary | ICD-10-CM | POA: Diagnosis not present

## 2015-09-27 DIAGNOSIS — K7200 Acute and subacute hepatic failure without coma: Secondary | ICD-10-CM | POA: Diagnosis not present

## 2015-09-27 DIAGNOSIS — I481 Persistent atrial fibrillation: Secondary | ICD-10-CM | POA: Diagnosis not present

## 2015-09-27 DIAGNOSIS — G9340 Encephalopathy, unspecified: Secondary | ICD-10-CM | POA: Diagnosis not present

## 2015-09-27 DIAGNOSIS — Z515 Encounter for palliative care: Secondary | ICD-10-CM | POA: Diagnosis not present

## 2015-09-27 DIAGNOSIS — I482 Chronic atrial fibrillation: Secondary | ICD-10-CM | POA: Diagnosis present

## 2015-09-27 DIAGNOSIS — I951 Orthostatic hypotension: Secondary | ICD-10-CM | POA: Diagnosis not present

## 2015-09-27 DIAGNOSIS — I952 Hypotension due to drugs: Secondary | ICD-10-CM

## 2015-09-27 DIAGNOSIS — J9601 Acute respiratory failure with hypoxia: Secondary | ICD-10-CM

## 2015-09-27 DIAGNOSIS — G9341 Metabolic encephalopathy: Secondary | ICD-10-CM | POA: Diagnosis not present

## 2015-09-27 DIAGNOSIS — S42461A Displaced fracture of medial condyle of right humerus, initial encounter for closed fracture: Secondary | ICD-10-CM | POA: Diagnosis present

## 2015-09-27 DIAGNOSIS — R6521 Severe sepsis with septic shock: Secondary | ICD-10-CM | POA: Diagnosis not present

## 2015-09-27 DIAGNOSIS — Z66 Do not resuscitate: Secondary | ICD-10-CM | POA: Diagnosis not present

## 2015-09-27 DIAGNOSIS — W19XXXA Unspecified fall, initial encounter: Secondary | ICD-10-CM | POA: Diagnosis present

## 2015-09-27 DIAGNOSIS — Z967 Presence of other bone and tendon implants: Secondary | ICD-10-CM | POA: Diagnosis not present

## 2015-09-27 DIAGNOSIS — D689 Coagulation defect, unspecified: Secondary | ICD-10-CM | POA: Diagnosis not present

## 2015-09-27 DIAGNOSIS — Z419 Encounter for procedure for purposes other than remedying health state, unspecified: Secondary | ICD-10-CM | POA: Diagnosis not present

## 2015-09-27 DIAGNOSIS — I110 Hypertensive heart disease with heart failure: Secondary | ICD-10-CM | POA: Diagnosis present

## 2015-09-27 DIAGNOSIS — R9431 Abnormal electrocardiogram [ECG] [EKG]: Secondary | ICD-10-CM

## 2015-09-27 DIAGNOSIS — E2740 Unspecified adrenocortical insufficiency: Secondary | ICD-10-CM | POA: Diagnosis not present

## 2015-09-27 DIAGNOSIS — I5032 Chronic diastolic (congestive) heart failure: Secondary | ICD-10-CM | POA: Diagnosis present

## 2015-09-27 DIAGNOSIS — I498 Other specified cardiac arrhythmias: Secondary | ICD-10-CM

## 2015-09-27 DIAGNOSIS — N179 Acute kidney failure, unspecified: Secondary | ICD-10-CM | POA: Diagnosis not present

## 2015-09-27 DIAGNOSIS — R410 Disorientation, unspecified: Secondary | ICD-10-CM | POA: Diagnosis not present

## 2015-09-27 DIAGNOSIS — A419 Sepsis, unspecified organism: Secondary | ICD-10-CM | POA: Diagnosis not present

## 2015-09-27 DIAGNOSIS — K759 Inflammatory liver disease, unspecified: Secondary | ICD-10-CM | POA: Diagnosis not present

## 2015-09-27 DIAGNOSIS — I480 Paroxysmal atrial fibrillation: Secondary | ICD-10-CM | POA: Diagnosis present

## 2015-09-27 DIAGNOSIS — J189 Pneumonia, unspecified organism: Secondary | ICD-10-CM | POA: Diagnosis not present

## 2015-09-27 DIAGNOSIS — Z9289 Personal history of other medical treatment: Secondary | ICD-10-CM | POA: Diagnosis not present

## 2015-09-27 DIAGNOSIS — R001 Bradycardia, unspecified: Secondary | ICD-10-CM

## 2015-09-27 DIAGNOSIS — E870 Hyperosmolality and hypernatremia: Secondary | ICD-10-CM | POA: Diagnosis not present

## 2015-09-27 DIAGNOSIS — S42451A Displaced fracture of lateral condyle of right humerus, initial encounter for closed fracture: Secondary | ICD-10-CM | POA: Diagnosis present

## 2015-09-27 DIAGNOSIS — Z7189 Other specified counseling: Secondary | ICD-10-CM | POA: Diagnosis not present

## 2015-09-27 DIAGNOSIS — Y95 Nosocomial condition: Secondary | ICD-10-CM | POA: Diagnosis not present

## 2015-09-27 DIAGNOSIS — E43 Unspecified severe protein-calorie malnutrition: Secondary | ICD-10-CM | POA: Diagnosis not present

## 2015-09-27 DIAGNOSIS — Z930 Tracheostomy status: Secondary | ICD-10-CM | POA: Diagnosis not present

## 2015-09-27 DIAGNOSIS — R7989 Other specified abnormal findings of blood chemistry: Secondary | ICD-10-CM | POA: Diagnosis not present

## 2015-09-27 LAB — COMPREHENSIVE METABOLIC PANEL
ALK PHOS: 129 U/L — AB (ref 38–126)
ALT: 3831 U/L — ABNORMAL HIGH (ref 14–54)
ANION GAP: 10 (ref 5–15)
AST: 5862 U/L — ABNORMAL HIGH (ref 15–41)
Albumin: 2 g/dL — ABNORMAL LOW (ref 3.5–5.0)
BILIRUBIN TOTAL: 2.9 mg/dL — AB (ref 0.3–1.2)
BUN: 23 mg/dL — ABNORMAL HIGH (ref 6–20)
CALCIUM: 7.6 mg/dL — AB (ref 8.9–10.3)
CO2: 17 mmol/L — ABNORMAL LOW (ref 22–32)
Chloride: 107 mmol/L (ref 101–111)
Creatinine, Ser: 1.78 mg/dL — ABNORMAL HIGH (ref 0.44–1.00)
GFR calc non Af Amer: 27 mL/min — ABNORMAL LOW (ref >60)
GFR, EST AFRICAN AMERICAN: 31 mL/min — AB (ref >60)
Glucose, Bld: 173 mg/dL — ABNORMAL HIGH (ref 65–99)
Potassium: 5.2 mmol/L — ABNORMAL HIGH (ref 3.5–5.1)
Sodium: 134 mmol/L — ABNORMAL LOW (ref 135–145)
TOTAL PROTEIN: 5 g/dL — AB (ref 6.5–8.1)

## 2015-09-27 LAB — ECHOCARDIOGRAM COMPLETE
CHL CUP RV SYS PRESS: 47 mmHg
FS: 29 % (ref 28–44)
HEIGHTINCHES: 64 in
IVS/LV PW RATIO, ED: 0.95
LA ID, A-P, ES: 48 mm
LADIAMINDEX: 2.47 cm/m2
LAVOLA4C: 93.8 mL
LEFT ATRIUM END SYS DIAM: 48 mm
LV e' LATERAL: 7.51 cm/s
LVOT area: 2.01 cm2
LVOT diameter: 16 mm
Lateral S' vel: 8 cm/s
PW: 9.62 mm — AB (ref 0.6–1.1)
RV TAPSE: 11.2 mm
Reg peak vel: 332 cm/s
TDI e' lateral: 7.51
TR max vel: 332 cm/s
WEIGHTICAEL: 2864 [oz_av]

## 2015-09-27 LAB — URINE MICROSCOPIC-ADD ON

## 2015-09-27 LAB — CBC
HEMATOCRIT: 31.6 % — AB (ref 36.0–46.0)
Hemoglobin: 9.5 g/dL — ABNORMAL LOW (ref 12.0–15.0)
MCH: 28.4 pg (ref 26.0–34.0)
MCHC: 30.1 g/dL (ref 30.0–36.0)
MCV: 94.3 fL (ref 78.0–100.0)
Platelets: 379 10*3/uL (ref 150–400)
RBC: 3.35 MIL/uL — AB (ref 3.87–5.11)
RDW: 15.6 % — AB (ref 11.5–15.5)
WBC: 12.7 10*3/uL — AB (ref 4.0–10.5)

## 2015-09-27 LAB — CBC WITH DIFFERENTIAL/PLATELET
BASOS PCT: 0 %
Basophils Absolute: 0 10*3/uL (ref 0.0–0.1)
EOS ABS: 0 10*3/uL (ref 0.0–0.7)
EOS PCT: 0 %
HEMATOCRIT: 36.1 % (ref 36.0–46.0)
HEMOGLOBIN: 10.9 g/dL — AB (ref 12.0–15.0)
LYMPHS ABS: 1.2 10*3/uL (ref 0.7–4.0)
Lymphocytes Relative: 4 %
MCH: 27.8 pg (ref 26.0–34.0)
MCHC: 30.2 g/dL (ref 30.0–36.0)
MCV: 92.1 fL (ref 78.0–100.0)
MONO ABS: 0.9 10*3/uL (ref 0.1–1.0)
Monocytes Relative: 3 %
Neutro Abs: 26.9 10*3/uL — ABNORMAL HIGH (ref 1.7–7.7)
Neutrophils Relative %: 93 %
Platelets: ADEQUATE 10*3/uL (ref 150–400)
RBC: 3.92 MIL/uL (ref 3.87–5.11)
RDW: 15.5 % (ref 11.5–15.5)
WBC: 29 10*3/uL — AB (ref 4.0–10.5)

## 2015-09-27 LAB — GLUCOSE, CAPILLARY
Glucose-Capillary: 148 mg/dL — ABNORMAL HIGH (ref 65–99)
Glucose-Capillary: 185 mg/dL — ABNORMAL HIGH (ref 65–99)

## 2015-09-27 LAB — APTT
APTT: 41 s — AB (ref 24–36)
aPTT: 46 seconds — ABNORMAL HIGH (ref 24–36)

## 2015-09-27 LAB — PHOSPHORUS: PHOSPHORUS: 6.7 mg/dL — AB (ref 2.5–4.6)

## 2015-09-27 LAB — POCT I-STAT 3, ART BLOOD GAS (G3+)
ACID-BASE DEFICIT: 5 mmol/L — AB (ref 0.0–2.0)
BICARBONATE: 21.5 mmol/L (ref 20.0–28.0)
O2 Saturation: 99 %
PH ART: 7.309 — AB (ref 7.350–7.450)
TCO2: 23 mmol/L (ref 0–100)
pCO2 arterial: 42.4 mmHg (ref 32.0–48.0)
pO2, Arterial: 157 mmHg — ABNORMAL HIGH (ref 83.0–108.0)

## 2015-09-27 LAB — PROTIME-INR
INR: 5.14
INR: 6.91
PROTHROMBIN TIME: 43.9 s — AB (ref 11.4–15.2)
PROTHROMBIN TIME: 62 s — AB (ref 11.4–15.2)
Prothrombin Time: 48.9 seconds — ABNORMAL HIGH (ref 11.4–15.2)

## 2015-09-27 LAB — URINALYSIS, ROUTINE W REFLEX MICROSCOPIC
GLUCOSE, UA: NEGATIVE mg/dL
HGB URINE DIPSTICK: NEGATIVE
Ketones, ur: NEGATIVE mg/dL
Leukocytes, UA: NEGATIVE
Nitrite: NEGATIVE
Protein, ur: 30 mg/dL — AB
Specific Gravity, Urine: 1.023 (ref 1.005–1.030)
pH: 5.5 (ref 5.0–8.0)

## 2015-09-27 LAB — LACTIC ACID, PLASMA: LACTIC ACID, VENOUS: 2.8 mmol/L — AB (ref 0.5–1.9)

## 2015-09-27 LAB — MAGNESIUM: MAGNESIUM: 1.9 mg/dL (ref 1.7–2.4)

## 2015-09-27 LAB — PROCALCITONIN: PROCALCITONIN: 1.24 ng/mL

## 2015-09-27 LAB — TROPONIN I: Troponin I: 0.08 ng/mL (ref <0.03)

## 2015-09-27 MED ORDER — ETOMIDATE 2 MG/ML IV SOLN
20.0000 mg | Freq: Once | INTRAVENOUS | Status: AC
  Administered 2015-09-27: 20 mg via INTRAVENOUS

## 2015-09-27 MED ORDER — FAMOTIDINE IN NACL 20-0.9 MG/50ML-% IV SOLN
20.0000 mg | Freq: Two times a day (BID) | INTRAVENOUS | Status: DC
  Administered 2015-09-27 (×2): 20 mg via INTRAVENOUS
  Filled 2015-09-27 (×2): qty 50

## 2015-09-27 MED ORDER — SODIUM CHLORIDE 0.9% FLUSH
10.0000 mL | Freq: Two times a day (BID) | INTRAVENOUS | Status: DC
  Administered 2015-09-27: 10 mL
  Administered 2015-09-29: 30 mL
  Administered 2015-09-29: 10 mL
  Administered 2015-09-30: 20 mL
  Administered 2015-09-30 – 2015-10-01 (×2): 10 mL

## 2015-09-27 MED ORDER — BUDESONIDE 0.5 MG/2ML IN SUSP
0.5000 mg | Freq: Two times a day (BID) | RESPIRATORY_TRACT | Status: DC
  Administered 2015-09-27 – 2015-10-15 (×35): 0.5 mg via RESPIRATORY_TRACT
  Filled 2015-09-27 (×36): qty 2

## 2015-09-27 MED ORDER — FENTANYL CITRATE (PF) 100 MCG/2ML IJ SOLN
INTRAMUSCULAR | Status: AC
  Filled 2015-09-27: qty 2

## 2015-09-27 MED ORDER — SODIUM CHLORIDE 0.9 % IV BOLUS (SEPSIS): 250.0000 mL | Freq: Once | INTRAVENOUS | Status: DC

## 2015-09-27 MED ORDER — ASPIRIN 300 MG RE SUPP
150.0000 mg | Freq: Every day | RECTAL | Status: DC
Start: 2015-09-28 — End: 2015-09-28

## 2015-09-27 MED ORDER — ROCURONIUM BROMIDE 50 MG/5ML IV SOLN
50.0000 mg | Freq: Once | INTRAVENOUS | Status: AC
  Administered 2015-09-27: 50 mg via INTRAVENOUS
  Filled 2015-09-27: qty 5

## 2015-09-27 MED ORDER — ORAL CARE MOUTH RINSE
15.0000 mL | Freq: Four times a day (QID) | OROMUCOSAL | Status: DC
  Administered 2015-09-27 – 2015-10-03 (×25): 15 mL via OROMUCOSAL

## 2015-09-27 MED ORDER — MIDAZOLAM HCL 2 MG/2ML IJ SOLN
1.0000 mg | INTRAMUSCULAR | Status: AC | PRN
  Administered 2015-09-27 – 2015-09-28 (×3): 1 mg via INTRAVENOUS
  Filled 2015-09-27 (×3): qty 2

## 2015-09-27 MED ORDER — VANCOMYCIN HCL IN DEXTROSE 750-5 MG/150ML-% IV SOLN
750.0000 mg | Freq: Two times a day (BID) | INTRAVENOUS | Status: DC
  Administered 2015-09-28: 750 mg via INTRAVENOUS
  Filled 2015-09-27 (×2): qty 150

## 2015-09-27 MED ORDER — FENTANYL CITRATE (PF) 100 MCG/2ML IJ SOLN
50.0000 ug | INTRAMUSCULAR | Status: DC | PRN
  Administered 2015-09-27 (×2): 50 ug via INTRAVENOUS
  Filled 2015-09-27 (×2): qty 2

## 2015-09-27 MED ORDER — INSULIN ASPART 100 UNIT/ML ~~LOC~~ SOLN
0.0000 [IU] | SUBCUTANEOUS | Status: DC
  Administered 2015-09-27: 3 [IU] via SUBCUTANEOUS
  Administered 2015-09-27: 4 [IU] via SUBCUTANEOUS
  Administered 2015-09-28 – 2015-10-05 (×6): 3 [IU] via SUBCUTANEOUS
  Administered 2015-10-06: 4 [IU] via SUBCUTANEOUS
  Administered 2015-10-08 (×3): 3 [IU] via SUBCUTANEOUS
  Administered 2015-10-09: 4 [IU] via SUBCUTANEOUS
  Administered 2015-10-09 – 2015-10-10 (×5): 3 [IU] via SUBCUTANEOUS
  Administered 2015-10-11: 4 [IU] via SUBCUTANEOUS
  Administered 2015-10-11: 3 [IU] via SUBCUTANEOUS
  Administered 2015-10-11: 4 [IU] via SUBCUTANEOUS
  Administered 2015-10-12: 3 [IU] via SUBCUTANEOUS
  Administered 2015-10-12 (×2): 4 [IU] via SUBCUTANEOUS
  Administered 2015-10-12: 3 [IU] via SUBCUTANEOUS
  Administered 2015-10-12: 4 [IU] via SUBCUTANEOUS
  Administered 2015-10-12: 3 [IU] via SUBCUTANEOUS
  Administered 2015-10-13 – 2015-10-14 (×7): 4 [IU] via SUBCUTANEOUS
  Administered 2015-10-14: 3 [IU] via SUBCUTANEOUS
  Administered 2015-10-14: 4 [IU] via SUBCUTANEOUS

## 2015-09-27 MED ORDER — SODIUM CHLORIDE 0.9 % IV SOLN
INTRAVENOUS | Status: DC | PRN
  Administered 2015-09-27: 10 mL/h via INTRA_ARTERIAL

## 2015-09-27 MED ORDER — SODIUM CHLORIDE 0.9% FLUSH: 10.0000 mL | INTRAVENOUS | Status: DC | PRN

## 2015-09-27 MED ORDER — NOREPINEPHRINE BITARTRATE 1 MG/ML IV SOLN
0.0000 ug/min | INTRAVENOUS | Status: DC
  Administered 2015-09-27: 30 ug/min via INTRAVENOUS
  Administered 2015-09-28: 18 ug/min via INTRAVENOUS
  Filled 2015-09-27: qty 16

## 2015-09-27 MED ORDER — ASPIRIN 81 MG PO CHEW
324.0000 mg | CHEWABLE_TABLET | ORAL | Status: AC
  Administered 2015-09-27: 324 mg via ORAL
  Filled 2015-09-27: qty 4

## 2015-09-27 MED ORDER — SODIUM CHLORIDE 0.9 % IV SOLN
250.0000 mL | INTRAVENOUS | Status: DC | PRN
  Administered 2015-10-01: 250 mL via INTRAVENOUS

## 2015-09-27 MED ORDER — CHLORHEXIDINE GLUCONATE 0.12 % MT SOLN
15.0000 mL | Freq: Two times a day (BID) | OROMUCOSAL | Status: DC
  Administered 2015-09-27 – 2015-10-14 (×34): 15 mL via OROMUCOSAL
  Filled 2015-09-27 (×10): qty 15

## 2015-09-27 MED ORDER — MIDAZOLAM HCL 2 MG/2ML IJ SOLN
INTRAMUSCULAR | Status: AC
  Filled 2015-09-27: qty 2

## 2015-09-27 MED ORDER — ASPIRIN 300 MG RE SUPP: 300.0000 mg | RECTAL | Status: AC

## 2015-09-27 MED ORDER — SODIUM CHLORIDE 0.9 % IV SOLN
INTRAVENOUS | Status: DC
  Administered 2015-09-27 – 2015-09-28 (×2): via INTRAVENOUS

## 2015-09-27 MED ORDER — VANCOMYCIN HCL 10 G IV SOLR
1500.0000 mg | Freq: Once | INTRAVENOUS | Status: AC
  Administered 2015-09-27: 1500 mg via INTRAVENOUS
  Filled 2015-09-27: qty 1500

## 2015-09-27 MED ORDER — MIDAZOLAM HCL 2 MG/2ML IJ SOLN
1.0000 mg | INTRAMUSCULAR | Status: DC | PRN
  Administered 2015-09-27 – 2015-09-30 (×5): 1 mg via INTRAVENOUS
  Filled 2015-09-27 (×6): qty 2

## 2015-09-27 MED ORDER — FENTANYL CITRATE (PF) 100 MCG/2ML IJ SOLN
50.0000 ug | INTRAMUSCULAR | Status: DC | PRN
  Administered 2015-09-27 – 2015-09-28 (×4): 50 ug via INTRAVENOUS
  Filled 2015-09-27 (×4): qty 2

## 2015-09-27 MED ORDER — MIDAZOLAM HCL 2 MG/2ML IJ SOLN
2.0000 mg | Freq: Once | INTRAMUSCULAR | Status: AC
  Administered 2015-09-27: 2 mg via INTRAVENOUS

## 2015-09-27 MED ORDER — LEVOFLOXACIN IN D5W 750 MG/150ML IV SOLN
750.0000 mg | INTRAVENOUS | Status: DC
  Administered 2015-09-27: 750 mg via INTRAVENOUS
  Filled 2015-09-27 (×2): qty 150

## 2015-09-27 MED ORDER — FENTANYL CITRATE (PF) 100 MCG/2ML IJ SOLN
100.0000 ug | Freq: Once | INTRAMUSCULAR | Status: AC
  Administered 2015-09-27: 100 ug via INTRAVENOUS

## 2015-09-27 MED ORDER — ALBUTEROL SULFATE (2.5 MG/3ML) 0.083% IN NEBU
INHALATION_SOLUTION | RESPIRATORY_TRACT | Status: AC
  Administered 2015-09-27: 2.5 mg
  Filled 2015-09-27: qty 3

## 2015-09-27 MED ORDER — PHENYLEPHRINE HCL 10 MG/ML IJ SOLN
0.0000 ug/min | INTRAVENOUS | Status: DC
  Filled 2015-09-27: qty 1

## 2015-09-27 MED ORDER — HEPARIN SODIUM (PORCINE) 5000 UNIT/ML IJ SOLN: 5000.0000 [IU] | Freq: Three times a day (TID) | INTRAMUSCULAR | Status: DC

## 2015-09-27 MED ORDER — IPRATROPIUM-ALBUTEROL 0.5-2.5 (3) MG/3ML IN SOLN
3.0000 mL | Freq: Four times a day (QID) | RESPIRATORY_TRACT | Status: DC
  Administered 2015-09-27 – 2015-10-02 (×19): 3 mL via RESPIRATORY_TRACT
  Filled 2015-09-27 (×19): qty 3

## 2015-09-27 NOTE — Progress Notes (Signed)
   Subjective:  Patient reports dizziness, nausea Declined OT  Objective:   VITALS:   Vitals:   09/26/15 1300 09/26/15 2025 09/27/15 0445 09/27/15 0627  BP: 115/63 (!) 147/76 (!) 82/41 (!) 95/54  Pulse: 79 93 (!) 59 64  Resp: 18 18 16    Temp: 98.9 F (37.2 C) 99.4 F (37.4 C) 98.1 F (36.7 C) 97.7 F (36.5 C)  TempSrc: Oral Oral Oral Oral  SpO2: 93% 95% 100% 94%  Weight:      Height:        Exam stable   Lab Results  Component Value Date   WBC 8.9 09/25/2015   HGB 9.2 (L) 09/25/2015   HCT 30.9 (L) 09/25/2015   MCV 91.4 09/25/2015   PLT 357 09/25/2015     Assessment/Plan:  3 Days Post-Op   - patient's blood pressure and heart rate were labile - hospitalist consult placed - CBC ordered - patient not able to complete PT/OT this morning - I do not feel she's stable for discharge currently  Marianna Payment 09/27/2015, 10:55 AM [phone]

## 2015-09-27 NOTE — Progress Notes (Signed)
[facility] Progress Note Patient Name: Cristina [name] DOB: 10/06/1940 MRN: [id number]   Date of Service  09/27/2015  HPI/Events of Note  Multiple issues: 1. INR = 5.14. Patient is on [place]. 2. Troponin = 0.08.   eICU Interventions  Will order: 1. Repeat PT/INR now.     Intervention Category Intermediate Interventions: Diagnostic test evaluation  Gracelynne Benedict Cornelia Copa 09/27/2015, 8:13 PM

## 2015-09-27 NOTE — Progress Notes (Signed)
Pharmacy Antibiotic Note Cristina [name] is a 75 y.o. female admitted on 09/24/2015 with pneumonia.  Pharmacy has been consulted for Levaquin and vancomycin dosing.  Plan: Vancomycin 750 IV every 12 hours.  Goal trough 15-20 mcg/mL.  Levaquin 750 mg IV every 24 hours   Height: 5\' 4"  (162.6 cm) Weight: 179 lb (81.2 kg) IBW/kg (Calculated) : 54.7  Temp (24hrs), Avg:98.2 F (36.8 C), Min:97.6 F (36.4 C), Max:99.4 F (37.4 C)   Recent Labs Lab 09/24/15 0930 09/24/15 2055 09/25/15 0517 09/27/15 1122  WBC 11.5* 12.5* 8.9 12.7*  CREATININE 0.73 0.77 0.79  --     Estimated Creatinine Clearance: 61.7 mL/min (by C-G formula based on SCr of 0.8 mg/dL).    Allergies  Allergen Reactions  . Augmentin [Amoxicillin-Pot Clavulanate] Anaphylaxis and Other (See Comments)    Has patient had a PCN reaction causing immediate rash, facial/tongue/throat swelling, SOB or lightheadedness with hypotension: Yes Has patient had a PCN reaction causing severe rash involving mucus membranes or skin necrosis: No Has patient had a PCN reaction that required hospitalization No Has patient had a PCN reaction occurring within the last 10 years: Yes If all of the above answers are "NO", then may proceed with Cephalosporin use.  [name] Kitchen Lisinopril Anaphylaxis and Cough    Antimicrobials this admission: 9/10 Levaquin  >>  9/10 vancomycin  >>   Dose adjustments this admission: n/a  Microbiology results: 9/10 BCx:    Thank you for allowing pharmacy to be a part of this patient's care.  Vincenza Hews, PharmD, BCPS 09/27/2015, 3:47 PM Pager: [phone]

## 2015-09-27 NOTE — Progress Notes (Signed)
RT attempted times 3 for a aline with blood flow but not able to thread catheter.  RN made aware and RT attempted to page ACNP to notifiy with no call back.  Pt tolerated well.  RT will continue to monitor.

## 2015-09-27 NOTE — Progress Notes (Signed)
Upon arrival, patient hypotensive and bradycardic, stating she was needing an inhaler that she was having difficulty breathing. Albuterol treatment given per patients request, per Rapid Response nurse at this time with no change in breathing. Patient guppy breathing and placed on a NRB. Transferring to [place] for intubation. Receiving RT waiting for intubation.

## 2015-09-27 NOTE — Progress Notes (Signed)
PT Cancellation Note  Patient Details Name: Cristina [name] MRN: [id number] DOB: 05/27/1940   Cancelled Treatment:    Reason Eval/Treat Not Completed: Medical issues which prohibited therapy.  Pt moved to [place], intubated.  Not stable to see today.  PT to check back to see if medical issues stabilize. MDs, please advise when pt is ready to restart mobility.    Thanks,    Barbarann Ehlers. [place], [place], DPT [phone]   09/27/2015, 3:11 PM

## 2015-09-27 NOTE — Procedures (Signed)
Intubation Procedure Note Cristina [name] [id number] [birth year]  Procedure: Intubation Indications: Airway protection and maintenance  Procedure Details Consent: Risks of procedure as well as the alternatives and risks of each were explained to the (patient/caregiver).  Consent for procedure obtained. Time Out: Verified patient identification, verified procedure, site/side was marked, verified correct patient position, special equipment/implants available, medications/allergies/relevent history reviewed, required imaging and test results available.  Performed  MAC and 3 Medications:  Fentanyl 100 mcg Etomidate 20 mg Versed 2 mg NMB Zemuron 50 mg   Evaluation Hemodynamic Status: BP stable throughout; O2 sats: stable throughout Patient's Current Condition: stable Complications: No apparent complications Patient did tolerate procedure well. Chest X-ray ordered to verify placement.  CXR: pending.   Richardson Landry Aryaa Bunting ACNP Maryanna Shape PCCM Pager [phone] till 3 pm If no answer page [phone] 09/27/2015, 2:46 PM

## 2015-09-27 NOTE — Significant Event (Addendum)
Rapid Response Event Note  Overview: Time Called: [id number] Arrival Time: 1330 Event Type: Respiratory, Neurologic, Cardiac, Hypotension  Initial Focused Assessment: Called by primary RN to assess patient. Per primary RN  "patient was having trouble breathing and needed a breathing treatment." Upon arrival, patient was in distress, patient was cool, clammy, diaphoretic, her breathing was labored, HR in the 40s, and hypotensive. Patient HOB was made flat, NS bolus started. CCM MD called for ICU admission  Interventions: -- NS bolus started -- EKG obtained -- Atropine 0.5mg  given at 1318, with no improvement in the HR or her symptoms -- placed on NRB 100% by RT -- CCM MD present, ? Lateral-septal infarct ??  -- Family updated   Plan of Care (if not transferred): Emergently transferred to CICU, CODE STEMI [place] CICU.  Event Summary: Name of Physician Notified: ORTHO MD at 1335  Name of Consulting Physician Notified: Dr. Larkin Ina CCM MD at 1338  Outcome: Transferred (Comment)  Event End Time: [place], [place]

## 2015-09-27 NOTE — Consult Note (Signed)
PULMONARY / CRITICAL CARE MEDICINE   Name: Cristina [name] MRN: [id number] DOB: 1940/03/01    ADMISSION DATE:  09/24/2015 CONSULTATION DATE:  09/27/15  REFERRING MD: Dr. Erlinda Hong  CHIEF COMPLAINT:  Pt admitted after a fall. PCCM consulted for hypotension and bradycardia.   HISTORY OF PRESENT ILLNESS:   Cristina [name] is a 75 y.o. female who presents for surgical treatment of right distal humerus fracture.  She denies any changes in medical history. She had a fall on 9/7. Orthopedic did ORIF of Right distal humerus fracture on 9/7.   Patient has chronic atrial fibrillation. She was Xarelto which was discontinued during surgery and resumed post operatively.  The patient was doing okay until last night. She started having low blood pressure which persisted until this morning. Was diaphoretic and complaining of back pain. Rapid response was called. Blood pressure was 60 systolic. Heart rate was in the 50s. Atropine 1 mg IV given. PCCM consulted. EKG done showed junctional rhythm, Q waves in 1 and aVL with t wave inversion as well, new from baseline.  Transferred to [facility].  I spoke with Cardiology with concern for active ischemia. Ortho Dr. Erlinda Hong updated.    PAST MEDICAL HISTORY :  She  has a past medical history of Anxiety; Asthma; Atrial fibrillation ([place]); Dysrhythmia; History of gout; Hyperlipemia; Hypertension; Pneumonia (~ 2012); and Shortness of breath dyspnea.  PAST SURGICAL HISTORY: She  has a past surgical history that includes Ankle fracture surgery (Right, 2000s); ORIF ankle fracture (Left, 08/19/2015); Fracture surgery; Tonsillectomy and adenoidectomy (1950s); Lumbar disc surgery (1990s); Back surgery; and ORIF humerus fracture (Right, 09/24/2015).  Allergies  Allergen Reactions  . Augmentin [Amoxicillin-Pot Clavulanate] Anaphylaxis and Other (See Comments)    Has patient had a PCN reaction causing immediate rash, facial/tongue/throat swelling, SOB or lightheadedness with hypotension:  Yes Has patient had a PCN reaction causing severe rash involving mucus membranes or skin necrosis: No Has patient had a PCN reaction that required hospitalization No Has patient had a PCN reaction occurring within the last 10 years: Yes If all of the above answers are "NO", then may proceed with Cephalosporin use.  [name] Kitchen Lisinopril Anaphylaxis and Cough    No current facility-administered medications on file prior to encounter.    Current Outpatient Prescriptions on File Prior to Encounter  Medication Sig  . ALPRAZolam (XANAX) 0.25 MG tablet Take 0.25 mg by mouth 3 (three) times daily as needed for anxiety.   [name] Kitchen atorvastatin (LIPITOR) 20 MG tablet Take 20 mg by mouth at bedtime.   . citalopram (CELEXA) 20 MG tablet Take 20 mg by mouth at bedtime.  . methocarbamol (ROBAXIN) 750 MG tablet Take 1 tablet (750 mg total) by mouth 2 (two) times daily as needed for muscle spasms.  . metoprolol succinate (TOPROL-XL) 100 MG 24 hr tablet Take 100 mg by mouth at bedtime. Take with or immediately following a meal.  . naproxen sodium (ANAPROX) 220 MG tablet Take 440 mg by mouth 2 (two) times daily as needed (for pain).   . ondansetron (ZOFRAN) 4 MG tablet Take 1-2 tablets (4-8 mg total) by mouth every 8 (eight) hours as needed for nausea or vomiting.  [name] Kitchen oxyCODONE-acetaminophen (PERCOCET) 5-325 MG tablet Take 1-2 tablets by mouth every 4 (four) hours as needed for severe pain.  . pramipexole (MIRAPEX) 1 MG tablet Take 2 mg by mouth at bedtime.   . verapamil (CALAN-SR) 240 MG CR tablet Take 240 mg by mouth at bedtime.  . Vitamin D, Ergocalciferol, (  DRISDOL) 50000 units CAPS capsule Take 50,000 Units by mouth every 7 (seven) days. Pt takes on Tuesday.  . colchicine 0.6 MG tablet Take 0.6 mg by mouth 2 (two) times daily as needed (for gout flares).   . rivaroxaban (XARELTO) 20 MG TABS tablet Take 1 tablet (20 mg total) by mouth daily with supper.  . senna-docusate (SENOKOT S) 8.6-50 MG tablet Take 1 tablet by  mouth at bedtime as needed. (Patient not taking: Reported on 09/15/2015)    FAMILY HISTORY:  Her  No info available.   SOCIAL HISTORY: She  reports that she has never smoked. She has never used smokeless tobacco. She reports that she drinks alcohol. She reports that she does not use drugs. 8 SOB and dizziness and "back pain" and sense of doom since last night, worse this am. Diaphoretic this am.   SUBJECTIVE:  Doctor'sSOB and dizziness and "back pain" and sense of doom since last night, worse this am. Diaphoretic this am.   VITAL SIGNS: BP 121/88   Pulse (!) 57   Temp 97.9 F (36.6 C) (Oral)   Resp 18   Ht 5\' 4"  (1.626 m)   Wt 81.2 kg (179 lb)   SpO2 100%   BMI 30.73 kg/m   HEMODYNAMICS: CVP:  [12 mmHg] 12 mmHg  VENTILATOR SETTINGS: Vent Mode: PRVC FiO2 (%):  [100 %] 100 % Set Rate:  [16 bmp] 16 bmp Vt Set:  [500 mL] 500 mL PEEP:  [5 cmH20] 5 cmH20  INTAKE / OUTPUT: I/O last 3 completed shifts: In: 2272.9 [P.O.:600; I.V.:1672.9] Out: -   PHYSICAL EXAMINATION: General: Patient in respiratory distress. Comfortable postintubation. Neuro : Cranial nerves grossly intact. No lateralizing signs. HEENT:  PERLA, (-) NVD Cardiovascular:  Good s1/s2. (-) s3/m/r/g Lungs:  Dec BS BLF. Some crackles at bases.  Abdomen:  (+) BS, soft, NT, obese Musculoskeletal:  Gr 1 edema Skin:  Warm extremeities.   LABS:  BMET  Recent Labs Lab 09/24/15 0930 09/24/15 2055 09/25/15 0517  NA 144 142 141  K 3.6 3.5 3.3*  CL 105 105 105  CO2 29 31 30   BUN 16 13 12   CREATININE 0.73 0.77 0.79  GLUCOSE 100* 103* 99    Electrolytes  Recent Labs Lab 09/24/15 0930 09/24/15 2055 09/25/15 0517  CALCIUM 9.4 8.3* 8.3*    CBC  Recent Labs Lab 09/24/15 2055 09/25/15 0517 09/27/15 1122  WBC 12.5* 8.9 12.7*  HGB 9.7* 9.2* 9.5*  HCT 32.2* 30.9* 31.6*  PLT 382 357 379    Coag's  Recent Labs Lab 09/24/15 0930  INR 1.10    Sepsis Markers No results for input(s):  LATICACIDVEN, PROCALCITON, O2SATVEN in the last 168 hours.  ABG  Recent Labs Lab 09/27/15 1725  PHART 7.309*  PCO2ART 42.4  PO2ART 157.0*    Liver Enzymes No results for input(s): AST, ALT, ALKPHOS, BILITOT, ALBUMIN in the last 168 hours.  Cardiac Enzymes No results for input(s): TROPONINI, PROBNP in the last 168 hours.  Glucose  Recent Labs Lab 09/27/15 1724  GLUCAP 148*    Imaging Dg Chest Port 1 View  Result Date: 09/27/2015 CLINICAL DATA:  Intubation EXAM: PORTABLE CHEST 1 VIEW COMPARISON:  September 27, 2015 FINDINGS: The ETT is in good position as is a left central line. No pneumothorax. Transcutaneous pacers overlie the left chest limiting evaluation. No convincing evidence of focal infiltrate, nodule, or mass. IMPRESSION: The ETT and right central line are in good position. Electronically Signed   By: Shanon Brow  Mee Hives M.D   On: 09/27/2015 15:36   Dg Chest Port 1 View  Result Date: 09/27/2015 CLINICAL DATA:  Patient has been feeling well today. Postop right humerus, low BP. Tightness in the chest with shortness of breath and wheezing. EXAM: PORTABLE CHEST 1 VIEW COMPARISON:  01/23/2014 FINDINGS: Semi-erect portable view of the chest. There is mild consolidation now present in the left lower lobe with mild air bronchograms, suspicious for infiltrate. There is trace left-sided pleural effusion. A small amount of linear atelectasis is present in the right midlung zone. Heart size is mildly enlarged. There is atherosclerosis of the aorta. No pneumothorax. Old bilateral left greater than right rib fractures. There is post traumatic deformity of the proximal left humerus as before. IMPRESSION: Mild consolidation in the left retrocardiac space with mild air bronchograms and small effusion. Findings are suggestive of pneumonia. Mild cardiomegaly with slight central vascular congestion compared to prior. Atherosclerotic vascular disease of the aorta Electronically Signed   By: Donavan Foil M.D.   On: 09/27/2015 14:48     STUDIES:    CULTURES: Blood 9/10 > Sputum 9/10 > MRSA 9/10 >  ANTIBIOTICS: Levaquin 9/10 > Vanc 9/10 >   SIGNIFICANT EVENTS: 9/7 admit after fall. Had repair of right shoulder surgery. 9/10 PCCM consulted for hypotension, bradycardia. Intubated for airway.   LINES/TUBES: L Ij 9/10  DISCUSSION: 21 female, admitted after a fall, had repair of her right shoulder on 9/7, don't have chronic atrial fibrillation, becoming hypotensive overnight and noted to be bradycardic today. Was diaphoretic. Noted to be junctional rhythm with nonspecific ST-T wave changes. Intubated for airway protection.   ASSESSMENT / PLAN:  PULMONARY A: Acute hypoxemic respiratory failure 2/2 unable to protect her airway, concern for sepsis, concern for NSTEMI/demand ischemia P:   Chest x-ray with some infiltrate over the left lower both base. I think pneumonia is not severe enough to cause the hypotension and bradycardia. Continue vent support for now. Pancultrue. Cardiology has been consulted re: bradycardia and hypotension.   CARDIOVASCULAR A:  Bradycardia. Hypotension.  Could be related to metoprolol and calcium channel blocker received. Patient is on chronic  metoprolol.  Concern for demand ischemia given EKG changes.  Concern for sepsis/PNA but CXR is not too overwhelming.  Chronic Atrial fibrillation. P:  Cards has been consulted.  Will defer to cards re: cardiac meds and heparin.  Check troponin and echo.  Cont NOAC Cont vasopressors > wean as able.  RENAL A:   No issues P:   Check lytes  GASTROINTESTINAL A:   No issues P:   NPO for now  HEMATOLOGIC A:   No issues P:  Check cbc  INFECTIOUS A:   LLL PNA. Possible sepsis vs drug induced hypotension vs cardiac/NSTEMI P:   Pancultrue. Levaquin and Vancomycin.  By time I saw her, she has been adequately resuscitated and has received 2.2 L.  Repeat sepsis assessment completed.  Pressors have been started.    ENDOCRINE A:   No issues P:   NPO SSI   NEUROLOGIC A:   Anxiety/pain P:   RASS goal: 0- -1 Fentanyl and versed prn   FAMILY  - Updates: Daughter has been updated extensively.  - Inter-disciplinary family meet or Palliative Care meeting due by:  10/04/15  Critical care time spent on this patient today: 35 minutes.   Monica Becton, MD [facility] [place] Pager: [phone] After 3 pm or if no response, call  [phone]  09/27/2015, 6:13 PM

## 2015-09-27 NOTE — Procedures (Signed)
Central Venous Catheter Insertion Procedure Note RAYAUNA PAIS [id number] 1940-09-15  Procedure: Insertion of Central Venous Catheter Indications: Assessment of intravascular volume, Drug and/or fluid administration and Frequent blood sampling  Procedure Details Consent: Risks of procedure as well as the alternatives and risks of each were explained to the (patient/caregiver).  Consent for procedure obtained. Time Out: Verified patient identification, verified procedure, site/side was marked, verified correct patient position, special equipment/implants available, medications/allergies/relevent history reviewed, required imaging and test results available.  Performed  Maximum sterile technique was used including antiseptics, cap, gloves, gown, hand hygiene, mask and sheet. Skin prep: Chlorhexidine; local anesthetic administered A antimicrobial bonded/coated triple lumen catheter was placed in the left internal jugular vein using the Seldinger technique. Ultrasound guidance used.Yes.   Catheter placed to 20 cm. Blood aspirated via all 3 ports and then flushed x 3. Line sutured x 2 and dressing applied.  Evaluation Blood flow good Complications: No apparent complications Patient did tolerate procedure well. Chest X-ray ordered to verify placement.  CXR: pending.  Richardson Landry Ethelbert Thain ACNP Maryanna Shape PCCM Pager [phone] till 3 pm If no answer page [phone] 09/27/2015, 2:47 PM

## 2015-09-27 NOTE — Progress Notes (Signed)
  Echocardiogram 2D Echocardiogram has been performed.  Cristina [name] 09/27/2015, 4:33 PM

## 2015-09-27 NOTE — Progress Notes (Signed)
OT Cancellation Note  Patient Details Name: Cristina [name] MRN: [id number] DOB: 11-01-1940   Cancelled Treatment:    Reason Eval/Treat Not Completed: Other (comment).  RN states okay to work with pt. This am.  Attempted skilled OT with pt. And dtr (primary caregiver) for family ed in preparation for d/c home.  Pt. Declines stating "i just feel so dizzy right now".  Dtr. Declined need for any transfer review or education. " we practiced that a lot yesterday and I really feel like im good".  Reviewed bed mobility, bsc tranfers, bathing options for review and dtr. Continued to politely decline.  Will check back prior to d/c for any acute OT needs.    Janice Coffin, COTA/L 09/27/2015, 10:08 AM

## 2015-09-27 NOTE — Progress Notes (Signed)
75 y.o. female with PMH of HTN, HPL, A fib (on xarelto), recent ORIF left ankle, sustained another fall fractured shoulder and underwent ORIF of right distal humerus. Patient was planned to discharge to SNF. She declined and preferred to go home. But primary service did not feel safe to discharge patient home today.  -Patient suddenly became diaphoretic, developed hypotension and bradycardia, rapid response was called. Patient was seen by intensivinst, required intubation and transfer to icu. She received toprol 100mg +verapamyl last night which may explain bradycardia/hypotension. But, chest x ray showed left lower lobe infiltrate, likely has pneumonia. Defer further management to intensivist, cardiology. D/w intensivist, cardiology  Kinnie Feil

## 2015-09-27 NOTE — Consult Note (Addendum)
CARDIOLOGY CONSULT NOTE  Patient ID: Cristina [name] MRN: [id number] DOB/AGE: 76-Feb-1942 76 y.o.  Admit date: 09/24/2015 Primary Physician: Tawanna Solo, MD Referring Physician: Corrie Dandy MD  Reason for Consultation: acute hypotension, abnormal ECG  HPI: 75 yr old woman with h/o atrial fibrillation who recently underwent ORIF for distal humerus fracture 9/7 after sustaining a fall. Xarelto had been d/c prior to surgery and subsequently resumed. Reportedly developed hypotension last night which persisted until today. Was also diaphoretic and c/o back pain. SBP 60's, HR 50's, atropine given.  Echo 07/23/13 showed vigorous LV systolic function, EF [id number].   ECG showed junctional rhythm with Q waves in I and aVL suspicious for high lateral infarct, and septal Q waves (possible old septal infarct).  Spoke with critical care and hospital medicine physicians. Levophed at 30 mcg/min being given. She had reportedly taken both Toprol-XL 100 mg yesterday and verapamil 240 mg. These have now been stopped.  Chest xray reportedly showed left lower lobe infiltrate consistent with pneumonia. Antibiotics are being ordered by hospitalist.  Currently intubated and sedated.  Telemetry shows sinus bradycardia.  SBP 90-100 mmHg.    Allergies  Allergen Reactions  . Augmentin [Amoxicillin-Pot Clavulanate] Anaphylaxis and Other (See Comments)    Has patient had a PCN reaction causing immediate rash, facial/tongue/throat swelling, SOB or lightheadedness with hypotension: Yes Has patient had a PCN reaction causing severe rash involving mucus membranes or skin necrosis: No Has patient had a PCN reaction that required hospitalization No Has patient had a PCN reaction occurring within the last 10 years: Yes If all of the above answers are "NO", then may proceed with Cephalosporin use.  [name] Kitchen Lisinopril Anaphylaxis and Cough    Current Facility-Administered Medications  Medication Dose Route  Frequency Provider Last Rate Last Dose  . acetaminophen (TYLENOL) tablet 650 mg  650 mg Oral Q6H PRN Naiping Ephriam Jenkins, MD       Or  . acetaminophen (TYLENOL) suppository 650 mg  650 mg Rectal Q6H PRN Leandrew Koyanagi, MD      . ALPRAZolam Duanne Moron) tablet 0.25 mg  0.25 mg Oral TID PRN Leandrew Koyanagi, MD   0.25 mg at 09/27/15 1255  . atorvastatin (LIPITOR) tablet 20 mg  20 mg Oral QHS Leandrew Koyanagi, MD   20 mg at 09/26/15 2135  . citalopram (CELEXA) tablet 20 mg  20 mg Oral QHS Leandrew Koyanagi, MD   20 mg at 09/26/15 2135  . colchicine tablet 0.6 mg  0.6 mg Oral BID PRN Leandrew Koyanagi, MD      . diphenhydrAMINE (BENADRYL) 12.5 MG/5ML elixir 25 mg  25 mg Oral Q4H PRN Naiping Ephriam Jenkins, MD      . etomidate (AMIDATE) injection 20 mg  20 mg Intravenous Once [employer], MD      . fentaNYL (SUBLIMAZE) 100 MCG/2ML injection           . fentaNYL (SUBLIMAZE) injection 100 mcg  100 mcg Intravenous Once [employer] A de Larkin Ina, MD      . lactated ringers infusion   Intravenous Continuous Lyndle Herrlich, MD 10 mL/hr at 09/24/15 0945    . magnesium citrate solution 1 Bottle  1 Bottle Oral Once PRN Leandrew Koyanagi, MD      . methocarbamol (ROBAXIN) tablet 500 mg  500 mg Oral Q6H PRN Leandrew Koyanagi, MD   500 mg at 09/26/15 1859   Or  . methocarbamol (ROBAXIN)  500 mg in dextrose 5 % 50 mL IVPB  500 mg Intravenous Q6H PRN Naiping Ephriam Jenkins, MD      . methocarbamol (ROBAXIN) tablet 750 mg  750 mg Oral BID PRN Leandrew Koyanagi, MD      . metoCLOPramide (REGLAN) tablet 5-10 mg  5-10 mg Oral Q8H PRN Leandrew Koyanagi, MD   10 mg at 09/27/15 1255   Or  . metoCLOPramide (REGLAN) injection 5-10 mg  5-10 mg Intravenous Q8H PRN Naiping Ephriam Jenkins, MD      . midazolam (VERSED) 2 MG/2ML injection           . midazolam (VERSED) injection 2 mg  2 mg Intravenous Once [employer] Angel[employer] A de Larkin Ina, MD      . morphine 2 MG/ML injection 1 mg  1 mg Intravenous Q2H PRN Leandrew Koyanagi, MD   1 mg at 09/25/15 0855  . norepinephrine (LEVOPHED) 16 mg in dextrose 5 % 250 mL (0.064  mg/mL) infusion  0-40 mcg/min Intravenous Titrated Jose Angelo A de Larkin Ina, MD      . ondansetron [facility]) tablet 4 mg  4 mg Oral Q6H PRN Naiping Ephriam Jenkins, MD       Or  . ondansetron [facility]) injection 4 mg  4 mg Intravenous Q6H PRN Naiping Ephriam Jenkins, MD      . ondansetron (ZOFRAN) tablet 4-8 mg  4-8 mg Oral Q8H PRN Naiping Ephriam Jenkins, MD      . oxyCODONE (Oxy IR/ROXICODONE) immediate release tablet 5-15 mg  5-15 mg Oral Q3H PRN Leandrew Koyanagi, MD   10 mg at 09/26/15 2134  . oxyCODONE-acetaminophen (PERCOCET/ROXICET) 5-325 MG per tablet 1-2 tablet  1-2 tablet Oral Q4H PRN Naiping Ephriam Jenkins, MD      . polyethylene glycol (MIRALAX / GLYCOLAX) packet 17 g  17 g Oral Daily PRN Leandrew Koyanagi, MD      . pramipexole (MIRAPEX) tablet 2 mg  2 mg Oral QHS Naiping Ephriam Jenkins, MD   2 mg at 09/26/15 2135  . rivaroxaban (XARELTO) tablet 20 mg  20 mg Oral Q supper Leandrew Koyanagi, MD   20 mg at 09/26/15 1647  . rocuronium (ZEMURON) injection 50 mg  50 mg Intravenous Once [employer], MD      . senna-docusate (Senokot-S) tablet 1 tablet  1 tablet Oral QHS PRN Naiping Ephriam Jenkins, MD      . sodium chloride 0.9 % bolus 250 mL  250 mL Intravenous Once Kinnie Feil, MD      . sorbitol 70 % solution 30 mL  30 mL Oral Daily PRN Leandrew Koyanagi, MD      . Derrill Memo ON 09/30/2015] Vitamin D (Ergocalciferol) (DRISDOL) capsule 50,000 Units  50,000 Units Oral Q7 days Naiping Ephriam Jenkins, MD        Past Medical History:  Diagnosis Date  . Anxiety   . Asthma   . Atrial fibrillation ([place])   . Dysrhythmia    PAF  . History of gout   . Hyperlipemia   . Hypertension   . Pneumonia ~ 2012  . Shortness of breath dyspnea    with exertion    Past Surgical History:  Procedure Laterality Date  . ANKLE FRACTURE SURGERY Right 2000s  . BACK SURGERY    . FRACTURE SURGERY    . [place] SURGERY  1990s  . ORIF ANKLE FRACTURE Left 08/19/2015   Procedure: OPEN REDUCTION INTERNAL FIXATION (ORIF) LEFT ANKLE  FRACTURE, POSSIBLE SYNDESMOSIS;  Surgeon: Leandrew Koyanagi, MD;   Location: [place];  Service: Orthopedics;  Laterality: Left;  . ORIF HUMERUS FRACTURE Right 09/24/2015   Procedure: OPEN REDUCTION INTERNAL FIXATION (ORIF) RIGHT DISTAL HUMERUS FRACTURE;  Surgeon: Leandrew Koyanagi, MD;  Location: [place];  Service: Orthopedics;  Laterality: Right;  . TONSILLECTOMY AND ADENOIDECTOMY  1950s    Social History   Social History  . Marital status: Divorced    Spouse name: N/A  . Number of children: N/A  . Years of education: N/A   Occupational History  . Retired    Social History Main Topics  . Smoking status: Never Smoker  . Smokeless tobacco: Never Used  . Alcohol use Yes     Comment: 08/20/2015 "glass of wine maybe once/month"  . Drug use: No  . Sexual activity: Yes   Other Topics Concern  . Not on file   Social History Narrative   Lives in [place] since 1982   Divorced   G6P4   daughters live close   Retired from [employer]   Non smoker/dirnks occ wine           No family history of premature CAD in 1st degree relatives.  Prior to Admission medications   Medication Sig Start Date End Date Taking? Authorizing Provider  ALPRAZolam (XANAX) 0.25 MG tablet Take 0.25 mg by mouth 3 (three) times daily as needed for anxiety.  09/12/15  Yes Historical Provider, MD  atorvastatin (LIPITOR) 20 MG tablet Take 20 mg by mouth at bedtime.    Yes Historical Provider, MD  citalopram (CELEXA) 20 MG tablet Take 20 mg by mouth at bedtime.   Yes Historical Provider, MD  methocarbamol (ROBAXIN) 750 MG tablet Take 1 tablet (750 mg total) by mouth 2 (two) times daily as needed for muscle spasms. 08/19/15  Yes Leandrew Koyanagi, MD  metoprolol succinate (TOPROL-XL) 100 MG 24 hr tablet Take 100 mg by mouth at bedtime. Take with or immediately following a meal.   Yes Historical Provider, MD  naproxen sodium (ANAPROX) 220 MG tablet Take 440 mg by mouth 2 (two) times daily as needed (for pain).    Yes Historical Provider, MD  ondansetron (ZOFRAN) 4 MG tablet Take 1-2 tablets (4-8  mg total) by mouth every 8 (eight) hours as needed for nausea or vomiting. 08/19/15  Yes Naiping Ephriam Jenkins, MD  oxyCODONE-acetaminophen (PERCOCET) 5-325 MG tablet Take 1-2 tablets by mouth every 4 (four) hours as needed for severe pain. 09/15/15  Yes Sharlett Iles, MD  pramipexole (MIRAPEX) 1 MG tablet Take 2 mg by mouth at bedtime.    Yes Historical Provider, MD  verapamil (CALAN-SR) 240 MG CR tablet Take 240 mg by mouth at bedtime.   Yes Historical Provider, MD  Vitamin D, Ergocalciferol, (DRISDOL) 50000 units CAPS capsule Take 50,000 Units by mouth every 7 (seven) days. Pt takes on Tuesday.   Yes Historical Provider, MD  colchicine 0.6 MG tablet Take 0.6 mg by mouth 2 (two) times daily as needed (for gout flares).     Jacolyn Reedy, MD  oxyCODONE (OXY IR/ROXICODONE) 5 MG immediate release tablet Take 1-3 tablets (5-15 mg total) by mouth every 4 (four) hours as needed. 09/24/15   Leandrew Koyanagi, MD  oxyCODONE (OXYCONTIN) 10 mg 12 hr tablet Take 1 tablet (10 mg total) by mouth every 12 (twelve) hours. 09/24/15   Naiping Ephriam Jenkins, MD  rivaroxaban (XARELTO) 20 MG TABS tablet Take 1 tablet (20  mg total) by mouth daily with supper. 03/06/15   Lelon Perla, MD  senna-docusate (SENOKOT S) 8.6-50 MG tablet Take 1 tablet by mouth at bedtime as needed. Patient not taking: Reported on 09/15/2015 08/19/15   Leandrew Koyanagi, MD     Review of systems complete and found to be negative unless listed above in HPI     Physical exam Blood pressure (!) 60/49, pulse (!) 48, temperature 97.6 F (36.4 C), temperature source Axillary, resp. rate 16, height 5\' 4"  (1.626 m), weight 179 lb (81.2 kg), SpO2 97 %. General: Intubated and sedated. Neck: No JVD CV: Nondisplaced PMI. Bradycardic, no murmurs, nl S1S2.  No peripheral edema.    Abdomen: No distention.    ECG: Most recent ECG reviewed.  Labs:   Lab Results  Component Value Date   WBC 12.7 (H) 09/27/2015   HGB 9.5 (L) 09/27/2015   HCT 31.6 (L) 09/27/2015   MCV  94.3 09/27/2015   PLT 379 09/27/2015    Recent Labs Lab 09/25/15 0517  NA 141  K 3.3*  CL 105  CO2 30  BUN 12  CREATININE 0.79  CALCIUM 8.3*  GLUCOSE 99   Lab Results  Component Value Date   TROPONINI <0.30 07/23/2013   No results found for: CHOL No results found for: HDL No results found for: LDLCALC No results found for: TRIG No results found for: CHOLHDL No results found for: LDLDIRECT       Studies: No results found.  ASSESSMENT AND PLAN:  1. Hypotension/junctional rhythm: This may all be due to sepsis from pneumonia. Bradycardia may be due to beta blockers and calcium channel blockers which have now been stopped. Will obtain serial serum troponins to evaluate for ACS. Will obtain stat echocardiogram to evaluate cardiac structure and function.  2. HCAP: Antibiotics to be ordered by hospitalist.  Time spent: 75 minutes.   Signed: Kate Sable, M.D., F.A.C.C.  09/27/2015, 2:42 PM

## 2015-09-27 NOTE — Progress Notes (Signed)
Patient's heart rate has been drooping to 45 with 2-3 sec pauses, CCMD called twice regarding this. Patient asymptomatic; she was asleep.  Repositioned patient and will have MD consider Internal Medicine consult for assistance during evening hours if necessary.

## 2015-09-28 ENCOUNTER — Inpatient Hospital Stay (HOSPITAL_COMMUNITY): Payer: Medicare Other

## 2015-09-28 DIAGNOSIS — R7989 Other specified abnormal findings of blood chemistry: Secondary | ICD-10-CM

## 2015-09-28 DIAGNOSIS — A419 Sepsis, unspecified organism: Secondary | ICD-10-CM

## 2015-09-28 DIAGNOSIS — R6521 Severe sepsis with septic shock: Secondary | ICD-10-CM

## 2015-09-28 DIAGNOSIS — R778 Other specified abnormalities of plasma proteins: Secondary | ICD-10-CM

## 2015-09-28 DIAGNOSIS — I951 Orthostatic hypotension: Secondary | ICD-10-CM

## 2015-09-28 DIAGNOSIS — K759 Inflammatory liver disease, unspecified: Secondary | ICD-10-CM

## 2015-09-28 LAB — BLOOD GAS, ARTERIAL
Acid-base deficit: 7.5 mmol/L — ABNORMAL HIGH (ref 0.0–2.0)
Bicarbonate: 17.9 mmol/L — ABNORMAL LOW (ref 20.0–28.0)
Drawn by: 24513
FIO2: 40
LHR: 14 {breaths}/min
O2 SAT: 97.5 %
PEEP/CPAP: 5 cmH2O
PH ART: 7.285 — AB (ref 7.350–7.450)
Patient temperature: 98.6
VT: 500 mL
pCO2 arterial: 38.9 mmHg (ref 32.0–48.0)
pO2, Arterial: 116 mmHg — ABNORMAL HIGH (ref 83.0–108.0)

## 2015-09-28 LAB — TYPE AND SCREEN
ABO/RH(D): O POS
Antibody Screen: NEGATIVE

## 2015-09-28 LAB — GLUCOSE, CAPILLARY
GLUCOSE-CAPILLARY: 108 mg/dL — AB (ref 65–99)
GLUCOSE-CAPILLARY: 110 mg/dL — AB (ref 65–99)
GLUCOSE-CAPILLARY: 125 mg/dL — AB (ref 65–99)
GLUCOSE-CAPILLARY: 142 mg/dL — AB (ref 65–99)
Glucose-Capillary: 102 mg/dL — ABNORMAL HIGH (ref 65–99)
Glucose-Capillary: 88 mg/dL (ref 65–99)

## 2015-09-28 LAB — BASIC METABOLIC PANEL
Anion gap: 8 (ref 5–15)
BUN: 25 mg/dL — AB (ref 6–20)
CO2: 20 mmol/L — AB (ref 22–32)
Calcium: 7.6 mg/dL — ABNORMAL LOW (ref 8.9–10.3)
Chloride: 112 mmol/L — ABNORMAL HIGH (ref 101–111)
Creatinine, Ser: 1.9 mg/dL — ABNORMAL HIGH (ref 0.44–1.00)
GFR calc Af Amer: 28 mL/min — ABNORMAL LOW (ref >60)
GFR, EST NON AFRICAN AMERICAN: 25 mL/min — AB (ref >60)
GLUCOSE: 139 mg/dL — AB (ref 65–99)
POTASSIUM: 4.6 mmol/L (ref 3.5–5.1)
Sodium: 140 mmol/L (ref 135–145)

## 2015-09-28 LAB — CBC
HEMATOCRIT: 34.3 % — AB (ref 36.0–46.0)
Hemoglobin: 10.3 g/dL — ABNORMAL LOW (ref 12.0–15.0)
MCH: 27.5 pg (ref 26.0–34.0)
MCHC: 30 g/dL (ref 30.0–36.0)
MCV: 91.5 fL (ref 78.0–100.0)
PLATELETS: 444 10*3/uL — AB (ref 150–400)
RBC: 3.75 MIL/uL — AB (ref 3.87–5.11)
RDW: 15.1 % (ref 11.5–15.5)
WBC: 23.2 10*3/uL — ABNORMAL HIGH (ref 4.0–10.5)

## 2015-09-28 LAB — PROTIME-INR
INR: 7.88
Prothrombin Time: 68.8 seconds — ABNORMAL HIGH (ref 11.4–15.2)

## 2015-09-28 LAB — PROCALCITONIN: PROCALCITONIN: 7.26 ng/mL

## 2015-09-28 LAB — CORTISOL: Cortisol, Plasma: 14.3 ug/dL

## 2015-09-28 LAB — PHOSPHORUS: Phosphorus: 5.5 mg/dL — ABNORMAL HIGH (ref 2.5–4.6)

## 2015-09-28 LAB — TROPONIN I
TROPONIN I: 0.33 ng/mL — AB (ref <0.03)
TROPONIN I: 0.32 ng/mL — AB (ref <0.03)

## 2015-09-28 LAB — ABO/RH: ABO/RH(D): O POS

## 2015-09-28 LAB — MAGNESIUM: Magnesium: 1.6 mg/dL — ABNORMAL LOW (ref 1.7–2.4)

## 2015-09-28 MED ORDER — LEVOFLOXACIN IN D5W 750 MG/150ML IV SOLN: 750.0000 mg | INTRAVENOUS | Status: DC

## 2015-09-28 MED ORDER — FENTANYL CITRATE (PF) 2500 MCG/50ML IJ SOLN
25.0000 ug/h | INTRAMUSCULAR | Status: DC
  Administered 2015-09-28 – 2015-09-30 (×2): 50 ug/h via INTRAVENOUS
  Administered 2015-10-01: 100 ug/h via INTRAVENOUS
  Administered 2015-10-02 – 2015-10-04 (×3): 75 ug/h via INTRAVENOUS
  Filled 2015-09-28 (×8): qty 50

## 2015-09-28 MED ORDER — ASPIRIN 81 MG PO CHEW
81.0000 mg | CHEWABLE_TABLET | Freq: Every day | ORAL | Status: DC
  Administered 2015-09-28 – 2015-10-14 (×17): 81 mg via ORAL
  Filled 2015-09-28 (×17): qty 1

## 2015-09-28 MED ORDER — HYDROCORTISONE NA SUCCINATE PF 100 MG IJ SOLR
50.0000 mg | Freq: Four times a day (QID) | INTRAMUSCULAR | Status: DC
  Administered 2015-09-28 – 2015-10-01 (×13): 50 mg via INTRAVENOUS
  Filled 2015-09-28 (×13): qty 2

## 2015-09-28 MED ORDER — FENTANYL BOLUS VIA INFUSION
25.0000 ug | INTRAVENOUS | Status: DC | PRN
  Administered 2015-09-28 – 2015-09-29 (×3): 25 ug via INTRAVENOUS
  Administered 2015-09-29: 50 ug via INTRAVENOUS
  Administered 2015-09-30 – 2015-10-05 (×6): 25 ug via INTRAVENOUS
  Filled 2015-09-28: qty 25

## 2015-09-28 MED ORDER — MAGNESIUM SULFATE 2 GM/50ML IV SOLN
2.0000 g | Freq: Once | INTRAVENOUS | Status: AC
  Administered 2015-09-28: 2 g via INTRAVENOUS
  Filled 2015-09-28: qty 50

## 2015-09-28 MED ORDER — FENTANYL CITRATE (PF) 100 MCG/2ML IJ SOLN
50.0000 ug | Freq: Once | INTRAMUSCULAR | Status: AC
  Administered 2015-09-28: 50 ug via INTRAVENOUS
  Filled 2015-09-28: qty 2

## 2015-09-28 MED ORDER — SODIUM CHLORIDE 0.9 % IV SOLN: Freq: Once | INTRAVENOUS | Status: DC

## 2015-09-28 MED ORDER — SODIUM CHLORIDE 0.9 % IV BOLUS (SEPSIS)
1000.0000 mL | Freq: Once | INTRAVENOUS | Status: AC
  Administered 2015-09-28: 1000 mL via INTRAVENOUS

## 2015-09-28 MED ORDER — PANTOPRAZOLE SODIUM 40 MG PO PACK
40.0000 mg | PACK | Freq: Every day | ORAL | Status: DC
  Administered 2015-09-28 – 2015-10-14 (×17): 40 mg
  Filled 2015-09-28 (×17): qty 20

## 2015-09-28 MED ORDER — VANCOMYCIN HCL IN DEXTROSE 1-5 GM/200ML-% IV SOLN
1000.0000 mg | INTRAVENOUS | Status: DC
  Administered 2015-09-29: 1000 mg via INTRAVENOUS
  Filled 2015-09-28: qty 200

## 2015-09-28 NOTE — Progress Notes (Signed)
eLink Physician-Brief Progress Note Patient Name: Cristina [name] DOB: 09-19-1940 MRN: [id number]   Date of Service  09/28/2015  HPI/Events of Note  Oliguria.  eICU Interventions  Will give IV fluid bolus.      Intervention Category Major Interventions: Other:  Aurther Harlin 09/28/2015, 6:37 AM

## 2015-09-28 NOTE — Progress Notes (Signed)
PT Cancellation Note  Patient Details Name: Cristina [name] MRN: [id number] DOB: 05/26/1940   Cancelled Treatment:    Reason Eval/Treat Not Completed: Medical issues which prohibited therapy (Pt still intubated. Nurse asked PT to check back Wed.) MD please advise as to activity. Thanks.   [name] 09/28/2015, 9:31 AM  [name] [phone] (pager)

## 2015-09-28 NOTE — Progress Notes (Signed)
SUBJECTIVE:  Intubated and sedated  OBJECTIVE:   Vitals:   Vitals:   09/28/15 1300 09/28/15 1345 09/28/15 1400 09/28/15 1515  BP: 127/81 118/63 (!) 122/55 111/70  Pulse: 86 90 87   Resp: 15 19 14    Temp:  98.2 F (36.8 C) 98.5 F (36.9 C) 97.8 F (36.6 C)  TempSrc:  Oral Oral Oral  SpO2: 100% 100% 100%   Weight:      Height:       I&O's:   Intake/Output Summary (Last 24 hours) at 09/28/15 1536 Last data filed at 09/28/15 1400  Gross per 24 hour  Intake          3396.15 ml  Output              406 ml  Net          2990.15 ml   TELEMETRY: Reviewed telemetry pt in NSR with PACs:     PHYSICAL EXAM General: intubated Head: Eyes PERRLA, No xanthomas.   Normal cephalic and atramatic  Lungs:   Clear bilaterally to auscultation anteriorly. Heart:   HRRR with occasional ectopy S1 S2 Pulses are 2+ & equal. Abdomen: Bowel sounds are positive, abdomen soft and non-tender without masses  Msk:  Back normal, normal gait. Normal strength and tone for age. Extremities:   No clubbing, cyanosis or edema.  DP +1 Neuro: sedated Psych:  sedated   LABS: Basic Metabolic Panel:  Recent Labs  09/27/15 1855 09/28/15 0225  NA 134* 140  K 5.2* 4.6  CL 107 112*  CO2 17* 20*  GLUCOSE 173* 139*  BUN 23* 25*  CREATININE 1.78* 1.90*  CALCIUM 7.6* 7.6*  MG 1.9 1.6*  PHOS 6.7* 5.5*   Liver Function Tests:  Recent Labs  09/27/15 1855  AST 5,862*  ALT 3,831*  ALKPHOS 129*  BILITOT 2.9*  PROT 5.0*  ALBUMIN 2.0*   No results for input(s): LIPASE, AMYLASE in the last 72 hours. CBC:  Recent Labs  09/27/15 1830 09/28/15 0225  WBC 29.0* 23.2*  NEUTROABS 26.9*  --   HGB 10.9* 10.3*  HCT 36.1 34.3*  MCV 92.1 91.5  PLT PLATELET CLUMPS NOTED ON SMEAR, COUNT APPEARS ADEQUATE 444*   Cardiac Enzymes:  Recent Labs  09/27/15 1855 09/28/15 0225 09/28/15 0730  TROPONINI 0.08* 0.33* 0.32*   BNP: Invalid input(s): POCBNP D-Dimer: No results for input(s): DDIMER in the  last 72 hours. Hemoglobin A1C: No results for input(s): HGBA1C in the last 72 hours. Fasting Lipid Panel: No results for input(s): CHOL, HDL, LDLCALC, TRIG, CHOLHDL, LDLDIRECT in the last 72 hours. Thyroid Function Tests: No results for input(s): TSH, T4TOTAL, T3FREE, THYROIDAB in the last 72 hours.  Invalid input(s): FREET3 Anemia Panel: No results for input(s): VITAMINB12, FOLATE, FERRITIN, TIBC, IRON, RETICCTPCT in the last 72 hours. Coag Panel:   Lab Results  Component Value Date   INR 7.88 (HH) 09/28/2015   INR 6.91 (HH) 09/27/2015   INR 5.14 (HH) 09/27/2015    RADIOLOGY: Dg Elbow 2 Views Right  Result Date: 09/24/2015 CLINICAL DATA:  ORIF. EXAM: RIGHT ELBOW - 2 VIEW; DG C-ARM GT 120 MIN COMPARISON:  CT 09/15/2015. FINDINGS: ORIF distal humeral fracture. Hardware intact. Anatomic alignment. ORIF proximal ulna . Hardware intact. Anatomic alignment. Two images obtained. 0 minutes 23 seconds fluoroscopy time. IMPRESSION: Postsurgical changes about the right elbow . Electronically Signed   By: Elmer   On: 09/24/2015 16:19   Dg Elbow 2 Views Right  Result Date:  09/15/2015 CLINICAL DATA:  Right elbow pain and bruising posteriorly after a fall today. EXAM: RIGHT ELBOW - 2 VIEW COMPARISON:  None. FINDINGS: Oblique comminuted fracture demonstrated along the distal humeral shaft and extending to the intercondylar region. Fracture lines extend to the medial and lateral condyles. There is impaction and displacement of the distal fracture fragments. No definite dislocation of the elbow joint although limited positioning is available. Moderate size right elbow effusion. IMPRESSION: Comminuted and displaced impacted fractures of the distal right humeral shaft extending into the intercondylar region, involving both medial and lateral condyles. Associated effusion. Electronically Signed   By: Lucienne Capers M.D.   On: 09/15/2015 19:20   Dg Forearm Right  Result Date: 09/15/2015 CLINICAL  DATA:  Fall.  Elbow pain. EXAM: RIGHT FOREARM - 2 VIEW COMPARISON:  None. FINDINGS: Acute oblique fracture of the distal humerus. 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 No forearm fracture is seen. IMPRESSION: 1. Distal humeral fracture. 2. Madelung deformity of the distal radius/ulna. Electronically Signed   By: Van Clines M.D.   On: 09/15/2015 21:38   Ct Elbow Right Wo Contrast  Result Date: 09/16/2015 CLINICAL DATA:  Patient fell wall today at home. Right elbow fractures. EXAM: CT OF THE RIGHT ELBOW WITHOUT CONTRAST; 3-DIMENSIONAL CT IMAGE RENDERING ON ACQUISITION WORKSTATION TECHNIQUE: Multidetector CT imaging was performed according to the standard protocol. Multiplanar CT image reconstructions were also generated. COMPARISON:  Right elbow radiographs 09/15/2015 FINDINGS: Comminuted fractures demonstrated of the distal right humerus. Oblique fracture line begins in the distal shaft and extends towards the intercondylar region. There is mild posterior displacement and overlap of the distal fracture fragments. Transverse fracture lines extend to the medial and lateral epicondyles. Condylar fragments are displaced and distracted. Impaction of the condylar fragments into the distal humerus. The ulnar and radial joints appear preserved without evidence of dislocation at the joints. Fracture lines do not appear to extend to the articular surfaces. Soft tissue swelling and effusion are present. Incidental note of a fracture of the lateral right ribs which could be acute. IMPRESSION: Comminuted fractures of the distal right humerus involving the distal shaft and intercondylar regions. Impaction and distraction of the condylar fragments. Posterior angulation and displacement of the distal shaft fragment. Fracture lines do not appear to be intraarticular and there is no evidence of dislocation of the joint. Incidental note of a right lateral rib fracture which could be  acute. Electronically Signed   By: Lucienne Capers M.D.   On: 09/16/2015 01:02   Ct 3d Recon At Scanner  Result Date: 09/16/2015 CLINICAL DATA:  Patient fell wall today at home. Right elbow fractures. EXAM: CT OF THE RIGHT ELBOW WITHOUT CONTRAST; 3-DIMENSIONAL CT IMAGE RENDERING ON ACQUISITION WORKSTATION TECHNIQUE: Multidetector CT imaging was performed according to the standard protocol. Multiplanar CT image reconstructions were also generated. COMPARISON:  Right elbow radiographs 09/15/2015 FINDINGS: Comminuted fractures demonstrated of the distal right humerus. Oblique fracture line begins in the distal shaft and extends towards the intercondylar region. There is mild posterior displacement and overlap of the distal fracture fragments. Transverse fracture lines extend to the medial and lateral epicondyles. Condylar fragments are displaced and distracted. Impaction of the condylar fragments into the distal humerus. The ulnar and radial joints appear preserved without evidence of dislocation at the joints. Fracture lines do not appear to extend to the articular surfaces. Soft tissue swelling and effusion are present. Incidental note of a fracture of the lateral right ribs which could  be acute. IMPRESSION: Comminuted fractures of the distal right humerus involving the distal shaft and intercondylar regions. Impaction and distraction of the condylar fragments. Posterior angulation and displacement of the distal shaft fragment. Fracture lines do not appear to be intraarticular and there is no evidence of dislocation of the joint. Incidental note of a right lateral rib fracture which could be acute. Electronically Signed   By: Lucienne Capers M.D.   On: 09/16/2015 01:02   Dg Chest Port 1 View  Result Date: 09/28/2015 CLINICAL DATA:  Respiratory failure, history of asthma, atrial fibrillation, recent ORIF of a humeral fracture. EXAM: PORTABLE CHEST 1 VIEW COMPARISON:  Portable chest x-ray of September 27, 2015 FINDINGS: The lungs are less well inflated on the current study. There is bibasilar density consistent with atelectasis and small pleural effusions more conspicuous today. The pulmonary interstitial markings are slightly increased overall as well. The cardiac silhouette remains enlarged. There is calcification in the wall of the aortic arch. External pacemaker defibrillator pads are in stable position. The endotracheal tube tip lies 3.7 cm above the carina. The esophagogastric tube tip projects below the inferior margin of the image. The left internal jugular venous catheter tip projects over the midportion of the SVC. IMPRESSION: Mild interval deterioration in the appearance of the chest. Increased bibasilar atelectasis or infiltrate is present with small bilateral pleural effusions. No pneumothorax. Stable mild pulmonary vascular congestion with mild cardiomegaly. Aortic atherosclerosis. The support tubes are in reasonable position. Electronically Signed   By: David  Martinique M.D.   On: 09/28/2015 07:19   Dg Chest Port 1 View  Result Date: 09/27/2015 CLINICAL DATA:  Intubation EXAM: PORTABLE CHEST 1 VIEW COMPARISON:  September 27, 2015 FINDINGS: The ETT is in good position as is a left central line. No pneumothorax. Transcutaneous pacers overlie the left chest limiting evaluation. No convincing evidence of focal infiltrate, nodule, or mass. IMPRESSION: The ETT and right central line are in good position. Electronically Signed   By: Dorise Bullion III M.D   On: 09/27/2015 15:36   Dg Chest Port 1 View  Result Date: 09/27/2015 CLINICAL DATA:  Patient has been feeling well today. Postop right humerus, low BP. Tightness in the chest with shortness of breath and wheezing. EXAM: PORTABLE CHEST 1 VIEW COMPARISON:  01/23/2014 FINDINGS: Semi-erect portable view of the chest. There is mild consolidation now present in the left lower lobe with mild air bronchograms, suspicious for infiltrate. There is trace  left-sided pleural effusion. A small amount of linear atelectasis is present in the right midlung zone. Heart size is mildly enlarged. There is atherosclerosis of the aorta. No pneumothorax. Old bilateral left greater than right rib fractures. There is post traumatic deformity of the proximal left humerus as before. IMPRESSION: Mild consolidation in the left retrocardiac space with mild air bronchograms and small effusion. Findings are suggestive of pneumonia. Mild cardiomegaly with slight central vascular congestion compared to prior. Atherosclerotic vascular disease of the aorta Electronically Signed   By: Donavan Foil M.D.   On: 09/27/2015 14:48   Dg Humerus Right  Result Date: 09/15/2015 CLINICAL DATA:  Pain after trauma EXAM: RIGHT HUMERUS - 2+ VIEW COMPARISON:  None. FINDINGS: The distal humeral fracture is again evident, extending into the intercondylar region. This is seen to better advantage on the accompanying elbow radiographs. The remainder of the humerus is intact. IMPRESSION: Distal humeral fracture with mild apex -anterior angulation. Remainder of the humerus is intact. Electronically Signed   By: Andreas Newport  M.D.   On: 09/15/2015 21:44   Dg C-arm Gt 108 Min  Result Date: 09/24/2015 CLINICAL DATA:  ORIF. EXAM: RIGHT ELBOW - 2 VIEW; DG C-ARM GT 120 MIN COMPARISON:  CT 09/15/2015. FINDINGS: ORIF distal humeral fracture. Hardware intact. Anatomic alignment. ORIF proximal ulna . Hardware intact. Anatomic alignment. Two images obtained. 0 minutes 23 seconds fluoroscopy time. IMPRESSION: Postsurgical changes about the right elbow . Electronically Signed   By: Marcello Moores  Register   On: 09/24/2015 16:19    ASSESSMENT AND PLAN:  1. Hypotension/junctional rhythm: This may all be due to sepsis from pneumonia. Bradycardia may be due to beta blockers and calcium channel blockers which have now been stopped.  Telemetry now with NSR with PACs.  She is now off pressors.   2.  Elevated trop with  fairly flat trend most likely secondary to demand ischemia in setting of sepsis from PNA and recent trauma from fall in setting of acute kidney injury. 2D echo with low normal LVF with EF 50-55% and possible HK of the mid inferoseptal/apical septal and apical myocardium and moderate MR, moderately reduced RVF and moderate pulmonary HTN with PASP 4mmHg.  EF in July was 65%. Continue ASA/statin for now.  She did have evidence of coronary calcifications on chest CT last year.  Will consider nuclear stress test once recovered from acute illness.  3. HCAP: Antibiotics to be ordered by hospitalist.  4.  PAF maintaining NSR with PACs. Off Xarelto at present.   5.  Recent fall with ORIF of distal humeral fx.   Fransico Him, MD  09/28/2015  3:36 PM

## 2015-09-28 NOTE — Progress Notes (Signed)
OT Cancellation Note  Patient Details Name: LAYLANA KOSTER MRN: [id number] DOB: 1940-08-06   Cancelled Treatment:    Reason Eval/Treat Not Completed: Medical issues which prohibited therapy.  Pt intubated.  Will check back mid week to determine appropriateness of OT.    [place], OTR/L [id number]   Lucille Passy M 09/28/2015, 11:49 AM

## 2015-09-28 NOTE — Progress Notes (Signed)
CRITICAL VALUE ALERT  Critical value received: Pt/INR  Date of notification: 09/28/15  Time of notification:  [id number]  Critical value read back:yes  Nurse who received alert:  Zenovia Jordan RN  MD notified (1st page):  Nelda Marseille  Time of first page:  26  MD notified (2nd page):  Time of second page:  Responding MD:  Nelda Marseille  Time MD responded:  1055

## 2015-09-28 NOTE — Progress Notes (Signed)
PULMONARY / CRITICAL CARE MEDICINE   Name: Cristina [name] MRN: [id number] DOB: 12/28/1940    ADMISSION DATE:  09/24/2015 CONSULTATION DATE:  09/27/15  REFERRING MD: Dr. Erlinda Hong  CHIEF COMPLAINT:  Pt admitted after a fall. PCCM consulted for hypotension and bradycardia.   HISTORY OF PRESENT ILLNESS:   Cristina [name] is a 75 y.o. female who presents for surgical treatment of right distal humerus fracture.  She denies any changes in medical history. She had a fall on 9/7. Orthopedic did ORIF of Right distal humerus fracture on 9/7.   Patient has chronic atrial fibrillation. She was Xarelto which was discontinued during surgery and resumed post operatively.  The patient was doing okay until last night. She started having low blood pressure which persisted until this morning. Was diaphoretic and complaining of back pain. Rapid response was called. Blood pressure was 60 systolic. Heart rate was in the 50s. Atropine 1 mg IV given. PCCM consulted. EKG done showed junctional rhythm, Q waves in 1 and aVL with t wave inversion as well, new from baseline.  Transferred to [facility].  I spoke with Cardiology with concern for active ischemia. Ortho Dr. Erlinda Hong updated.   SUBJECTIVE:  Sedated and intubated, arousable.  VITAL SIGNS: BP 117/72   Pulse 70   Temp 98 F (36.7 C) (Oral)   Resp 18   Ht 5\' 4"  (1.626 m)   Wt 90.2 kg (198 lb 13.7 oz)   SpO2 100%   BMI 34.13 kg/m   HEMODYNAMICS: CVP:  [12 mmHg-14 mmHg] 14 mmHg  VENTILATOR SETTINGS: Vent Mode: PRVC FiO2 (%):  [40 %-100 %] 40 % Set Rate:  [14 bmp-16 bmp] 14 bmp Vt Set:  [500 mL] 500 mL PEEP:  [5 cmH20] 5 cmH20 Plateau Pressure:  [14 cmH20-17 cmH20] 14 cmH20  INTAKE / OUTPUT: I/O last 3 completed shifts: In: 1595.1 [I.V.:1305.1; NG/GT:40; IV Piggyback:250] Out: 365 [Urine:365]  PHYSICAL EXAMINATION: General: Patient in respiratory distress. Comfortable postintubation. Neuro : Cranial nerves grossly intact. No lateralizing signs. HEENT:   PERLA, (-) NVD Cardiovascular:  Good s1/s2. (-) s3/m/r/g Lungs:  Dec BS BLF. Some crackles at bases.  Abdomen:  (+) BS, soft, NT, obese Musculoskeletal:  Gr 1 edema Skin:  Warm extremeities.   LABS:  BMET  Recent Labs Lab 09/25/15 0517 09/27/15 1855 09/28/15 0225  NA 141 134* 140  K 3.3* 5.2* 4.6  CL 105 107 112*  CO2 30 17* 20*  BUN 12 23* 25*  CREATININE 0.79 1.78* 1.90*  GLUCOSE 99 173* 139*   Electrolytes  Recent Labs Lab 09/25/15 0517 09/27/15 1855 09/28/15 0225  CALCIUM 8.3* 7.6* 7.6*  MG  --  1.9 1.6*  PHOS  --  6.7* 5.5*   CBC  Recent Labs Lab 09/27/15 1122 09/27/15 1830 09/28/15 0225  WBC 12.7* 29.0* 23.2*  HGB 9.5* 10.9* 10.3*  HCT 31.6* 36.1 34.3*  PLT 379 PLATELET CLUMPS NOTED ON SMEAR, COUNT APPEARS ADEQUATE 444*   Coag's  Recent Labs Lab 09/27/15 1755 09/27/15 1855 09/27/15 2105  APTT 41* 46*  --   INR QUESTIONABLE RESULTS, RECOMMEND RECOLLECT TO VERIFY 5.14* 6.91*    Sepsis Markers  Recent Labs Lab 09/27/15 1756 09/27/15 1855 09/28/15 0225  LATICACIDVEN 2.8*  --   --   PROCALCITON  --  1.24 7.26    ABG  Recent Labs Lab 09/27/15 1725 09/28/15 0340  PHART 7.309* 7.285*  PCO2ART 42.4 38.9  PO2ART 157.0* 116*    Liver Enzymes  Recent Labs Lab  09/27/15 1855  AST 5,862*  ALT 3,831*  ALKPHOS 129*  BILITOT 2.9*  ALBUMIN 2.0*    Cardiac Enzymes  Recent Labs Lab 09/27/15 1855 09/28/15 0225  TROPONINI 0.08* 0.33*    Glucose  Recent Labs Lab 09/27/15 1724 09/27/15 1933 09/27/15 2359 09/28/15 0444  GLUCAP 148* 185* 142* 125*    Imaging Dg Chest Port 1 View  Result Date: 09/27/2015 CLINICAL DATA:  Intubation EXAM: PORTABLE CHEST 1 VIEW COMPARISON:  September 27, 2015 FINDINGS: The ETT is in good position as is a left central line. No pneumothorax. Transcutaneous pacers overlie the left chest limiting evaluation. No convincing evidence of focal infiltrate, nodule, or mass. IMPRESSION: The ETT and right  central line are in good position. Electronically Signed   By: Dorise Bullion III M.D   On: 09/27/2015 15:36   Dg Chest Port 1 View  Result Date: 09/27/2015 CLINICAL DATA:  Patient has been feeling well today. Postop right humerus, low BP. Tightness in the chest with shortness of breath and wheezing. EXAM: PORTABLE CHEST 1 VIEW COMPARISON:  01/23/2014 FINDINGS: Semi-erect portable view of the chest. There is mild consolidation now present in the left lower lobe with mild air bronchograms, suspicious for infiltrate. There is trace left-sided pleural effusion. A small amount of linear atelectasis is present in the right midlung zone. Heart size is mildly enlarged. There is atherosclerosis of the aorta. No pneumothorax. Old bilateral left greater than right rib fractures. There is post traumatic deformity of the proximal left humerus as before. IMPRESSION: Mild consolidation in the left retrocardiac space with mild air bronchograms and small effusion. Findings are suggestive of pneumonia. Mild cardiomegaly with slight central vascular congestion compared to prior. Atherosclerotic vascular disease of the aorta Electronically Signed   By: Donavan Foil M.D.   On: 09/27/2015 14:48   STUDIES:    CULTURES: Blood 9/10 > Sputum 9/10 > MRSA 9/10 >  ANTIBIOTICS: Levaquin 9/10 > Vanc 9/10 >   SIGNIFICANT EVENTS: 9/7 admit after fall. Had repair of right shoulder surgery. 9/10 PCCM consulted for hypotension, bradycardia. Intubated for airway.  LINES/TUBES: L Ij 9/10>>> ETT 9/11>>>  DISCUSSION: 18 female, admitted after a fall, had repair of her right shoulder on 9/7, don't have chronic atrial fibrillation, becoming hypotensive overnight and noted to be bradycardic today. Was diaphoretic. Noted to be junctional rhythm with nonspecific ST-T wave changes. Intubated for airway protection.   ASSESSMENT / PLAN:  PULMONARY A: Acute hypoxemic respiratory failure 2/2 unable to protect her airway, concern  for sepsis, concern for NSTEMI/demand ischemia P:   Full vent support. Abx for ?RLL infiltrate. Continue vent support for now. Pancultrue. Cardiology has been consulted re: bradycardia and hypotension feel a combination of beta blockers and CCB.  CARDIOVASCULAR A:  Bradycardia. Hypotension.  Could be related to metoprolol and calcium channel blocker received. Patient is on chronic  metoprolol.  Concern for demand ischemia given EKG changes.  Concern for sepsis/PNA but CXR is not too overwhelming.  Chronic Atrial fibrillation. P:  Cards has been consulted, continue to hold BB and CBB. Will defer to cards re: cardiac meds and heparin.  Check troponin and echo per cards Hold NOAC Cont vasopressors > wean as able. Cortisol level now Stress dose steroids.  RENAL A:   Acute renal failure Hyperkalemia Oliguria P:   Check lytes Replace electrolytes as indicated IVF resuscitation CVP 14, continue to follow  GASTROINTESTINAL A:   Shocked liver, LFT in the 5000 P:   NPO for now  LFTs in AM Maintain hemodynamics PPI Viral hepatitis pane  HEMATOLOGIC A:   INR of 6.9 but got NOAC yesterday P:  F/U CBC INR now. INR in AM Hold vitamin K for now (not bleeding anywhere). See GI section above.  INFECTIOUS A:   LLL PNA. Possible sepsis vs drug induced hypotension vs cardiac/NSTEMI P:   Pancultrue. Levaquin and Vancomycin.  F/U on culture  ENDOCRINE A:   No issues P:   NPO SSI   NEUROLOGIC A:   Anxiety/pain P:   RASS goal: 0- -1 Fentanyl drip and versed prn  FAMILY  - Updates: No family bedside.  - Inter-disciplinary family meet or Palliative Care meeting due by:  10/04/15  The patient is critically ill with multiple organ systems failure and requires high complexity decision making for assessment and support, frequent evaluation and titration of therapies, application of advanced monitoring technologies and extensive interpretation of multiple databases.    Critical Care Time devoted to patient care services described in this note is  45  Minutes. This time reflects time of care of this signee Dr Jennet Maduro. This critical care time does not reflect procedure time, or teaching time or supervisory time of PA/NP/Med student/Med Resident etc but could involve care discussion time.  Rush Farmer, M.D. [facility] Pulmonary/Critical Care Medicine. Pager: [phone]. After hours pager: [phone].  09/28/2015, 7:04 AM

## 2015-09-28 NOTE — Progress Notes (Signed)
Pharmacy Antibiotic Note Cristina [name] is a 75 y.o. female admitted on 09/24/2015 with pneumonia.  Pharmacy has been consulted for Levaquin and vancomycin dosing. Renal function has declined over the last couple of days and antibiotic dose adjustments are warranted.  Plan: Vancomycin 1000mg  every 24 hours Levaquin 750 mg IV every 48 hours  Follow renal function closely, LOT, and culture data  Height: 5\' 4"  (162.6 cm) Weight: 198 lb 13.7 oz (90.2 kg) IBW/kg (Calculated) : 54.7  Temp (24hrs), Avg:98.1 F (36.7 C), Min:97.6 F (36.4 C), Max:98.7 F (37.1 C)   Recent Labs Lab 09/24/15 0930 09/24/15 2055 09/25/15 0517 09/27/15 1122 09/27/15 1756 09/27/15 1830 09/27/15 1855 09/28/15 0225  WBC 11.5* 12.5* 8.9 12.7*  --  29.0*  --  23.2*  CREATININE 0.73 0.77 0.79  --   --   --  1.78* 1.90*  LATICACIDVEN  --   --   --   --  2.8*  --   --   --     Estimated Creatinine Clearance: 27.4 mL/min (by C-G formula based on SCr of 1.9 mg/dL).    Allergies  Allergen Reactions  . Augmentin [Amoxicillin-Pot Clavulanate] Anaphylaxis and Other (See Comments)    Has patient had a PCN reaction causing immediate rash, facial/tongue/throat swelling, SOB or lightheadedness with hypotension: Yes Has patient had a PCN reaction causing severe rash involving mucus membranes or skin necrosis: No Has patient had a PCN reaction that required hospitalization No Has patient had a PCN reaction occurring within the last 10 years: Yes If all of the above answers are "NO", then may proceed with Cephalosporin use.  [name] Kitchen Lisinopril Anaphylaxis and Cough    Antimicrobials this admission: 9/10 Levaquin  >>  9/10 vancomycin  >>   Dose adjustments this admission: 9/11 Levaquin 750mg  daily to 750mg  every 48 hours 9/11 Vancomycin 750mg  every 12 hours to 1000mg  every 24 hours  Microbiology results: 9/10 BCx:  9/11 RespCx:   Thank you for allowing pharmacy to be a part of this patient's care.  Melburn Popper, PharmD Clinical Pharmacy Resident Pager: [phone] 09/28/15 10:36 AM

## 2015-09-28 NOTE — Progress Notes (Signed)
Initial Nutrition Assessment  DOCUMENTATION CODES:   Obesity unspecified  INTERVENTION:   Recommend Vital High Protein @ 25 ml/hr 60 ml Prostat BID Provides: 600 ml, 1000 kcal, 112 grams protein, and 501 ml H2O.    NUTRITION DIAGNOSIS:   Inadequate oral intake related to inability to eat as evidenced by NPO status.  GOAL:   Provide needs based on ASPEN/SCCM guidelines  MONITOR:   Vent status, Labs, I & O's  REASON FOR ASSESSMENT:   Ventilator    ASSESSMENT:   62 female, admitted after a fall, had repair of her right shoulder on 9/7, don't have chronic atrial fibrillation, becoming hypotensive overnight and noted to be bradycardic today. Was diaphoretic. Noted to be junctional rhythm with nonspecific ST-T wave changes. Intubated for airway protection.   Patient is currently intubated on ventilator support MV: 7.2 L/min Temp (24hrs), Avg:98.2 F (36.8 C), Min:97.9 F (36.6 C), Max:98.7 F (37.1 C)  Medications reviewed and include: solucortef, vitamin D, norepinephrine infusing, phenylephrine off Labs reviewed: PO4 5.5, magnesium 1.6 CBG's: 88-102 Nutrition-Focused physical exam completed. Findings are no fat depletion, no muscle depletion, and no edema.  OG tube tip beyond xray  Pt awake, daughter at bedside and provides hx. Per daughter pt had a good appetite PTA and has had no recent weight changes.   Diet Order:  Diet - low sodium heart healthy Diet general Diet NPO time specified  Skin:  Wound (see comment) (unstageable PU to heel and left leg and elbow incisions)  Last BM:  9/9  Height:   Ht Readings from Last 1 Encounters:  09/25/15 5\' 4"  (1.626 m)    Weight:   Wt Readings from Last 1 Encounters:  09/28/15 198 lb 13.7 oz (90.2 kg)    Ideal Body Weight:  54.5 kg  BMI:  Body mass index is 34.13 kg/m.  Estimated Nutritional Needs:   Kcal:  [id number]  Protein:  >/= 109 grams  Fluid:  > 1.5 L/day  EDUCATION NEEDS:   No education  needs identified at this time  Marianne, [place], [place] Pager [phone] After Hours Pager

## 2015-09-28 NOTE — Progress Notes (Signed)
CRITICAL VALUE ALERT  Critical value received:  TROP .08, INR 5.14  Date of notification:  09/27/15  Time of notification: 1952  Critical value read back:Yes.    Nurse who received alert:  Fredonia Highland, Melene Plan, RN   MD notified : E-Link notified   Time of first page:  2005   Responding MD:  Dr. Oletta Darter   Time MD responded:  2015

## 2015-09-28 NOTE — Progress Notes (Addendum)
   Subjective:  Patient intubated yesterday for respiratory failure  Objective:   VITALS:   Vitals:   09/28/15 0355 09/28/15 0400 09/28/15 0500 09/28/15 0600  BP:  (!) 160/128 117/72   Pulse: 69 69 62 70  Resp: 16 18 14 18   Temp:  98 F (36.7 C)    TempSrc:  Oral    SpO2: 100% 99% 100% 100%  Weight:   90.2 kg (198 lb 13.7 oz)   Height:        Intubated and sedated   Lab Results  Component Value Date   WBC 23.2 (H) 09/28/2015   HGB 10.3 (L) 09/28/2015   HCT 34.3 (L) 09/28/2015   MCV 91.5 09/28/2015   PLT 444 (H) 09/28/2015     Assessment/Plan:  4 Days Post-Op   - Expected postop acute blood loss anemia - will monitor for symptoms - DVT ppx - SCDs, ambulation, lovenox - NWB RUE, RLE - patient is more appropriate for hospitalist service once out of the ICU - follow up in 2 weeks  Marianna Payment 09/28/2015, 7:18 AM [phone]

## 2015-09-28 NOTE — Progress Notes (Signed)
CRITICAL VALUE ALERT  Critical value received:  INR 6.91  Date of notification:  09/27/15  Time of notification:  2301  Critical value read back:Yes.    Nurse who received alert:  Fredonia Highland, RN   MD notified (1st page):  E-Link MD notified, Dr. Oletta Darter   Time: 2305

## 2015-09-28 NOTE — Consult Note (Addendum)
[place] Nurse wound consult note Reason for Consult: Consult requested for left heel.  Family at the bedside states wound has been there over a month. Wound type: Unstageable pressure injury Pressure Ulcer POA: Yes/No Measurement: 2.5X2.5cm Wound bed: 100% dry eschar, no fluctuance, odor or drainage Dressing procedure/placement/frequency: It is best practice to leave dry stable eschar intact.  Heels are floated with Prevalon beets to reduce pressure.  Foam dressing to protect from further injury.  Discussed plan of care with family members at the bedside and they deny further questions. Please re-consult if further assistance is needed.  Thank-you,  Julien Girt MSN, [place], [place], [place], [place]

## 2015-09-29 ENCOUNTER — Inpatient Hospital Stay (HOSPITAL_COMMUNITY): Payer: Medicare Other

## 2015-09-29 DIAGNOSIS — Z452 Encounter for adjustment and management of vascular access device: Secondary | ICD-10-CM

## 2015-09-29 DIAGNOSIS — J189 Pneumonia, unspecified organism: Secondary | ICD-10-CM

## 2015-09-29 LAB — CBC
HEMATOCRIT: 25 % — AB (ref 36.0–46.0)
HEMOGLOBIN: 8 g/dL — AB (ref 12.0–15.0)
MCH: 28.1 pg (ref 26.0–34.0)
MCHC: 32 g/dL (ref 30.0–36.0)
MCV: 87.7 fL (ref 78.0–100.0)
Platelets: 210 10*3/uL (ref 150–400)
RBC: 2.85 MIL/uL — AB (ref 3.87–5.11)
RDW: 15.3 % (ref 11.5–15.5)
WBC: 12.1 10*3/uL — AB (ref 4.0–10.5)

## 2015-09-29 LAB — GLUCOSE, CAPILLARY
GLUCOSE-CAPILLARY: 120 mg/dL — AB (ref 65–99)
GLUCOSE-CAPILLARY: 123 mg/dL — AB (ref 65–99)
Glucose-Capillary: 117 mg/dL — ABNORMAL HIGH (ref 65–99)
Glucose-Capillary: 120 mg/dL — ABNORMAL HIGH (ref 65–99)
Glucose-Capillary: 124 mg/dL — ABNORMAL HIGH (ref 65–99)
Glucose-Capillary: 139 mg/dL — ABNORMAL HIGH (ref 65–99)

## 2015-09-29 LAB — PROTIME-INR
INR: 1.33
INR: 4.52
PROTHROMBIN TIME: 16.5 s — AB (ref 11.4–15.2)
Prothrombin Time: 43 seconds — ABNORMAL HIGH (ref 11.4–15.2)

## 2015-09-29 LAB — HEPATIC FUNCTION PANEL
ALBUMIN: 2.3 g/dL — AB (ref 3.5–5.0)
ALK PHOS: 144 U/L — AB (ref 38–126)
ALT: 2394 U/L — AB (ref 14–54)
AST: 2724 U/L — AB (ref 15–41)
BILIRUBIN DIRECT: 0.6 mg/dL — AB (ref 0.1–0.5)
BILIRUBIN TOTAL: 1.7 mg/dL — AB (ref 0.3–1.2)
Indirect Bilirubin: 1.1 mg/dL — ABNORMAL HIGH (ref 0.3–0.9)
Total Protein: 4.6 g/dL — ABNORMAL LOW (ref 6.5–8.1)

## 2015-09-29 LAB — BASIC METABOLIC PANEL
ANION GAP: 11 (ref 5–15)
BUN: 38 mg/dL — ABNORMAL HIGH (ref 6–20)
CHLORIDE: 108 mmol/L (ref 101–111)
CO2: 21 mmol/L — ABNORMAL LOW (ref 22–32)
Calcium: 8.1 mg/dL — ABNORMAL LOW (ref 8.9–10.3)
Creatinine, Ser: 3.07 mg/dL — ABNORMAL HIGH (ref 0.44–1.00)
GFR, EST AFRICAN AMERICAN: 16 mL/min — AB (ref >60)
GFR, EST NON AFRICAN AMERICAN: 14 mL/min — AB (ref >60)
Glucose, Bld: 118 mg/dL — ABNORMAL HIGH (ref 65–99)
POTASSIUM: 4.4 mmol/L (ref 3.5–5.1)
SODIUM: 140 mmol/L (ref 135–145)

## 2015-09-29 LAB — BLOOD GAS, ARTERIAL
ACID-BASE DEFICIT: 5 mmol/L — AB (ref 0.0–2.0)
BICARBONATE: 19.5 mmol/L — AB (ref 20.0–28.0)
DRAWN BY: 44956
FIO2: 40
LHR: 14 {breaths}/min
MECHVT: 500 mL
O2 SAT: 98.4 %
PEEP/CPAP: 5 cmH2O
PH ART: 7.354 (ref 7.350–7.450)
Patient temperature: 98.6
pCO2 arterial: 35.9 mmHg (ref 32.0–48.0)
pO2, Arterial: 128 mmHg — ABNORMAL HIGH (ref 83.0–108.0)

## 2015-09-29 LAB — PROCALCITONIN
PROCALCITONIN: 5.96 ng/mL
PROCALCITONIN: 7.43 ng/mL

## 2015-09-29 LAB — PREPARE FRESH FROZEN PLASMA
UNIT DIVISION: 0
Unit division: 0

## 2015-09-29 LAB — HEPATITIS PANEL, ACUTE
HCV Ab: <0.1 s/co ratio (ref 0.0–0.9)
HEP A IGM: NEGATIVE
Hep B C IgM: NEGATIVE
Hepatitis B Surface Ag: NEGATIVE

## 2015-09-29 LAB — CREATININE, URINE, RANDOM: CREATININE, URINE: 128.85 mg/dL

## 2015-09-29 LAB — MAGNESIUM: Magnesium: 2.1 mg/dL (ref 1.7–2.4)

## 2015-09-29 LAB — MRSA PCR SCREENING: MRSA by PCR: NEGATIVE

## 2015-09-29 LAB — SODIUM, URINE, RANDOM: Sodium, Ur: 19 mmol/L

## 2015-09-29 LAB — PHOSPHORUS: Phosphorus: 5.7 mg/dL — ABNORMAL HIGH (ref 2.5–4.6)

## 2015-09-29 MED ORDER — SODIUM CHLORIDE 0.9 % FOR CRRT
INTRAVENOUS_CENTRAL | Status: DC | PRN
  Administered 2015-10-03: 04:00:00 via INTRAVENOUS_CENTRAL
  Filled 2015-09-29 (×2): qty 1000

## 2015-09-29 MED ORDER — SODIUM CHLORIDE 0.9 % IV SOLN
Freq: Once | INTRAVENOUS | Status: AC
  Administered 2015-09-29: 12:00:00 via INTRAVENOUS

## 2015-09-29 MED ORDER — VITAMIN K1 10 MG/ML IJ SOLN
5.0000 mg | Freq: Once | INTRAVENOUS | Status: AC
  Administered 2015-09-29: 5 mg via INTRAVENOUS
  Filled 2015-09-29: qty 0.5

## 2015-09-29 MED ORDER — PRISMASOL BGK 4/2.5 32-4-2.5 MEQ/L IV SOLN
INTRAVENOUS | Status: DC
  Administered 2015-09-29 – 2015-10-07 (×53): via INTRAVENOUS_CENTRAL
  Filled 2015-09-29 (×65): qty 5000

## 2015-09-29 MED ORDER — PRISMASOL BGK 4/2.5 32-4-2.5 MEQ/L IV SOLN
INTRAVENOUS | Status: DC
  Administered 2015-09-29 – 2015-10-07 (×17): via INTRAVENOUS_CENTRAL
  Filled 2015-09-29 (×22): qty 5000

## 2015-09-29 MED ORDER — VANCOMYCIN HCL IN DEXTROSE 1-5 GM/200ML-% IV SOLN
1000.0000 mg | INTRAVENOUS | Status: DC
  Filled 2015-09-29: qty 200

## 2015-09-29 MED ORDER — HEPARIN SODIUM (PORCINE) 1000 UNIT/ML DIALYSIS
1000.0000 [IU] | INTRAMUSCULAR | Status: DC | PRN
  Administered 2015-10-07: 2400 [IU] via INTRAVENOUS_CENTRAL
  Filled 2015-09-29: qty 3
  Filled 2015-09-29 (×2): qty 6

## 2015-09-29 MED ORDER — VITAL HIGH PROTEIN PO LIQD
1000.0000 mL | ORAL | Status: DC
Start: 2015-09-29 — End: 2015-10-10
  Administered 2015-09-29 – 2015-10-09 (×10): 1000 mL

## 2015-09-29 MED ORDER — LEVOFLOXACIN IN D5W 500 MG/100ML IV SOLN
500.0000 mg | INTRAVENOUS | Status: DC
  Administered 2015-09-29: 500 mg via INTRAVENOUS
  Filled 2015-09-29: qty 100

## 2015-09-29 MED ORDER — LEVOFLOXACIN IN D5W 250 MG/50ML IV SOLN
250.0000 mg | INTRAVENOUS | Status: AC
  Administered 2015-09-30 – 2015-10-03 (×4): 250 mg via INTRAVENOUS
  Filled 2015-09-29 (×4): qty 50

## 2015-09-29 MED ORDER — PRISMASOL BGK 4/2.5 32-4-2.5 MEQ/L IV SOLN
INTRAVENOUS | Status: DC
  Administered 2015-09-29 – 2015-10-07 (×12): via INTRAVENOUS_CENTRAL
  Filled 2015-09-29 (×13): qty 5000

## 2015-09-29 NOTE — Progress Notes (Signed)
MEDICATION RELATED CONSULT NOTE - INITIAL   Pharmacy Consult for CRRT dose adjustments   Allergies  Allergen Reactions  . Augmentin [Amoxicillin-Pot Clavulanate] Anaphylaxis and Other (See Comments)    Has patient had a PCN reaction causing immediate rash, facial/tongue/throat swelling, SOB or lightheadedness with hypotension: Yes Has patient had a PCN reaction causing severe rash involving mucus membranes or skin necrosis: No Has patient had a PCN reaction that required hospitalization No Has patient had a PCN reaction occurring within the last 10 years: Yes If all of the above answers are "NO", then may proceed with Cephalosporin use.  [name] Kitchen Lisinopril Anaphylaxis and Cough    Patient Measurements: Height: 5\' 4"  (162.6 cm) Weight: 204 lb 5.9 oz (92.7 kg) IBW/kg (Calculated) : 54.7  Vital Signs: Temp: 98.6 F (37 C) (09/12 1930) Temp Source: Oral (09/12 1930) BP: 117/60 (09/12 1900) Pulse Rate: 97 (09/12 1900) Intake/Output from previous day: 09/11 0701 - 09/12 0700 In: 2431.7 [I.V.:1029.2; Blood:1312.5; NG/GT:40; IV Piggyback:50] Out: 271 [Urine:96; Emesis/NG output:175] Intake/Output from this shift: No intake/output data recorded.  Labs:  Recent Labs  09/27/15 1755 09/27/15 1830 09/27/15 1855 09/28/15 0225 09/29/15 0500 09/29/15 1247  WBC  --  29.0*  --  23.2* 12.1*  --   HGB  --  10.9*  --  10.3* 8.0*  --   HCT  --  36.1  --  34.3* 25.0*  --   PLT  --  PLATELET CLUMPS NOTED ON SMEAR, COUNT APPEARS ADEQUATE  --  444* 210  --   APTT 41*  --  46*  --   --   --   CREATININE  --   --  1.78* 1.90* 3.07*  --   LABCREA  --   --   --   --   --  128.85  MG  --   --  1.9 1.6* 2.1  --   PHOS  --   --  6.7* 5.5* 5.7*  --   ALBUMIN  --   --  2.0*  --  2.3*  --   PROT  --   --  5.0*  --  4.6*  --   AST  --   --  [id number]*  --  2,724*  --   ALT  --   --  [id number]*  --  2,394*  --   ALKPHOS  --   --  129*  --  144*  --   BILITOT  --   --  2.9*  --  1.7*  --   BILIDIR  --   --    --   --  0.6*  --   IBILI  --   --   --   --  1.1*  --    Estimated Creatinine Clearance: 17.2 mL/min (by C-G formula based on SCr of 3.07 mg/dL).   Microbiology: Recent Results (from the past 720 hour(s))  Culture, blood (routine x 2)     Status: None (Preliminary result)   Collection Time: 09/27/15  5:58 PM  Result Value Ref Range Status   Specimen Description BLOOD BLOOD LEFT HAND  Final   Special Requests IN PEDIATRIC BOTTLE  1CC  Final   Culture NO GROWTH 2 DAYS  Final   Report Status PENDING  Incomplete  Culture, blood (routine x 2)     Status: None (Preliminary result)   Collection Time: 09/27/15  6:06 PM  Result Value Ref Range Status   Specimen Description BLOOD BLOOD LEFT HAND  Final   Special Requests IN PEDIATRIC BOTTLE  1CC  Final   Culture NO GROWTH 2 DAYS  Final   Report Status PENDING  Incomplete  Culture, respiratory (tracheal aspirate)     Status: None (Preliminary result)   Collection Time: 09/28/15  8:40 AM  Result Value Ref Range Status   Specimen Description TRACHEAL ASPIRATE  Final   Special Requests Normal  Final   Gram Stain   Final    ABUNDANT WBC PRESENT, PREDOMINANTLY PMN FEW SQUAMOUS EPITHELIAL CELLS PRESENT FEW GRAM POSITIVE COCCI IN PAIRS    Culture CULTURE REINCUBATED FOR BETTER GROWTH  Final   Report Status PENDING  Incomplete  MRSA PCR Screening     Status: None   Collection Time: 09/29/15 12:47 PM  Result Value Ref Range Status   MRSA by PCR NEGATIVE NEGATIVE Final    Comment:        The GeneXpert MRSA Assay (FDA approved for NASAL specimens only), is one component of a comprehensive MRSA colonization surveillance program. It is not intended to diagnose MRSA infection nor to guide or monitor treatment for MRSA infections.     Medical History: Past Medical History:  Diagnosis Date  . Anxiety   . Asthma   . Atrial fibrillation ([place])   . Dysrhythmia    PAF  . History of gout   . Hyperlipemia   . Hypertension   . Pneumonia ~  2012  . Shortness of breath dyspnea    with exertion    Assessment: 75yo female on Levofloxacin and Vancomycin for pnuemonia.  Pt with AKI, with Vanc currently on hold,  and now will be starting CRRT.  Dosages will require adjustment; last dose of Vanc was at 0730 this AM.  Plan:  Change Levofloxacin to 250mg  IV q24, next dose at 1800 on 9/13 Start Vancomycin 1000mg  IV q24, next dose at 2000 (24hr after initiation of CRRT) on 9/13   Gracy Bruins, [facility]

## 2015-09-29 NOTE — Progress Notes (Signed)
Made Dr.Sood aware of urine output of only 15cc since 1900

## 2015-09-29 NOTE — Progress Notes (Signed)
Pharmacy Antibiotic Note Cristina [name] is a 75 y.o. female admitted on 09/24/2015 with pneumonia.  Pharmacy has been consulted for Levaquin and vancomycin dosing. Renal function has declined over the last couple of days and antibiotic dose adjustments are warranted.  Plan: -Hold vancomycin and obtain random level with morning labs 9/13 -Reduce levaquin to 500 mg IV every 48 hours  -Follow renal function closely, LOT, and culture data  Height: 5\' 4"  (162.6 cm) Weight: 198 lb 13.7 oz (90.2 kg) IBW/kg (Calculated) : 54.7  Temp (24hrs), Avg:98.1 F (36.7 C), Min:97.6 F (36.4 C), Max:98.5 F (36.9 C)   Recent Labs Lab 09/24/15 2055 09/25/15 0517 09/27/15 1122 09/27/15 1756 09/27/15 1830 09/27/15 1855 09/28/15 0225 09/29/15 0500  WBC 12.5* 8.9 12.7*  --  29.0*  --  23.2* 12.1*  CREATININE 0.77 0.79  --   --   --  1.78* 1.90* 3.07*  LATICACIDVEN  --   --   --  2.8*  --   --   --   --     Estimated Creatinine Clearance: 17 mL/min (by C-G formula based on SCr of 3.07 mg/dL).    Allergies  Allergen Reactions  . Augmentin [Amoxicillin-Pot Clavulanate] Anaphylaxis and Other (See Comments)    Has patient had a PCN reaction causing immediate rash, facial/tongue/throat swelling, SOB or lightheadedness with hypotension: Yes Has patient had a PCN reaction causing severe rash involving mucus membranes or skin necrosis: No Has patient had a PCN reaction that required hospitalization No Has patient had a PCN reaction occurring within the last 10 years: Yes If all of the above answers are "NO", then may proceed with Cephalosporin use.  [name] Kitchen Lisinopril Anaphylaxis and Cough    Antimicrobials this admission: 9/10 Levaquin  >>  9/10 vancomycin  >>   Dose adjustments this admission: 9/11 Levaquin 750mg  daily to 750mg  every 48 hours 9/11 Vancomycin 750mg  every 12 hours to 1000mg  every 24 hours 9/12 Hold vancomycin and obtain random level 9/12 Levaquin 750 mg q48 to 500mg   q48  Microbiology results: 9/10 BCx: ngtd 9/11 RespCx: GPC in pairs, WBC, SEC  Thank you for allowing pharmacy to be a part of this patient's care.  Arrie Senate, PharmD PGY-1 Pharmacy Resident Pager: [phone] 09/29/2015

## 2015-09-29 NOTE — Procedures (Signed)
Hemodialysis Catheter Insertion Procedure Note ZEANA DEYOUNG [id number] [birth year]  Procedure: Insertion of Hemodialysis Catheter Indications: CRRT  Procedure Details Consent: Risks of procedure as well as the alternatives and risks of each were explained to the (patient/caregiver).  Consent for procedure obtained. Time Out: Verified patient identification, verified procedure, site/side was marked, verified correct patient position, special equipment/implants available, medications/allergies/relevent history reviewed, required imaging and test results available.  Performed  Maximum sterile technique was used including antiseptics, cap, gloves, gown, hand hygiene, mask and sheet. Skin prep: Chlorhexidine; local anesthetic administered A antimicrobial bonded/coated triple lumen catheter was placed in the right internal jugular vein using the Seldinger technique.  Evaluation Blood flow good Complications: No apparent complications Patient did tolerate procedure well. Chest X-ray ordered to verify placement.  CXR: pending.  Procedure performed under direct ultrasound guidance for real time vessel cannulation.      Montey Hora, [place] Pgr: [phone]  or [phone] 09/29/2015, 5:27 PM  Rush Farmer, M.D. [facility] Pulmonary/Critical Care Medicine. Pager: [phone]. After hours pager: [phone].

## 2015-09-29 NOTE — Progress Notes (Signed)
Patient is more alert today.  Still intubated.  Stable with ortho issues.  Appreciate cards, CCM assistance with medical issues.   Azucena Cecil, MD [place] 8:20 AM

## 2015-09-29 NOTE — Progress Notes (Signed)
PULMONARY / CRITICAL CARE MEDICINE   Name: Cristina [name] MRN: [id number] DOB: February 23, 1940    ADMISSION DATE:  09/24/2015 CONSULTATION DATE:  09/27/15  REFERRING MD: Dr. Erlinda Hong  CHIEF COMPLAINT:  Pt admitted after a fall. PCCM consulted for hypotension and bradycardia.   HISTORY OF PRESENT ILLNESS:   Cristina [name] is a 75 y.o. female who presents for surgical treatment of right distal humerus fracture.  She denies any changes in medical history. She had a fall on 9/7. Orthopedic did ORIF of Right distal humerus fracture on 9/7.   Patient has chronic atrial fibrillation. She was Xarelto which was discontinued during surgery and resumed post operatively.  The patient was doing okay until last night. She started having low blood pressure which persisted until this morning. Was diaphoretic and complaining of back pain. Rapid response was called. Blood pressure was 60 systolic. Heart rate was in the 50s. Atropine 1 mg IV given. PCCM consulted. EKG done showed junctional rhythm, Q waves in 1 and aVL with t wave inversion as well, new from baseline.  Transferred to [place].  I spoke with Cardiology with concern for active ischemia. Ortho Dr. Erlinda Hong updated.   SUBJECTIVE:  Sedated and intubated, arousable.  VITAL SIGNS: BP 101/67   Pulse 94   Temp 98.2 F (36.8 C) (Oral)   Resp 14   Ht 5\' 4"  (1.626 m)   Wt 92.7 kg (204 lb 5.9 oz)   SpO2 99%   BMI 35.08 kg/m   HEMODYNAMICS: CVP:  [9 mmHg-15 mmHg] 14 mmHg  VENTILATOR SETTINGS: Vent Mode: PRVC FiO2 (%):  [40 %] 40 % Set Rate:  [14 bmp] 14 bmp Vt Set:  [500 mL] 500 mL PEEP:  [5 cmH20] 5 cmH20 Pressure Support:  [5 cmH20] 5 cmH20 Plateau Pressure:  [16 cmH20-17 cmH20] 16 cmH20  INTAKE / OUTPUT: I/O last 3 completed shifts: In: [id number] [I.V.:2163.5; Blood:1312.5; NG/GT:80; IV Piggyback:1450] Out: 81 [Urine:461; Emesis/NG output:175]  PHYSICAL EXAMINATION: General: Patient in respiratory distress. Comfortable postintubation. Neuro :  Cranial nerves grossly intact. No lateralizing signs. HEENT:  PERLA, (-) NVD Cardiovascular:  Good s1/s2. (-) s3/m/r/g Lungs:  Dec BS BLF. Some crackles at bases.  Abdomen:  (+) BS, soft, NT, obese Musculoskeletal:  Gr 1 edema Skin:  Warm extremeities.   LABS:  BMET  Recent Labs Lab 09/27/15 1855 09/28/15 0225 09/29/15 0500  NA 134* 140 140  K 5.2* 4.6 4.4  CL 107 112* 108  CO2 17* 20* 21*  BUN 23* 25* 38*  CREATININE 1.78* 1.90* 3.07*  GLUCOSE 173* 139* 118*   Electrolytes  Recent Labs Lab 09/27/15 1855 09/28/15 0225 09/29/15 0500  CALCIUM 7.6* 7.6* 8.1*  MG 1.9 1.6* 2.1  PHOS 6.7* 5.5* 5.7*   CBC  Recent Labs Lab 09/27/15 1830 09/28/15 0225 09/29/15 0500  WBC 29.0* 23.2* 12.1*  HGB 10.9* 10.3* 8.0*  HCT 36.1 34.3* 25.0*  PLT PLATELET CLUMPS NOTED ON SMEAR, COUNT APPEARS ADEQUATE 444* 210   Coag's  Recent Labs Lab 09/27/15 1755 09/27/15 1855  09/28/15 0709 09/29/15 0415 09/29/15 0500  APTT 41* 46*  --   --   --   --   INR QUESTIONABLE RESULTS, RECOMMEND RECOLLECT TO VERIFY 5.14*  < > 7.88* 1.33 4.52*  < > = values in this interval not displayed.  Sepsis Markers  Recent Labs Lab 09/27/15 1756  09/28/15 0225 09/29/15 0415 09/29/15 0500  LATICACIDVEN 2.8*  --   --   --   --  PROCALCITON  --   < > 7.26 5.96 7.43  < > = values in this interval not displayed.  ABG  Recent Labs Lab 09/27/15 1725 09/28/15 0340 09/29/15 0415  PHART 7.309* 7.285* 7.354  PCO2ART 42.4 38.9 35.9  PO2ART 157.0* 116* 128*   Liver Enzymes  Recent Labs Lab 09/27/15 1855 09/29/15 0500  AST 5,862* 2,724*  ALT 3,831* 2,394*  ALKPHOS 129* 144*  BILITOT 2.9* 1.7*  ALBUMIN 2.0* 2.3*   Cardiac Enzymes  Recent Labs Lab 09/27/15 1855 09/28/15 0225 09/28/15 0730  TROPONINI 0.08* 0.33* 0.32*   Glucose  Recent Labs Lab 09/28/15 1140 09/28/15 1629 09/28/15 2037 09/28/15 2357 09/29/15 0526 09/29/15 0844  GLUCAP 88 108* 110* 120* 120* 139*    Imaging Dg Chest Port 1 View  Result Date: 09/29/2015 CLINICAL DATA:  Intubated patient, status post ORIF of humerus fracture ; hypotension, acute respiratory failure, history of CHF EXAM: PORTABLE CHEST 1 VIEW COMPARISON:  Portable chest x-ray of September 28, 2015 FINDINGS: The lungs are reasonably well inflated. There are persistent bibasilar densities with obscuration of the hemidiaphragms. There small bilateral pleural effusions. The cardiac silhouette remains enlarged. The pulmonary vascularity remains engorged. The endotracheal tube tip lies 3.3 cm above the carina. The esophagogastric tube tip projects below the inferior margin of the image. The left internal jugular venous catheter tip projects over the midportion of the SVC. External pacemaker defibrillator pads are present. IMPRESSION: CHF with mild pulmonary interstitial edema. Bibasilar atelectasis or pneumonia with small bilateral pleural effusions. The pulmonary vascular congestion is more conspicuous today as compared the previous study. Electronically Signed   By: David  Martinique M.D.   On: 09/29/2015 07:31   STUDIES:    CULTURES: Blood 9/10 > Sputum 9/10 > MRSA 9/10 >  ANTIBIOTICS: Levaquin 9/10 > Vanc 9/10 >   SIGNIFICANT EVENTS: 9/7 admit after fall. Had repair of right shoulder surgery. 9/10 PCCM consulted for hypotension, bradycardia. Intubated for airway.  LINES/TUBES: L Ij 9/10>>> ETT 9/11>>>  DISCUSSION: 36 female, admitted after a fall, had repair of her right shoulder on 9/7, don't have chronic atrial fibrillation, becoming hypotensive overnight and noted to be bradycardic today. Was diaphoretic. Noted to be junctional rhythm with nonspecific ST-T wave changes. Intubated for airway protection.   ASSESSMENT / PLAN:  PULMONARY A: Acute hypoxemic respiratory failure 2/2 unable to protect her airway, concern for sepsis, concern for NSTEMI/demand ischemia P:   Full vent support. Abx for ?RLL  infiltrate. Continue vent support for now. Pan-cultrue f/u. Cardiology has been consulted re: bradycardia and hypotension feel a combination of beta blockers and CCB. Needs volume off.  CARDIOVASCULAR A:  Bradycardia. Hypotension.  Could be related to metoprolol and calcium channel blocker received. Patient is on chronic  metoprolol.  Concern for demand ischemia given EKG changes.  Concern for sepsis/PNA but CXR is not too overwhelming.  Chronic Atrial fibrillation. P:  Cards has been consulted, continue to hold BB and CBB. Will defer to cards re: cardiac meds and heparin.  Check troponin and echo per cards Hold NOAC Off pressors and holding for now. Cortisol level now Stress dose steroids.  RENAL A:   Acute renal failure Hyperkalemia Oliguria P:   Check lytes. Replace electrolytes as indicated. KVO IVF. CVP 14, continue to follow.  GASTROINTESTINAL A:   Shocked liver, LFT in the 5000 P:   TF per nutrition LFTs in AM Maintain hemodynamics PPI Viral hepatitis pane  HEMATOLOGIC A:   INR of 6.9 but got NOAC  yesterday P:  F/U CBC. INR now. INR in AM. Vitamin K. See GI section above.  INFECTIOUS A:   LLL PNA. Possible sepsis vs drug induced hypotension vs cardiac/NSTEMI P:   Pancultrue. Levaquin and Vancomycin.  F/U on culture  ENDOCRINE A:   No issues P:   TF per nutrition SSI   NEUROLOGIC A:   Anxiety/pain P:   RASS goal: 0- -1 Fentanyl drip and versed prn  FAMILY  - Updates: Daughter updated bedside, renal consult called.  - Inter-disciplinary family meet or Palliative Care meeting due by:  10/04/15  The patient is critically ill with multiple organ systems failure and requires high complexity decision making for assessment and support, frequent evaluation and titration of therapies, application of advanced monitoring technologies and extensive interpretation of multiple databases.   Critical Care Time devoted to patient care services  described in this note is  45  Minutes. This time reflects time of care of this signee Dr Jennet Maduro. This critical care time does not reflect procedure time, or teaching time or supervisory time of PA/NP/Med student/Med Resident etc but could involve care discussion time.  Rush Farmer, M.D. [facility] Pulmonary/Critical Care Medicine. Pager: [phone]. After hours pager: [phone].  09/29/2015, 11:07 AM

## 2015-09-29 NOTE — Consult Note (Signed)
Reason for Consult: Acute Kidney Injury Referring Physician: Jennet Maduro MD (CCM)  Date of admission: 09/24/2015 Date of consultation: 09/29/15  HPI:  75 year old Caucasian woman with past medical history significant for chronic atrial fibrillation, hypertension, gout and what appears to be normal baseline renal function (creatinine around 0.7). Admitted through the emergency room 5 days ago after suffering a fall and sustaining distal right humerus fracture for which he underwent ORIF. 2 days postoperatively, developed hypotension (with systolics in the 0000000), bradycardia (HR in the 50s)/back pain and diaphoresis for which he was seen by rapid response and then CCM/cardiology for concerns of acute hypoxemic respiratory failure/sepsis and possible ACS based on EKG changes respectively. Since then, it appears that she has developed anuric acute renal failure with creatinine now rising to 3.0. The underlying problem is thought to be sepsis from pneumonia with evidence of shock liver from labs. No evidence of NSAIDs/iodinated intravenous contrast exposure. Antihypertensive agents stopped   08/19/2015  09/24/2015  09/27/2015  09/28/2015  09/29/2015   BUN 17 16 23  (H) 25 (H) 38 (H)  Creatinine 0.77 0.73 1.78 (H) 1.90 (H) 3.07 (H)    Past Medical History:  Diagnosis Date  . Anxiety   . Asthma   . Atrial fibrillation ([place])   . Dysrhythmia    PAF  . History of gout   . Hyperlipemia   . Hypertension   . Pneumonia ~ 2012  . Shortness of breath dyspnea    with exertion    Past Surgical History:  Procedure Laterality Date  . ANKLE FRACTURE SURGERY Right 2000s  . BACK SURGERY    . FRACTURE SURGERY    . [place] SURGERY  1990s  . ORIF ANKLE FRACTURE Left 08/19/2015   Procedure: OPEN REDUCTION INTERNAL FIXATION (ORIF) LEFT ANKLE FRACTURE, POSSIBLE SYNDESMOSIS;  Surgeon: Leandrew Koyanagi, MD;  Location: [facility];  Service: Orthopedics;  Laterality: Left;  . ORIF HUMERUS FRACTURE Right 09/24/2015   Procedure:  OPEN REDUCTION INTERNAL FIXATION (ORIF) RIGHT DISTAL HUMERUS FRACTURE;  Surgeon: Leandrew Koyanagi, MD;  Location: [facility];  Service: Orthopedics;  Laterality: Right;  . TONSILLECTOMY AND ADENOIDECTOMY  1950s    Family History  Problem Relation Age of Onset  . CAD  78    father-mulitple  . Emphysema      mother  . Diabetes Mellitus II      father    Social History:  reports that she has never smoked. She has never used smokeless tobacco. She reports that she drinks alcohol. She reports that she does not use drugs.  Allergies:  Allergies  Allergen Reactions  . Augmentin [Amoxicillin-Pot Clavulanate] Anaphylaxis and Other (See Comments)    Has patient had a PCN reaction causing immediate rash, facial/tongue/throat swelling, SOB or lightheadedness with hypotension: Yes Has patient had a PCN reaction causing severe rash involving mucus membranes or skin necrosis: No Has patient had a PCN reaction that required hospitalization No Has patient had a PCN reaction occurring within the last 10 years: Yes If all of the above answers are "NO", then may proceed with Cephalosporin use.  [name] Kitchen Lisinopril Anaphylaxis and Cough    Medications:  Scheduled: . sodium chloride   Intravenous Once  . sodium chloride   Intravenous Once  . aspirin  81 mg Oral Daily  . budesonide (PULMICORT) nebulizer solution  0.5 mg Nebulization BID  . chlorhexidine  15 mL Mouth Rinse BID  . citalopram  20 mg Oral QHS  . hydrocortisone sodium  succinate  50 mg Intravenous Q6H  . insulin aspart  0-20 Units Subcutaneous Q4H  . ipratropium-albuterol  3 mL Nebulization QID  . levofloxacin (LEVAQUIN) IV  500 mg Intravenous Q48H  . mouth rinse  15 mL Mouth Rinse QID  . pantoprazole sodium  40 mg Per Tube Daily  . phytonadione (VITAMIN K) IV  5 mg Intravenous Once  . pramipexole  2 mg Oral QHS  . sodium chloride  250 mL Intravenous Once  . sodium chloride flush  10-40 mL Intracatheter Q12H  . [START ON 09/30/2015] Vitamin D  (Ergocalciferol)  50,000 Units Oral Q7 days    BMP Latest Ref Rng & Units 09/29/2015 09/28/2015 09/27/2015  Glucose 65 - 99 mg/dL 118(H) 139(H) 173(H)  BUN 6 - 20 mg/dL 38(H) 25(H) 23(H)  Creatinine 0.44 - 1.00 mg/dL 3.07(H) 1.90(H) 1.78(H)  Sodium 135 - 145 mmol/L 140 140 134(L)  Potassium 3.5 - 5.1 mmol/L 4.4 4.6 5.2(H)  Chloride 101 - 111 mmol/L 108 112(H) 107  CO2 22 - 32 mmol/L 21(L) 20(L) 17(L)  Calcium 8.9 - 10.3 mg/dL 8.1(L) 7.6(L) 7.6(L)   CBC Latest Ref Rng & Units 09/29/2015 09/28/2015 09/27/2015  WBC 4.0 - 10.5 K/uL 12.1(H) 23.2(H) 29.0(H)  Hemoglobin 12.0 - 15.0 g/dL 8.0(L) 10.3(L) 10.9(L)  Hematocrit 36.0 - 46.0 % 25.0(L) 34.3(L) 36.1  Platelets 150 - 400 K/uL 210 444(H) PLATELET CLUMPS NOTED ON SMEAR, COUNT APPEARS ADEQUATE    Dg Chest Port 1 View  Result Date: 09/29/2015 CLINICAL DATA:  Intubated patient, status post ORIF of humerus fracture ; hypotension, acute respiratory failure, history of CHF EXAM: PORTABLE CHEST 1 VIEW COMPARISON:  Portable chest x-ray of September 28, 2015 FINDINGS: The lungs are reasonably well inflated. There are persistent bibasilar densities with obscuration of the hemidiaphragms. There small bilateral pleural effusions. The cardiac silhouette remains enlarged. The pulmonary vascularity remains engorged. The endotracheal tube tip lies 3.3 cm above the carina. The esophagogastric tube tip projects below the inferior margin of the image. The left internal jugular venous catheter tip projects over the midportion of the SVC. External pacemaker defibrillator pads are present. IMPRESSION: CHF with mild pulmonary interstitial edema. Bibasilar atelectasis or pneumonia with small bilateral pleural effusions. The pulmonary vascular congestion is more conspicuous today as compared the previous study. Electronically Signed   By: David  Martinique M.D.   On: 09/29/2015 07:31   Dg Chest Port 1 View  Result Date: 09/28/2015 CLINICAL DATA:  Respiratory failure, history of  asthma, atrial fibrillation, recent ORIF of a humeral fracture. EXAM: PORTABLE CHEST 1 VIEW COMPARISON:  Portable chest x-ray of September 27, 2015 FINDINGS: The lungs are less well inflated on the current study. There is bibasilar density consistent with atelectasis and small pleural effusions more conspicuous today. The pulmonary interstitial markings are slightly increased overall as well. The cardiac silhouette remains enlarged. There is calcification in the wall of the aortic arch. External pacemaker defibrillator pads are in stable position. The endotracheal tube tip lies 3.7 cm above the carina. The esophagogastric tube tip projects below the inferior margin of the image. The left internal jugular venous catheter tip projects over the midportion of the SVC. IMPRESSION: Mild interval deterioration in the appearance of the chest. Increased bibasilar atelectasis or infiltrate is present with small bilateral pleural effusions. No pneumothorax. Stable mild pulmonary vascular congestion with mild cardiomegaly. Aortic atherosclerosis. The support tubes are in reasonable position. Electronically Signed   By: David  Martinique M.D.   On: 09/28/2015 07:19   Dg  Chest Port 1 View  Result Date: 09/27/2015 CLINICAL DATA:  Intubation EXAM: PORTABLE CHEST 1 VIEW COMPARISON:  September 27, 2015 FINDINGS: The ETT is in good position as is a left central line. No pneumothorax. Transcutaneous pacers overlie the left chest limiting evaluation. No convincing evidence of focal infiltrate, nodule, or mass. IMPRESSION: The ETT and right central line are in good position. Electronically Signed   By: Dorise Bullion III M.D   On: 09/27/2015 15:36   Dg Chest Port 1 View  Result Date: 09/27/2015 CLINICAL DATA:  Patient has been feeling well today. Postop right humerus, low BP. Tightness in the chest with shortness of breath and wheezing. EXAM: PORTABLE CHEST 1 VIEW COMPARISON:  01/23/2014 FINDINGS: Semi-erect portable view of the  chest. There is mild consolidation now present in the left lower lobe with mild air bronchograms, suspicious for infiltrate. There is trace left-sided pleural effusion. A small amount of linear atelectasis is present in the right midlung zone. Heart size is mildly enlarged. There is atherosclerosis of the aorta. No pneumothorax. Old bilateral left greater than right rib fractures. There is post traumatic deformity of the proximal left humerus as before. IMPRESSION: Mild consolidation in the left retrocardiac space with mild air bronchograms and small effusion. Findings are suggestive of pneumonia. Mild cardiomegaly with slight central vascular congestion compared to prior. Atherosclerotic vascular disease of the aorta Electronically Signed   By: Donavan Foil M.D.   On: 09/27/2015 14:48    Review of Systems  Unable to perform ROS: Intubated   Blood pressure 114/82, pulse (!) 106, temperature 98.1 F (36.7 C), temperature source Oral, resp. rate 14, height 5\' 4"  (1.626 m), weight 92.7 kg (204 lb 5.9 oz), SpO2 99 %. Physical Exam  Nursing note and vitals reviewed. Constitutional: She appears well-developed and well-nourished.  Intubated, sedated-awakes on examination  HENT:  Head: Normocephalic and atraumatic.  Nose: Nose normal.  Neck: No JVD present.  Intubated  Cardiovascular: Normal heart sounds.  Exam reveals no friction rub.   No murmur heard. Regular tachycardia  Respiratory: Effort normal and breath sounds normal. She has no wheezes. She has no rales.  Mechanical breath sounds, no distinct rales  GI: Soft. Bowel sounds are normal. She exhibits no distension. There is no tenderness.  Musculoskeletal: She exhibits no edema.  Skin: Skin is warm and dry.    Assessment/Plan: 1. Acute kidney injury: History, timeline of events and available database pointing overwhelmingly to ischemic ATN from profound/sustained hypotension. We'll send off for urine electrolytes-previously done urinalysis  negative for any suspicious-appearing sediment. No acute electrolyte/uremic indication to prompt emergent hemodialysis or intervention however with anuric state and evidence of volume overload on chest x-ray-likely to benefit from early initiation of CRRT with some ultrafiltration to allow extubation (I'm selecting CRRT over conventional hemodialysis because of her transient episodes of hypotension as well as persistent tachycardia).. Will stop colchicine at this time.  2. Hypoxic respiratory failure/sepsis/pneumonia: Ventilator management per CCM, on broad-spectrum antibiotic coverage including levofloxacin. 3. Status post fracture of the humerus and open reduction and internal fixation. Appears to be doing well from a surgical standpoint 4. Elevated LFTs/coagulopathy: Secondary to shock liver, getting FFP infusion to allow for safe dialysis catheter placement. 5. Anemia: Possibly anemia of blood loss following fracture/recent surgery as well as anemia of critical illness. Check iron studies.  Hulda Reddix K. 09/29/2015, 12:14 PM

## 2015-09-30 ENCOUNTER — Inpatient Hospital Stay (HOSPITAL_COMMUNITY): Payer: Medicare Other

## 2015-09-30 DIAGNOSIS — N179 Acute kidney failure, unspecified: Secondary | ICD-10-CM

## 2015-09-30 LAB — BASIC METABOLIC PANEL
ANION GAP: 9 (ref 5–15)
BUN: 33 mg/dL — ABNORMAL HIGH (ref 6–20)
CO2: 25 mmol/L (ref 22–32)
Calcium: 8.5 mg/dL — ABNORMAL LOW (ref 8.9–10.3)
Chloride: 105 mmol/L (ref 101–111)
Creatinine, Ser: 2.78 mg/dL — ABNORMAL HIGH (ref 0.44–1.00)
GFR calc non Af Amer: 15 mL/min — ABNORMAL LOW (ref >60)
GFR, EST AFRICAN AMERICAN: 18 mL/min — AB (ref >60)
GLUCOSE: 100 mg/dL — AB (ref 65–99)
Potassium: 4.7 mmol/L (ref 3.5–5.1)
Sodium: 139 mmol/L (ref 135–145)

## 2015-09-30 LAB — PREPARE FRESH FROZEN PLASMA
UNIT DIVISION: 0
Unit division: 0
Unit division: 0
Unit division: 0

## 2015-09-30 LAB — RENAL FUNCTION PANEL
Albumin: 3.2 g/dL — ABNORMAL LOW (ref 3.5–5.0)
Albumin: 2.9 g/dL — ABNORMAL LOW (ref 3.5–5.0)
Anion gap: 10 (ref 5–15)
Anion gap: 10 (ref 5–15)
BUN: 33 mg/dL — AB (ref 6–20)
BUN: 27 mg/dL — AB (ref 6–20)
CALCIUM: 8.6 mg/dL — AB (ref 8.9–10.3)
CHLORIDE: 106 mmol/L (ref 101–111)
CHLORIDE: 105 mmol/L (ref 101–111)
CO2: 24 mmol/L (ref 22–32)
CO2: 23 mmol/L (ref 22–32)
CREATININE: 2.8 mg/dL — AB (ref 0.44–1.00)
CREATININE: 2.18 mg/dL — AB (ref 0.44–1.00)
Calcium: 8.6 mg/dL — ABNORMAL LOW (ref 8.9–10.3)
GFR calc Af Amer: 18 mL/min — ABNORMAL LOW (ref >60)
GFR calc non Af Amer: 15 mL/min — ABNORMAL LOW (ref >60)
GFR, EST AFRICAN AMERICAN: 24 mL/min — AB (ref >60)
GFR, EST NON AFRICAN AMERICAN: 21 mL/min — AB (ref >60)
Glucose, Bld: 98 mg/dL (ref 65–99)
Glucose, Bld: 96 mg/dL (ref 65–99)
POTASSIUM: 4.8 mmol/L (ref 3.5–5.1)
Phosphorus: 5.3 mg/dL — ABNORMAL HIGH (ref 2.5–4.6)
Phosphorus: 2.7 mg/dL (ref 2.5–4.6)
Potassium: 4.4 mmol/L (ref 3.5–5.1)
SODIUM: 138 mmol/L (ref 135–145)
Sodium: 140 mmol/L (ref 135–145)

## 2015-09-30 LAB — BLOOD GAS, ARTERIAL
ACID-BASE DEFICIT: 4.7 mmol/L — AB (ref 0.0–2.0)
BICARBONATE: 21.9 mmol/L (ref 20.0–28.0)
Drawn by: 449561
FIO2: 40
LHR: 14 {breaths}/min
O2 SAT: 98.1 %
PATIENT TEMPERATURE: 98.6
PCO2 ART: 56.2 mmHg — AB (ref 32.0–48.0)
PEEP: 5 cmH2O
PH ART: 7.216 — AB (ref 7.350–7.450)
VT: 500 mL
pO2, Arterial: 124 mmHg — ABNORMAL HIGH (ref 83.0–108.0)

## 2015-09-30 LAB — GLUCOSE, CAPILLARY
GLUCOSE-CAPILLARY: 100 mg/dL — AB (ref 65–99)
GLUCOSE-CAPILLARY: 91 mg/dL (ref 65–99)
GLUCOSE-CAPILLARY: 94 mg/dL (ref 65–99)
Glucose-Capillary: 106 mg/dL — ABNORMAL HIGH (ref 65–99)
Glucose-Capillary: 108 mg/dL — ABNORMAL HIGH (ref 65–99)
Glucose-Capillary: 71 mg/dL (ref 65–99)
Glucose-Capillary: 76 mg/dL (ref 65–99)
Glucose-Capillary: 98 mg/dL (ref 65–99)

## 2015-09-30 LAB — POCT I-STAT 3, ART BLOOD GAS (G3+)
ACID-BASE DEFICIT: 5 mmol/L — AB (ref 0.0–2.0)
BICARBONATE: 24.8 mmol/L (ref 20.0–28.0)
O2 Saturation: 98 %
PO2 ART: 132 mmHg — AB (ref 83.0–108.0)
Patient temperature: 98.6
TCO2: 27 mmol/L (ref 0–100)
pCO2 arterial: 70.5 mmHg (ref 32.0–48.0)
pH, Arterial: 7.154 — CL (ref 7.350–7.450)

## 2015-09-30 LAB — CBC
HEMATOCRIT: 28.8 % — AB (ref 36.0–46.0)
HEMOGLOBIN: 8.6 g/dL — AB (ref 12.0–15.0)
MCH: 27.1 pg (ref 26.0–34.0)
MCHC: 29.9 g/dL — ABNORMAL LOW (ref 30.0–36.0)
MCV: 90.9 fL (ref 78.0–100.0)
Platelets: 287 10*3/uL (ref 150–400)
RBC: 3.17 MIL/uL — AB (ref 3.87–5.11)
RDW: 15.5 % (ref 11.5–15.5)
WBC: 19.2 10*3/uL — AB (ref 4.0–10.5)

## 2015-09-30 LAB — PHOSPHORUS: PHOSPHORUS: 5.4 mg/dL — AB (ref 2.5–4.6)

## 2015-09-30 LAB — CULTURE, RESPIRATORY W GRAM STAIN: Culture: NORMAL

## 2015-09-30 LAB — PROTIME-INR
INR: 2.55
Prothrombin Time: 27.9 seconds — ABNORMAL HIGH (ref 11.4–15.2)

## 2015-09-30 LAB — HEPATIC FUNCTION PANEL
ALBUMIN: 3.2 g/dL — AB (ref 3.5–5.0)
ALK PHOS: 182 U/L — AB (ref 38–126)
ALT: 2091 U/L — ABNORMAL HIGH (ref 14–54)
AST: 1226 U/L — ABNORMAL HIGH (ref 15–41)
BILIRUBIN INDIRECT: 1.1 mg/dL — AB (ref 0.3–0.9)
Bilirubin, Direct: 0.6 mg/dL — ABNORMAL HIGH (ref 0.1–0.5)
TOTAL PROTEIN: 6.5 g/dL (ref 6.5–8.1)
Total Bilirubin: 1.7 mg/dL — ABNORMAL HIGH (ref 0.3–1.2)

## 2015-09-30 LAB — APTT
APTT: 29 s (ref 24–36)
aPTT: 36 seconds (ref 24–36)

## 2015-09-30 LAB — MAGNESIUM: Magnesium: 2.5 mg/dL — ABNORMAL HIGH (ref 1.7–2.4)

## 2015-09-30 LAB — CULTURE, RESPIRATORY: SPECIAL REQUESTS: NORMAL

## 2015-09-30 LAB — VANCOMYCIN, RANDOM: Vancomycin Rm: 25

## 2015-09-30 MED ORDER — PRO-STAT SUGAR FREE PO LIQD
60.0000 mL | Freq: Two times a day (BID) | ORAL | Status: DC
  Administered 2015-09-30 – 2015-10-10 (×21): 60 mL
  Filled 2015-09-30 (×21): qty 60

## 2015-09-30 MED ORDER — MIDAZOLAM HCL 2 MG/2ML IJ SOLN
1.0000 mg | INTRAMUSCULAR | Status: DC | PRN
  Administered 2015-09-30 – 2015-10-05 (×5): 1 mg via INTRAVENOUS
  Filled 2015-09-30 (×4): qty 2

## 2015-09-30 MED ORDER — VANCOMYCIN HCL IN DEXTROSE 1-5 GM/200ML-% IV SOLN
1000.0000 mg | INTRAVENOUS | Status: DC
  Administered 2015-10-01: 1000 mg via INTRAVENOUS
  Filled 2015-09-30: qty 200

## 2015-09-30 MED ORDER — HEPARIN (PORCINE) IN NACL 100-0.45 UNIT/ML-% IJ SOLN
1250.0000 [IU]/h | INTRAMUSCULAR | Status: DC
  Administered 2015-09-30: 750 [IU]/h via INTRAVENOUS
  Administered 2015-10-01 – 2015-10-04 (×5): 1250 [IU]/h via INTRAVENOUS
  Filled 2015-09-30 (×5): qty 250

## 2015-09-30 NOTE — Progress Notes (Signed)
Nutrition Follow-up  DOCUMENTATION CODES:   Obesity unspecified  INTERVENTION:   Vital High Protein increasing to goal of 25 ml/hr (600 ml/day) via OG tube  Add 60 ml Prostat BID Provides: 1000 kcal, 112 grams protein, and 501 ml H2O.   NUTRITION DIAGNOSIS:   Inadequate oral intake related to inability to eat as evidenced by NPO status. Ongoing.   GOAL:   Provide needs based on ASPEN/SCCM guidelines Progressing.   MONITOR:   TF tolerance, Skin, I & O's, Labs, Vent status  ASSESSMENT:   20 female, admitted after a fall, had repair of her right shoulder on 9/7, don't have chronic atrial fibrillation, becoming hypotensive overnight and noted to be bradycardic today. Was diaphoretic. Noted to be junctional rhythm with nonspecific ST-T wave changes. Intubated for airway protection.   9/12 started CRRT for AKI (per renal likely due to ischemic ATN from profound/sustained hypotension), pt anuric 9/12 TF held when ventilator dyssynchrony continued despite treatment. NGT to LIS 9/13 TF restarted at 10 ml/hr and is advancing as tolerated  Weight has increased to 205 lb (26 lb increase) due to fluid, pt is positive 10.6 L since admission Discussed with RN.   Patient is currently intubated on ventilator support MV: 7.5 L/min Temp (24hrs), Avg:98.1 F (36.7 C), Min:97.1 F (36.2 C), Max:98.6 F (37 C)  Medications reviewed and include: solucortef, vitamin D Labs reviewed: PO4: 5.4, AST 1226, ALT 2091  Diet Order:  Diet - low sodium heart healthy Diet general Diet NPO time specified  Skin:  Wound (see comment) (Wound (see comment) (unstageable PU to heel and left leg and elbow incisions)  Last BM:  9/9  Height:   Ht Readings from Last 1 Encounters:  09/30/15 5\' 4"  (1.626 m)    Weight:   Wt Readings from Last 1 Encounters:  09/30/15 205 lb 11 oz (93.3 kg)    Ideal Body Weight:  54.5 kg  BMI:  Body mass index is 35.31 kg/m.  Estimated Nutritional Needs:    Kcal:  [id number]  Protein:  >/= 109 grams  Fluid:  > 1.5 L/day  EDUCATION NEEDS:   No education needs identified at this time  [place], [place], [place] Pager [phone] After Hours Pager

## 2015-09-30 NOTE — Progress Notes (Signed)
eLink Physician-Brief Progress Note Patient Name: Cristina [name] DOB: 07/07/1940 MRN: [id number]   Date of Service  09/30/2015  HPI/Events of Note  Respiratory Distress - Ventilator dyssynchrony continues. Bedside nurse asked to put patient's NGT to LIS. Dyssynchrony now markedly improved. Will leave enteral nutrition off and NGT to LIS.  eICU Interventions  Will send AM ABG early.      Intervention Category Intermediate Interventions: Respiratory distress - evaluation and management  Sommer,Steven Eugene 09/30/2015, 3:11 AM

## 2015-09-30 NOTE — Progress Notes (Signed)
eLink Physician-Brief Progress Note Patient Name: Cristina [name] DOB: [birth year] MRN: [id number]   Date of Service  09/30/2015  HPI/Events of Note  Ventilator dyssynchrony - Already on a Fentanyl IV infusion and Versed IV PRN.   eICU Interventions  Will order: 1. Empiric trial of already ordered Robaxin. 2. Increase Versed to 1 mg IV Q 1 hour PRN.      Intervention Category Intermediate Interventions: Other:  Lysle Dingwall 09/30/2015, 1:38 AM

## 2015-09-30 NOTE — Progress Notes (Signed)
PULMONARY / CRITICAL CARE MEDICINE   Name: Cristina [name] MRN: [id number] DOB: Jun 22, 1940    ADMISSION DATE:  09/24/2015 CONSULTATION DATE:  09/27/15  REFERRING MD: Dr. Erlinda Hong  CHIEF COMPLAINT:  Pt admitted after a fall. PCCM consulted for hypotension and bradycardia.   HISTORY OF PRESENT ILLNESS:   Cristina [name] is a 75 y.o. female who presents for surgical treatment of right distal humerus fracture.  She denies any changes in medical history. She had a fall on 9/7. Orthopedic did ORIF of Right distal humerus fracture on 9/7.   Patient has chronic atrial fibrillation. She was Xarelto which was discontinued during surgery and resumed post operatively.  The patient was doing okay until last night. She started having low blood pressure which persisted until this morning. Was diaphoretic and complaining of back pain. Rapid response was called. Blood pressure was 60 systolic. Heart rate was in the 50s. Atropine 1 mg IV given. PCCM consulted. EKG done showed junctional rhythm, Q waves in 1 and aVL with t wave inversion as well, new from baseline.  Transferred to [facility].  I spoke with Cardiology with concern for active ischemia. Ortho Dr. Erlinda Hong updated.   SUBJECTIVE:  Arousable and following commands this AM.  CRRT started 9/12.  VITAL SIGNS: BP 101/77   Pulse 85   Temp 97.1 F (36.2 C) (Axillary)   Resp 18   Ht 5\' 4"  (1.626 m)   Wt 93.3 kg (205 lb 11 oz)   SpO2 100%   BMI 35.31 kg/m   HEMODYNAMICS: CVP:  [12 mmHg-19 mmHg] 19 mmHg  VENTILATOR SETTINGS: Vent Mode: PRVC FiO2 (%):  [40 %] 40 % Set Rate:  [14 bmp-24 bmp] 24 bmp Vt Set:  [500 mL] 500 mL PEEP:  [5 cmH20] 5 cmH20 Pressure Support:  [10 cmH20] 10 cmH20 Plateau Pressure:  [16 cmH20-521 cmH20] 17 cmH20  INTAKE / OUTPUT: I/O last 3 completed shifts: In: 2529.3 [I.V.:986; Blood:807; NG/GT:436.3; IV Piggyback:300] Out: [id number] [Urine:147; Emesis/NG output:500; [id number]  PHYSICAL EXAMINATION: General: Chronically ill  appearing elderly female, NAD on vent. Neuro : Awake and following commands. HEENT:  PERLA, (-) NVD. Cardiovascular:  IRIR, Nl s1/s2. -M/R/G. Lungs:  Dec BS BLF. Some crackles at bases.  Abdomen:  (+) BS, soft, NT, obese Musculoskeletal:  Gr 1 edema Skin:  Warm extremeities.   LABS:  BMET  Recent Labs Lab 09/28/15 0225 09/29/15 0500 09/30/15 0400  NA 140 140 139  140  K 4.6 4.4 4.7  4.8  CL 112* 108 105  106  CO2 20* 21* 25  24  BUN 25* 38* 33*  33*  CREATININE 1.90* 3.07* 2.78*  2.80*  GLUCOSE 139* 118* 100*  98   Electrolytes  Recent Labs Lab 09/28/15 0225 09/29/15 0500 09/30/15 0400  CALCIUM 7.6* 8.1* 8.5*  8.6*  MG 1.6* 2.1 2.5*  PHOS 5.5* 5.7* 5.4*  5.3*   CBC  Recent Labs Lab 09/28/15 0225 09/29/15 0500 09/30/15 0400  WBC 23.2* 12.1* 19.2*  HGB 10.3* 8.0* 8.6*  HCT 34.3* 25.0* 28.8*  PLT 444* 210 287   Coag's  Recent Labs Lab 09/27/15 1755 09/27/15 1855  09/29/15 0415 09/29/15 0500 09/30/15 0400  APTT 41* 46*  --   --   --   --   INR QUESTIONABLE RESULTS, RECOMMEND RECOLLECT TO VERIFY 5.14*  < > 1.33 4.52* 2.55  < > = values in this interval not displayed.  Sepsis Markers  Recent Labs Lab 09/27/15 1756  09/28/15 0225 09/29/15  [id number] 09/29/15 0500  LATICACIDVEN 2.8*  --   --   --   --   PROCALCITON  --   < > 7.26 5.96 7.43  < > = values in this interval not displayed.  ABG  Recent Labs Lab 09/29/15 0415 09/30/15 0431 09/30/15 0851  PHART 7.354 7.216* 7.154*  PCO2ART 35.9 56.2* 70.5*  PO2ART 128* 124* 132.0*   Liver Enzymes  Recent Labs Lab 09/27/15 1855 09/29/15 0500 09/30/15 0400  AST 5,862* 2,724* 1,226*  ALT 3,831* 2,394* 2,091*  ALKPHOS 129* 144* 182*  BILITOT 2.9* 1.7* 1.7*  ALBUMIN 2.0* 2.3* 3.2*  3.2*   Cardiac Enzymes  Recent Labs Lab 09/27/15 1855 09/28/15 0225 09/28/15 0730  TROPONINI 0.08* 0.33* 0.32*   Glucose  Recent Labs Lab 09/29/15 1617 09/29/15 1941 09/29/15 2335  09/29/15 2339 09/30/15 0412 09/30/15 0723  GLUCAP 124* 123* 100* 108* 94 71   Imaging Dg Chest Port 1 View  Result Date: 09/30/2015 CLINICAL DATA:  Endotracheal tube evaluation.  Respiratory failure. EXAM: PORTABLE CHEST 1 VIEW COMPARISON:  Chest radiograph 09/29/2015 FINDINGS: Endotracheal tube tip is in unchanged position at the level of the clavicular heads. Right IJ central venous catheter tip overlies the lower SVC. Left IJ approach central venous catheter tip also overlies the lower SVC. Enteric tube courses beyond the field of view. There is persistent shallow lung inflation with bilateral pleural effusions, left-greater-than-right. No pneumothorax. Left retrocardiac consolidation and streaky right basilar opacities. IMPRESSION: 1. Unchanged support apparatus. 2. Persistent bilateral small pleural effusions and associated atelectasis. 3. Unchanged left retrocardiac consolidation. Electronically Signed   By: Ulyses Jarred M.D.   On: 09/30/2015 05:53   Dg Chest Port 1 View  Result Date: 09/29/2015 CLINICAL DATA:  Central line placement EXAM: PORTABLE CHEST 1 VIEW COMPARISON:  09/29/2015 FINDINGS: Right jugular central venous catheter with the tip projecting over the SVC. Left jugular central venous catheter projecting over the SVC. Nasogastric tube coursing below the diaphragm. Endotracheal tube with the tip 2.4 cm above the carina. Small left pleural effusion. Bilateral mild interstitial thickening. No pneumothorax. Stable cardiomegaly. No acute osseous abnormality. Severe osteoarthritis of the left glenohumeral joint. IMPRESSION: 1. Endotracheal tube with the tip 2.4 cm above the carina. 2. Right jugular central venous catheter with the tip projecting over the SVC. Electronically Signed   By: Kathreen Devoid   On: 09/29/2015 18:40   STUDIES:    CULTURES: Blood 9/10 > Sputum 9/10 > MRSA 9/10 >  ANTIBIOTICS: Levaquin 9/10 > Vanc 9/10 >   SIGNIFICANT EVENTS: 9/7 admit after fall. Had  repair of right shoulder surgery. 9/10 PCCM consulted for hypotension, bradycardia. Intubated for airway.  LINES/TUBES: L Ij 9/10>>> ETT 9/11>>> R IJ HD catheter 9/12>>>  DISCUSSION: 27 female, admitted after a fall, had repair of her right shoulder on 9/7, don't have chronic atrial fibrillation, becoming hypotensive overnight and noted to be bradycardic today. Was diaphoretic. Noted to be junctional rhythm with nonspecific ST-T wave changes. Intubated for airway protection.   ASSESSMENT / PLAN:  PULMONARY A: Acute hypoxemic respiratory failure 2/2 unable to protect her airway, concern for sepsis, concern for NSTEMI/demand ischemia P:   Begin PS trials but no extubation til volume off. Abx for RLL infiltrate. Pan-cultrue f/u. Cardiology has been consulted re: bradycardia and hypotension feel a combination of beta blockers and CCB.  CARDIOVASCULAR A:  Bradycardia. Hypotension.  Could be related to metoprolol and calcium channel blocker received. Patient is on chronic  metoprolol.  Concern for demand  ischemia given EKG changes.  Concern for sepsis/PNA but CXR is not too overwhelming.  Chronic Atrial fibrillation. P:  Cards has been consulted, continue to hold BB and CBB. Will defer to cards re: cardiac meds. Restart heparin drip per pharmacy.  Hold NOAC Off pressors and holding for now. Cortisol level 14.3, stress dose steroids.  RENAL A:   Acute renal failure Hyperkalemia Oliguria P:   Check lytes. Replace electrolytes as indicated. KVO IVF. CVP elevated, continue to follow. CRRT with -150 ml/hr.  GASTROINTESTINAL A:   Shocked liver, LFT in the 5000 P:   TF per nutrition. LFTs in AM. Maintain hemodynamics. PPI. Viral hepatitis panel negative  HEMATOLOGIC A:   INR of 6.9 but got NOAC yesterday P:  F/U CBC. INR in AM. Heparin drip per pharmacy See GI section above.  INFECTIOUS A:   LLL PNA. Possible sepsis vs drug induced hypotension vs  cardiac/NSTEMI P:   Pancultrue. Levaquin and Vancomycin.  F/U on culture.  ENDOCRINE A:   No issues P:   TF per nutrition SSI   NEUROLOGIC A:   Anxiety/pain P:   RASS goal: 0- -1 Fentanyl drip and versed prn  FAMILY  - Updates: No family bedside.  - Inter-disciplinary family meet or Palliative Care meeting due by:  10/04/15  The patient is critically ill with multiple organ systems failure and requires high complexity decision making for assessment and support, frequent evaluation and titration of therapies, application of advanced monitoring technologies and extensive interpretation of multiple databases.   Critical Care Time devoted to patient care services described in this note is  45  Minutes. This time reflects time of care of this signee Dr Jennet Maduro. This critical care time does not reflect procedure time, or teaching time or supervisory time of PA/NP/Med student/Med Resident etc but could involve care discussion time.  Rush Farmer, M.D. [facility] Pulmonary/Critical Care Medicine. Pager: [phone]. After hours pager: [phone].  09/30/2015, 9:40 AM

## 2015-09-30 NOTE — Progress Notes (Signed)
PT Cancellation Note  Patient Details Name: Cristina [name] MRN: [id number] DOB: 07/14/1940   Cancelled Treatment:    Reason Eval/Treat Not Completed: Medical issues which prohibited therapy (Pt on CVVHD now.  Will sign off.  Please reorder if and when appropriate.)Thanks.   [name] 09/30/2015, 10:50 AM [name] [phone] (pager)

## 2015-09-30 NOTE — Progress Notes (Addendum)
ANTICOAGULATION CONSULT NOTE - Initial Consult  Pharmacy Consult for Heparin Indication: atrial fibrillation  Allergies  Allergen Reactions  . Augmentin [Amoxicillin-Pot Clavulanate] Anaphylaxis and Other (See Comments)    Has patient had a PCN reaction causing immediate rash, facial/tongue/throat swelling, SOB or lightheadedness with hypotension: Yes Has patient had a PCN reaction causing severe rash involving mucus membranes or skin necrosis: No Has patient had a PCN reaction that required hospitalization No Has patient had a PCN reaction occurring within the last 10 years: Yes If all of the above answers are "NO", then may proceed with Cephalosporin use.  [name] Kitchen Lisinopril Anaphylaxis and Cough    Patient Measurements: Height: 5\' 4"  (162.6 cm) Weight: 205 lb 11 oz (93.3 kg) IBW/kg (Calculated) : 54.7 Heparin Dosing Weight: 75 kg  Vital Signs: Temp: 97.1 F (36.2 C) (09/13 0933) Temp Source: Axillary (09/13 0933) BP: 101/77 (09/13 0900) Pulse Rate: 85 (09/13 0700)  Labs:  Recent Labs  09/27/15 1755  09/27/15 1855  09/28/15 0225  09/28/15 0730 09/29/15 0415 09/29/15 0500 09/30/15 0400  HGB  --   < >  --   --  10.3*  --   --   --  8.0* 8.6*  HCT  --   < >  --   --  34.3*  --   --   --  25.0* 28.8*  PLT  --   < >  --   --  444*  --   --   --  210 287  APTT 41*  --  46*  --   --   --   --   --   --   --   LABPROT 43.9*  --  48.9*  < >  --   < >  --  16.5* 43.0* 27.9*  INR QUESTIONABLE RESULTS, RECOMMEND RECOLLECT TO VERIFY  --  5.14*  < >  --   < >  --  1.33 4.52* 2.55  CREATININE  --   < > 1.78*  --  1.90*  --   --   --  3.07* 2.78*  2.80*  TROPONINI  --   --  0.08*  --  0.33*  --  0.32*  --   --   --   < > = values in this interval not displayed.  Estimated Creatinine Clearance: 19.1 mL/min (by C-G formula based on SCr of 2.78 mg/dL (H)).   Medical History: Past Medical History:  Diagnosis Date  . Anxiety   . Asthma   . Atrial fibrillation ([place])   . Dysrhythmia     PAF  . History of gout   . Hyperlipemia   . Hypertension   . Pneumonia ~ 2012  . Shortness of breath dyspnea    with exertion    Assessment: 75 yoF on Xarelto PTA for AFib discontinued in setting of shock liver (last dose 9/10) after ORIF procedure. Pt currently in AFib and receiving CRRT without coagulation. Will start heparin gtt at low dose and titrate with initial aPTT monitoring given patient has been on Xarelto. Hgb 8.6, plt 287, no S/Sx bleeding noted, baseline aPTT 29s. Will monitor 8-hr aPTT and starting 9/14 will monitor heparin level and aPTT until they correlate, then switch to heparin levels only.  Goal of Therapy:  aPTT 66-102 seconds Monitor platelets by anticoagulation protocol: Yes   Plan:  Start heparin infusion at 750 units/hr  aPTT in 8 hours at 1900 Daily CBC, aPTT, heparin level, S/Sx bleeding  Legrand Como  Biagio Quint, PharmD PGY-1 Pharmacy Resident Pager: [phone]

## 2015-09-30 NOTE — Progress Notes (Signed)
RT called by RN reference to ventilator constantly alarming.  Upon entering room, peak pressure alarm was sounding.  Vent noted to be dumping breath due to excessive peak pressure.  SpO2 stable.  Pt had bite block placed during last round due to biting on the tube.  At this encounter patient was not biting ETT.  Suction catheter passed without difficulty.  Air exchange noted equally in all lung fields.  BVM ventilations attempted by RT with high resistance.  It was noted that patient would tense up her abdominal muscles and force back against ventilation, except for every ~10 seconds, where she would take a large volume breath.  Situation discussed with RN, who would keep increasing sedation and communicate with MD.  RT will continue to monitor.

## 2015-09-30 NOTE — Progress Notes (Signed)
ANTICOAGULATION CONSULT NOTE - Follow Up Consult  Pharmacy Consult for Heparin Indication: atrial fibrillation  Allergies  Allergen Reactions  . Augmentin [Amoxicillin-Pot Clavulanate] Anaphylaxis and Other (See Comments)    Has patient had a PCN reaction causing immediate rash, facial/tongue/throat swelling, SOB or lightheadedness with hypotension: Yes Has patient had a PCN reaction causing severe rash involving mucus membranes or skin necrosis: No Has patient had a PCN reaction that required hospitalization No Has patient had a PCN reaction occurring within the last 10 years: Yes If all of the above answers are "NO", then may proceed with Cephalosporin use.  [name] Kitchen Lisinopril Anaphylaxis and Cough    Patient Measurements: Height: 5\' 4"  (162.6 cm) Weight: 205 lb 11 oz (93.3 kg) IBW/kg (Calculated) : 54.7 Heparin Dosing Weight: 75 kg  Vital Signs: Temp: 97.4 F (36.3 C) (09/13 2000) Temp Source: Oral (09/13 2000) BP: 141/89 (09/13 2030) Pulse Rate: 111 (09/13 2030)  Labs:  Recent Labs  09/28/15 0225  09/28/15 0730 09/29/15 0415 09/29/15 0500 09/30/15 0400 09/30/15 1030 09/30/15 1600 09/30/15 1900  HGB 10.3*  --   --   --  8.0* 8.6*  --   --   --   HCT 34.3*  --   --   --  25.0* 28.8*  --   --   --   PLT 444*  --   --   --  210 287  --   --   --   APTT  --   --   --   --   --   --  29  --  36  LABPROT  --   < >  --  16.5* 43.0* 27.9*  --   --   --   INR  --   < >  --  1.33 4.52* 2.55  --   --   --   CREATININE 1.90*  --   --   --  3.07* 2.78*  2.80*  --  2.18*  --   TROPONINI 0.33*  --  0.32*  --   --   --   --   --   --   < > = values in this interval not displayed.  Estimated Creatinine Clearance: 24.3 mL/min (by C-G formula based on SCr of 2.18 mg/dL (H)).   Medications:  Infusions:  . feeding supplement (VITAL HIGH PROTEIN) 1,000 mL (09/30/15 1659)  . fentaNYL infusion INTRAVENOUS 100 mcg/hr (09/30/15 2000)  . heparin 750 Units/hr (09/30/15 2000)  .  norepinephrine (LEVOPHED) Adult infusion Stopped (09/29/15 0700)  . phenylephrine (NEO-SYNEPHRINE) Adult infusion Stopped (09/29/15 0700)  . dialysis replacement fluid (prismasate) 500 mL/hr at 09/30/15 1843  . dialysis replacement fluid (prismasate) 300 mL/hr at 09/30/15 1843  . dialysate (PRISMASATE) 1,500 mL/hr at 09/30/15 1511    Assessment: 75 year old female on Xarelto PTA for atrial fibrillation now changed to IV heparin in setting of acute shock liver and CRRT. Patient noted to be high bleed risk and dosing cautiously.   Initial aPTT is 36 on 750 units/hr.  Last hemoglobin 8.6 (up from 9/12 after transfusion).  Platelets up to 287.  Discussed with RN - no bleeding noted. Patient is off pressors and BP is stable for now. CRRT is running without issues.   Goal of Therapy:  aPTT 66-102 seconds Monitor platelets by anticoagulation protocol: Yes   Plan:  Increase heparin (no bolus) to 1000 units/hr. Recheck aPTT in 8 hours - ok to do with AM labs.  Daily aPTT, CBC, and heparin level. Monitor signs and symptoms of bleeding.   Sloan Leiter, PharmD, BCPS Clinical Pharmacist [phone] 09/30/2015,9:24 PM

## 2015-09-30 NOTE — Progress Notes (Addendum)
SUBJECTIVE:  Intubated and sedate  OBJECTIVE:   Vitals:   Vitals:   09/30/15 1134 09/30/15 1157 09/30/15 1200 09/30/15 1300  BP:  116/89 125/86 117/85  Pulse:  (!) 112 (!) 109 93  Resp:   (!) 21 (!) 21  Temp:   97.5 F (36.4 C)   TempSrc:   Oral   SpO2: 100% 100% 100% 100%  Weight:      Height:       I&O's:   Intake/Output Summary (Last 24 hours) at 09/30/15 1339 Last data filed at 09/30/15 1300  Gross per 24 hour  Intake          1782.52 ml  Output             2402 ml  Net          -619.48 ml   TELEMETRY: Reviewed telemetry pt in atrial fibrillation with CV:     PHYSICAL EXAM General: sedated and intubated  Lungs:   Scattered rhonchi anteriorly Heart:  Irregularly irregular S1 S2 Pulses are 2+ & equal. Abdomen: Bowel sounds are positive, abdomen soft and non-tender without masses  Msk:  Back normal, normal gait. Normal strength and tone for age. Extremities:   No clubbing, cyanosis or edema.  DP +1 Neuro: sedated Psych:  sedated   LABS: Basic Metabolic Panel:  Recent Labs  09/29/15 0500 09/30/15 0400  NA 140 139  140  K 4.4 4.7  4.8  CL 108 105  106  CO2 21* 25  24  GLUCOSE 118* 100*  98  BUN 38* 33*  33*  CREATININE 3.07* 2.78*  2.80*  CALCIUM 8.1* 8.5*  8.6*  MG 2.1 2.5*  PHOS 5.7* 5.4*  5.3*   Liver Function Tests:  Recent Labs  09/29/15 0500 09/30/15 0400  AST 2,724* 1,226*  ALT 2,394* 2,091*  ALKPHOS 144* 182*  BILITOT 1.7* 1.7*  PROT 4.6* 6.5  ALBUMIN 2.3* 3.2*  3.2*   No results for input(s): LIPASE, AMYLASE in the last 72 hours. CBC:  Recent Labs  09/27/15 1830  09/29/15 0500 09/30/15 0400  WBC 29.0*  < > 12.1* 19.2*  NEUTROABS 26.9*  --   --   --   HGB 10.9*  < > 8.0* 8.6*  HCT 36.1  < > 25.0* 28.8*  MCV 92.1  < > 87.7 90.9  PLT PLATELET CLUMPS NOTED ON SMEAR, COUNT APPEARS ADEQUATE  < > 210 287  < > = values in this interval not displayed. Cardiac Enzymes:  Recent Labs  09/27/15 1855 09/28/15 0225  09/28/15 0730  TROPONINI 0.08* 0.33* 0.32*   BNP: Invalid input(s): POCBNP D-Dimer: No results for input(s): DDIMER in the last 72 hours. Hemoglobin A1C: No results for input(s): HGBA1C in the last 72 hours. Fasting Lipid Panel: No results for input(s): CHOL, HDL, LDLCALC, TRIG, CHOLHDL, LDLDIRECT in the last 72 hours. Thyroid Function Tests: No results for input(s): TSH, T4TOTAL, T3FREE, THYROIDAB in the last 72 hours.  Invalid input(s): FREET3 Anemia Panel: No results for input(s): VITAMINB12, FOLATE, FERRITIN, TIBC, IRON, RETICCTPCT in the last 72 hours. Coag Panel:   Lab Results  Component Value Date   INR 2.55 09/30/2015   INR 4.52 (HH) 09/29/2015   INR 1.33 09/29/2015    RADIOLOGY: Dg Elbow 2 Views Right  Result Date: 09/24/2015 CLINICAL DATA:  ORIF. EXAM: RIGHT ELBOW - 2 VIEW; DG C-ARM GT 120 MIN COMPARISON:  CT 09/15/2015. FINDINGS: ORIF distal humeral fracture. Hardware intact. Anatomic alignment. ORIF proximal  ulna . Hardware intact. Anatomic alignment. Two images obtained. 0 minutes 23 seconds fluoroscopy time. IMPRESSION: Postsurgical changes about the right elbow . Electronically Signed   By: Marcello Moores  Register   On: 09/24/2015 16:19   Dg Elbow 2 Views Right  Result Date: 09/15/2015 CLINICAL DATA:  Right elbow pain and bruising posteriorly after a fall today. EXAM: RIGHT ELBOW - 2 VIEW COMPARISON:  None. FINDINGS: Oblique comminuted fracture demonstrated along the distal humeral shaft and extending to the intercondylar region. Fracture lines extend to the medial and lateral condyles. There is impaction and displacement of the distal fracture fragments. No definite dislocation of the elbow joint although limited positioning is available. Moderate size right elbow effusion. IMPRESSION: Comminuted and displaced impacted fractures of the distal right humeral shaft extending into the intercondylar region, involving both medial and lateral condyles. Associated effusion.  Electronically Signed   By: Lucienne Capers M.D.   On: 09/15/2015 19:20   Dg Forearm Right  Result Date: 09/15/2015 CLINICAL DATA:  Fall.  Elbow pain. EXAM: RIGHT FOREARM - 2 VIEW COMPARISON:  None. FINDINGS: Acute oblique fracture of the distal humerus. 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 No forearm fracture is seen. IMPRESSION: 1. Distal humeral fracture. 2. Madelung deformity of the distal radius/ulna. Electronically Signed   By: Van Clines M.D.   On: 09/15/2015 21:38   Ct Elbow Right Wo Contrast  Result Date: 09/16/2015 CLINICAL DATA:  Patient fell wall today at home. Right elbow fractures. EXAM: CT OF THE RIGHT ELBOW WITHOUT CONTRAST; 3-DIMENSIONAL CT IMAGE RENDERING ON ACQUISITION WORKSTATION TECHNIQUE: Multidetector CT imaging was performed according to the standard protocol. Multiplanar CT image reconstructions were also generated. COMPARISON:  Right elbow radiographs 09/15/2015 FINDINGS: Comminuted fractures demonstrated of the distal right humerus. Oblique fracture line begins in the distal shaft and extends towards the intercondylar region. There is mild posterior displacement and overlap of the distal fracture fragments. Transverse fracture lines extend to the medial and lateral epicondyles. Condylar fragments are displaced and distracted. Impaction of the condylar fragments into the distal humerus. The ulnar and radial joints appear preserved without evidence of dislocation at the joints. Fracture lines do not appear to extend to the articular surfaces. Soft tissue swelling and effusion are present. Incidental note of a fracture of the lateral right ribs which could be acute. IMPRESSION: Comminuted fractures of the distal right humerus involving the distal shaft and intercondylar regions. Impaction and distraction of the condylar fragments. Posterior angulation and displacement of the distal shaft fragment. Fracture lines do not appear to be  intraarticular and there is no evidence of dislocation of the joint. Incidental note of a right lateral rib fracture which could be acute. Electronically Signed   By: Lucienne Capers M.D.   On: 09/16/2015 01:02   Ct 3d Recon At Scanner  Result Date: 09/16/2015 CLINICAL DATA:  Patient fell wall today at home. Right elbow fractures. EXAM: CT OF THE RIGHT ELBOW WITHOUT CONTRAST; 3-DIMENSIONAL CT IMAGE RENDERING ON ACQUISITION WORKSTATION TECHNIQUE: Multidetector CT imaging was performed according to the standard protocol. Multiplanar CT image reconstructions were also generated. COMPARISON:  Right elbow radiographs 09/15/2015 FINDINGS: Comminuted fractures demonstrated of the distal right humerus. Oblique fracture line begins in the distal shaft and extends towards the intercondylar region. There is mild posterior displacement and overlap of the distal fracture fragments. Transverse fracture lines extend to the medial and lateral epicondyles. Condylar fragments are displaced and distracted. Impaction of the condylar fragments into the  distal humerus. The ulnar and radial joints appear preserved without evidence of dislocation at the joints. Fracture lines do not appear to extend to the articular surfaces. Soft tissue swelling and effusion are present. Incidental note of a fracture of the lateral right ribs which could be acute. IMPRESSION: Comminuted fractures of the distal right humerus involving the distal shaft and intercondylar regions. Impaction and distraction of the condylar fragments. Posterior angulation and displacement of the distal shaft fragment. Fracture lines do not appear to be intraarticular and there is no evidence of dislocation of the joint. Incidental note of a right lateral rib fracture which could be acute. Electronically Signed   By: Lucienne Capers M.D.   On: 09/16/2015 01:02   Dg Chest Port 1 View  Result Date: 09/30/2015 CLINICAL DATA:  Endotracheal tube evaluation.  Respiratory  failure. EXAM: PORTABLE CHEST 1 VIEW COMPARISON:  Chest radiograph 09/29/2015 FINDINGS: Endotracheal tube tip is in unchanged position at the level of the clavicular heads. Right IJ central venous catheter tip overlies the lower SVC. Left IJ approach central venous catheter tip also overlies the lower SVC. Enteric tube courses beyond the field of view. There is persistent shallow lung inflation with bilateral pleural effusions, left-greater-than-right. No pneumothorax. Left retrocardiac consolidation and streaky right basilar opacities. IMPRESSION: 1. Unchanged support apparatus. 2. Persistent bilateral small pleural effusions and associated atelectasis. 3. Unchanged left retrocardiac consolidation. Electronically Signed   By: Ulyses Jarred M.D.   On: 09/30/2015 05:53   Dg Chest Port 1 View  Result Date: 09/29/2015 CLINICAL DATA:  Central line placement EXAM: PORTABLE CHEST 1 VIEW COMPARISON:  09/29/2015 FINDINGS: Right jugular central venous catheter with the tip projecting over the SVC. Left jugular central venous catheter projecting over the SVC. Nasogastric tube coursing below the diaphragm. Endotracheal tube with the tip 2.4 cm above the carina. Small left pleural effusion. Bilateral mild interstitial thickening. No pneumothorax. Stable cardiomegaly. No acute osseous abnormality. Severe osteoarthritis of the left glenohumeral joint. IMPRESSION: 1. Endotracheal tube with the tip 2.4 cm above the carina. 2. Right jugular central venous catheter with the tip projecting over the SVC. Electronically Signed   By: Kathreen Devoid   On: 09/29/2015 18:40   Dg Chest Port 1 View  Result Date: 09/29/2015 CLINICAL DATA:  Intubated patient, status post ORIF of humerus fracture ; hypotension, acute respiratory failure, history of CHF EXAM: PORTABLE CHEST 1 VIEW COMPARISON:  Portable chest x-ray of September 28, 2015 FINDINGS: The lungs are reasonably well inflated. There are persistent bibasilar densities with  obscuration of the hemidiaphragms. There small bilateral pleural effusions. The cardiac silhouette remains enlarged. The pulmonary vascularity remains engorged. The endotracheal tube tip lies 3.3 cm above the carina. The esophagogastric tube tip projects below the inferior margin of the image. The left internal jugular venous catheter tip projects over the midportion of the SVC. External pacemaker defibrillator pads are present. IMPRESSION: CHF with mild pulmonary interstitial edema. Bibasilar atelectasis or pneumonia with small bilateral pleural effusions. The pulmonary vascular congestion is more conspicuous today as compared the previous study. Electronically Signed   By: David  Martinique M.D.   On: 09/29/2015 07:31   Dg Chest Port 1 View  Result Date: 09/28/2015 CLINICAL DATA:  Respiratory failure, history of asthma, atrial fibrillation, recent ORIF of a humeral fracture. EXAM: PORTABLE CHEST 1 VIEW COMPARISON:  Portable chest x-ray of September 27, 2015 FINDINGS: The lungs are less well inflated on the current study. There is bibasilar density consistent with atelectasis and small  pleural effusions more conspicuous today. The pulmonary interstitial markings are slightly increased overall as well. The cardiac silhouette remains enlarged. There is calcification in the wall of the aortic arch. External pacemaker defibrillator pads are in stable position. The endotracheal tube tip lies 3.7 cm above the carina. The esophagogastric tube tip projects below the inferior margin of the image. The left internal jugular venous catheter tip projects over the midportion of the SVC. IMPRESSION: Mild interval deterioration in the appearance of the chest. Increased bibasilar atelectasis or infiltrate is present with small bilateral pleural effusions. No pneumothorax. Stable mild pulmonary vascular congestion with mild cardiomegaly. Aortic atherosclerosis. The support tubes are in reasonable position. Electronically Signed    By: David  Martinique M.D.   On: 09/28/2015 07:19   Dg Chest Port 1 View  Result Date: 09/27/2015 CLINICAL DATA:  Intubation EXAM: PORTABLE CHEST 1 VIEW COMPARISON:  September 27, 2015 FINDINGS: The ETT is in good position as is a left central line. No pneumothorax. Transcutaneous pacers overlie the left chest limiting evaluation. No convincing evidence of focal infiltrate, nodule, or mass. IMPRESSION: The ETT and right central line are in good position. Electronically Signed   By: Dorise Bullion III M.D   On: 09/27/2015 15:36   Dg Chest Port 1 View  Result Date: 09/27/2015 CLINICAL DATA:  Patient has been feeling well today. Postop right humerus, low BP. Tightness in the chest with shortness of breath and wheezing. EXAM: PORTABLE CHEST 1 VIEW COMPARISON:  01/23/2014 FINDINGS: Semi-erect portable view of the chest. There is mild consolidation now present in the left lower lobe with mild air bronchograms, suspicious for infiltrate. There is trace left-sided pleural effusion. A small amount of linear atelectasis is present in the right midlung zone. Heart size is mildly enlarged. There is atherosclerosis of the aorta. No pneumothorax. Old bilateral left greater than right rib fractures. There is post traumatic deformity of the proximal left humerus as before. IMPRESSION: Mild consolidation in the left retrocardiac space with mild air bronchograms and small effusion. Findings are suggestive of pneumonia. Mild cardiomegaly with slight central vascular congestion compared to prior. Atherosclerotic vascular disease of the aorta Electronically Signed   By: Donavan Foil M.D.   On: 09/27/2015 14:48   Dg Humerus Right  Result Date: 09/15/2015 CLINICAL DATA:  Pain after trauma EXAM: RIGHT HUMERUS - 2+ VIEW COMPARISON:  None. FINDINGS: The distal humeral fracture is again evident, extending into the intercondylar region. This is seen to better advantage on the accompanying elbow radiographs. The remainder of the  humerus is intact. IMPRESSION: Distal humeral fracture with mild apex -anterior angulation. Remainder of the humerus is intact. Electronically Signed   By: Andreas Newport M.D.   On: 09/15/2015 21:44   Dg C-arm Gt 120 Min  Result Date: 09/24/2015 CLINICAL DATA:  ORIF. EXAM: RIGHT ELBOW - 2 VIEW; DG C-ARM GT 120 MIN COMPARISON:  CT 09/15/2015. FINDINGS: ORIF distal humeral fracture. Hardware intact. Anatomic alignment. ORIF proximal ulna . Hardware intact. Anatomic alignment. Two images obtained. 0 minutes 23 seconds fluoroscopy time. IMPRESSION: Postsurgical changes about the right elbow . Electronically Signed   By: Marcello Moores  Register   On: 09/24/2015 16:19    ASSESSMENT AND PLAN:  1. Hypotension/junctional rhythm: This may all be due to sepsis from pneumonia. Bradycardia may be due to beta blockers and calcium channel blockers which have now been stopped.  Telemetry now with Atrial fibrillation with CVR.  She is now off pressors.   2.  Elevated trop  with fairly flat trend most likely secondary to demand ischemia in setting of sepsis from PNA and recent trauma from fall in setting of acute kidney injury. 2D echo with low normal LVF with EF 50-55% and possible HK of the mid inferoseptal/apical septal and apical myocardium and moderate MR, moderately reduced RVF and moderate pulmonary HTN with PASP 13mmHg.  EF in July was 65%. Continue ASA/statin for now.  She did have evidence of coronary calcifications on chest CT last year.  Will consider nuclear stress test once recovered from acute illness.  3. HCAP: Antibiotics to be ordered by hospitalist.  4. Chronic atrial fibrillation - her Xarelto was stopped on 9/10.  INR increased at 2.55 - this has been very erratic (6.91 on 9/10>>7.88>>1.33>>4.52>>2.55).  Discussed with pharmacy and they feel that any effect of Xarelto is now out of her system.  They recommended starting IV Heparin and run APTT on low side of therapeutic.  We did discuss that she may be  auto anticoagulated due to shock liver.  Her LFTs are markedly elevated but trending downward.  Started Heparin per Pulmonary - will need to watch closely for signs of bleeding.   5.  Recent fall with ORIF of distal humeral fx.  The patient is critically ill with multiple organ systems failure and requires high complexity decision making for assessment and support, frequent evaluation and titration of therapies, application of advanced monitoring technologies and extensive interpretation of multiple databases. Critical Care Time devoted to patient care services described in this note independent of APP time is 45 minutes with >50% of time spent in direct patient care.     Fransico Him, MD  09/30/2015  1:39 PM

## 2015-09-30 NOTE — Progress Notes (Signed)
[facility] Progress Note   Assessment/ Plan:   1. Acute kidney injury: History, timeline of events and available database pointing overwhelmingly to ischemic ATN from profound/sustained hypotension. Continue CRRT at this time for volume unloading-no evidence of renal recovery, she remains anuric.  2. Hypoxic respiratory failure/sepsis/pneumonia: Ventilator management per CCM, on broad-spectrum antibiotic coverage including levofloxacin/vanco. 3. Status post fracture of the humerus and open reduction and internal fixation. Appears to be doing well from a surgical standpoint 4. Elevated LFTs/coagulopathy: Secondary to shock liver, s/p FFP infusion tyesterday. 5. Anemia: Possibly anemia of blood loss following fracture/recent surgery as well as anemia of critical illness. Awaiting iron studies.  Subjective:   Problems with tube feeds/increased residual volumes that apparently were interfering with her respiration.    Objective:   BP 105/86 (BP Location: Left Wrist)   Pulse 85   Temp 97.1 F (36.2 C) (Oral)   Resp 14   Ht 5\' 4"  (1.626 m)   Wt 93.3 kg (205 lb 11 oz)   SpO2 100%   BMI 35.31 kg/m   Intake/Output Summary (Last 24 hours) at 09/30/15 [id number] Last data filed at 09/30/15 0800  Gross per 24 hour  Intake          1990.19 ml  Output             1677 ml  Net           313.19 ml   Weight change: 0.6 kg (1 lb 5.2 oz)  Physical Exam: [id number], comfortable on vent CVS: Pulse regular rhythm, S1 and S2 normal Resp: Coarse breath sounds bilaterally, no rales Abd: Soft, obese, nontender Ext: 1-2+ upper and lower extremity edema  Imaging: Dg Chest Port 1 View  Result Date: 09/30/2015 CLINICAL DATA:  Endotracheal tube evaluation.  Respiratory failure. EXAM: PORTABLE CHEST 1 VIEW COMPARISON:  Chest radiograph 09/29/2015 FINDINGS: Endotracheal tube tip is in unchanged position at the level of the clavicular heads. Right IJ central venous catheter tip overlies the  lower SVC. Left IJ approach central venous catheter tip also overlies the lower SVC. Enteric tube courses beyond the field of view. There is persistent shallow lung inflation with bilateral pleural effusions, left-greater-than-right. No pneumothorax. Left retrocardiac consolidation and streaky right basilar opacities. IMPRESSION: 1. Unchanged support apparatus. 2. Persistent bilateral small pleural effusions and associated atelectasis. 3. Unchanged left retrocardiac consolidation. Electronically Signed   By: Ulyses Jarred M.D.   On: 09/30/2015 05:53   Dg Chest Port 1 View  Result Date: 09/29/2015 CLINICAL DATA:  Central line placement EXAM: PORTABLE CHEST 1 VIEW COMPARISON:  09/29/2015 FINDINGS: Right jugular central venous catheter with the tip projecting over the SVC. Left jugular central venous catheter projecting over the SVC. Nasogastric tube coursing below the diaphragm. Endotracheal tube with the tip 2.4 cm above the carina. Small left pleural effusion. Bilateral mild interstitial thickening. No pneumothorax. Stable cardiomegaly. No acute osseous abnormality. Severe osteoarthritis of the left glenohumeral joint. IMPRESSION: 1. Endotracheal tube with the tip 2.4 cm above the carina. 2. Right jugular central venous catheter with the tip projecting over the SVC. Electronically Signed   By: Kathreen Devoid   On: 09/29/2015 18:40   Dg Chest Port 1 View  Result Date: 09/29/2015 CLINICAL DATA:  Intubated patient, status post ORIF of humerus fracture ; hypotension, acute respiratory failure, history of CHF EXAM: PORTABLE CHEST 1 VIEW COMPARISON:  Portable chest x-ray of September 28, 2015 FINDINGS: The lungs are reasonably well inflated. There are persistent bibasilar densities  with obscuration of the hemidiaphragms. There small bilateral pleural effusions. The cardiac silhouette remains enlarged. The pulmonary vascularity remains engorged. The endotracheal tube tip lies 3.3 cm above the carina. The  esophagogastric tube tip projects below the inferior margin of the image. The left internal jugular venous catheter tip projects over the midportion of the SVC. External pacemaker defibrillator pads are present. IMPRESSION: CHF with mild pulmonary interstitial edema. Bibasilar atelectasis or pneumonia with small bilateral pleural effusions. The pulmonary vascular congestion is more conspicuous today as compared the previous study. Electronically Signed   By: David  Martinique M.D.   On: 09/29/2015 07:31    Labs: BMET  Recent Labs Lab 09/24/15 0930 09/24/15 2055 09/25/15 0517 09/27/15 1855 09/28/15 0225 09/29/15 0500 09/30/15 0400  NA 144 142 141 134* 140 140 139  140  K 3.6 3.5 3.3* 5.2* 4.6 4.4 4.7  4.8  CL 105 105 105 107 112* 108 105  106  CO2 29 31 30  17* 20* 21* 25  24  GLUCOSE 100* 103* 99 173* 139* 118* 100*  98  BUN 16 13 12  23* 25* 38* 33*  33*  CREATININE 0.73 0.77 0.79 1.78* 1.90* 3.07* 2.78*  2.80*  CALCIUM 9.4 8.3* 8.3* 7.6* 7.6* 8.1* 8.5*  8.6*  PHOS  --   --   --  6.7* 5.5* 5.7* 5.4*  5.3*   CBC  Recent Labs Lab 09/27/15 1830 09/28/15 0225 09/29/15 0500 09/30/15 0400  WBC 29.0* 23.2* 12.1* 19.2*  NEUTROABS 26.9*  --   --   --   HGB 10.9* 10.3* 8.0* 8.6*  HCT 36.1 34.3* 25.0* 28.8*  MCV 92.1 91.5 87.7 90.9  PLT PLATELET CLUMPS NOTED ON SMEAR, COUNT APPEARS ADEQUATE 444* 210 287    Medications:    . sodium chloride   Intravenous Once  . aspirin  81 mg Oral Daily  . budesonide (PULMICORT) nebulizer solution  0.5 mg Nebulization BID  . chlorhexidine  15 mL Mouth Rinse BID  . citalopram  20 mg Oral QHS  . hydrocortisone sodium succinate  50 mg Intravenous Q6H  . insulin aspart  0-20 Units Subcutaneous Q4H  . ipratropium-albuterol  3 mL Nebulization QID  . levofloxacin (LEVAQUIN) IV  250 mg Intravenous Q24H  . mouth rinse  15 mL Mouth Rinse QID  . pantoprazole sodium  40 mg Per Tube Daily  . pramipexole  2 mg Oral QHS  . sodium chloride  250 mL  Intravenous Once  . sodium chloride flush  10-40 mL Intracatheter Q12H  . vancomycin  1,000 mg Intravenous Q24H  . Vitamin D (Ergocalciferol)  50,000 Units Oral Q7 days   Elmarie Shiley, MD 09/30/2015, 8:33 AM

## 2015-09-30 NOTE — Progress Notes (Signed)
Patient experiencing ventilatory asynchrony since 0000.  Respiratory therapist evaluated patient and advised patient was not breathing with the ventilator verse secretions or biting on the ETT.  Patient outwardly is not fighting the ventilator, not coughing, and not agitated.  RN began to increase fentanyl rate and gave PRN versed.  Patient continues to by asynchronous.  Called [facility] and spoke with MD Oletta Darter, RN checked patient's residuals from tube feeding and there were no residuals.  New orders received from MD to assist in sedating patient for ventilator synchrony.

## 2015-10-01 ENCOUNTER — Inpatient Hospital Stay (HOSPITAL_COMMUNITY): Payer: Medicare Other

## 2015-10-01 DIAGNOSIS — I951 Orthostatic hypotension: Secondary | ICD-10-CM

## 2015-10-01 LAB — GLUCOSE, CAPILLARY
GLUCOSE-CAPILLARY: 110 mg/dL — AB (ref 65–99)
GLUCOSE-CAPILLARY: 89 mg/dL (ref 65–99)
Glucose-Capillary: 106 mg/dL — ABNORMAL HIGH (ref 65–99)
Glucose-Capillary: 103 mg/dL — ABNORMAL HIGH (ref 65–99)
Glucose-Capillary: 96 mg/dL (ref 65–99)

## 2015-10-01 LAB — RENAL FUNCTION PANEL
ALBUMIN: 2.8 g/dL — AB (ref 3.5–5.0)
ANION GAP: 7 (ref 5–15)
Albumin: 2.8 g/dL — ABNORMAL LOW (ref 3.5–5.0)
Anion gap: 8 (ref 5–15)
BUN: 26 mg/dL — ABNORMAL HIGH (ref 6–20)
BUN: 30 mg/dL — AB (ref 6–20)
CALCIUM: 8.5 mg/dL — AB (ref 8.9–10.3)
CHLORIDE: 104 mmol/L (ref 101–111)
CO2: 27 mmol/L (ref 22–32)
CO2: 26 mmol/L (ref 22–32)
CREATININE: 1.53 mg/dL — AB (ref 0.44–1.00)
Calcium: 8.5 mg/dL — ABNORMAL LOW (ref 8.9–10.3)
Chloride: 105 mmol/L (ref 101–111)
Creatinine, Ser: 1.68 mg/dL — ABNORMAL HIGH (ref 0.44–1.00)
GFR calc non Af Amer: 28 mL/min — ABNORMAL LOW (ref >60)
GFR, EST AFRICAN AMERICAN: 33 mL/min — AB (ref >60)
GFR, EST AFRICAN AMERICAN: 37 mL/min — AB (ref >60)
GFR, EST NON AFRICAN AMERICAN: 32 mL/min — AB (ref >60)
GLUCOSE: 127 mg/dL — AB (ref 65–99)
Glucose, Bld: 130 mg/dL — ABNORMAL HIGH (ref 65–99)
PHOSPHORUS: 2.1 mg/dL — AB (ref 2.5–4.6)
POTASSIUM: 4.2 mmol/L (ref 3.5–5.1)
Phosphorus: 2.7 mg/dL (ref 2.5–4.6)
Potassium: 4.3 mmol/L (ref 3.5–5.1)
Sodium: 139 mmol/L (ref 135–145)
Sodium: 138 mmol/L (ref 135–145)

## 2015-10-01 LAB — POCT I-STAT 3, ART BLOOD GAS (G3+)
Acid-Base Excess: 1 mmol/L (ref 0.0–2.0)
Bicarbonate: 26 mmol/L (ref 20.0–28.0)
O2 SAT: 99 %
PCO2 ART: 39.1 mmHg (ref 32.0–48.0)
PH ART: 7.427 (ref 7.350–7.450)
TCO2: 27 mmol/L (ref 0–100)
pO2, Arterial: 150 mmHg — ABNORMAL HIGH (ref 83.0–108.0)

## 2015-10-01 LAB — PROTIME-INR
INR: 2.28
Prothrombin Time: 25.5 seconds — ABNORMAL HIGH (ref 11.4–15.2)

## 2015-10-01 LAB — MAGNESIUM: Magnesium: 2.3 mg/dL (ref 1.7–2.4)

## 2015-10-01 LAB — CBC
HCT: 27.1 % — ABNORMAL LOW (ref 36.0–46.0)
HEMOGLOBIN: 8.4 g/dL — AB (ref 12.0–15.0)
MCH: 27.4 pg (ref 26.0–34.0)
MCHC: 31 g/dL (ref 30.0–36.0)
MCV: 88.3 fL (ref 78.0–100.0)
PLATELETS: 235 10*3/uL (ref 150–400)
RBC: 3.07 MIL/uL — AB (ref 3.87–5.11)
RDW: 15.4 % (ref 11.5–15.5)
WBC: 12.8 10*3/uL — AB (ref 4.0–10.5)

## 2015-10-01 LAB — IRON AND TIBC
IRON: 49 ug/dL (ref 28–170)
Saturation Ratios: 18 % (ref 10.4–31.8)
TIBC: 280 ug/dL (ref 250–450)
UIBC: 231 ug/dL

## 2015-10-01 LAB — HEPATIC FUNCTION PANEL
ALBUMIN: 2.8 g/dL — AB (ref 3.5–5.0)
ALT: 1313 U/L — AB (ref 14–54)
AST: 376 U/L — AB (ref 15–41)
Alkaline Phosphatase: 181 U/L — ABNORMAL HIGH (ref 38–126)
Bilirubin, Direct: 0.6 mg/dL — ABNORMAL HIGH (ref 0.1–0.5)
Indirect Bilirubin: 1.3 mg/dL — ABNORMAL HIGH (ref 0.3–0.9)
TOTAL PROTEIN: 5.6 g/dL — AB (ref 6.5–8.1)
Total Bilirubin: 1.9 mg/dL — ABNORMAL HIGH (ref 0.3–1.2)

## 2015-10-01 LAB — BASIC METABOLIC PANEL
ANION GAP: 7 (ref 5–15)
BUN: 26 mg/dL — ABNORMAL HIGH (ref 6–20)
CHLORIDE: 104 mmol/L (ref 101–111)
CO2: 27 mmol/L (ref 22–32)
Calcium: 8.4 mg/dL — ABNORMAL LOW (ref 8.9–10.3)
Creatinine, Ser: 1.65 mg/dL — ABNORMAL HIGH (ref 0.44–1.00)
GFR, EST AFRICAN AMERICAN: 34 mL/min — AB (ref >60)
GFR, EST NON AFRICAN AMERICAN: 29 mL/min — AB (ref >60)
Glucose, Bld: 126 mg/dL — ABNORMAL HIGH (ref 65–99)
POTASSIUM: 4.2 mmol/L (ref 3.5–5.1)
SODIUM: 138 mmol/L (ref 135–145)

## 2015-10-01 LAB — APTT
APTT: 66 s — AB (ref 24–36)
aPTT: 44 seconds — ABNORMAL HIGH (ref 24–36)

## 2015-10-01 LAB — HEPARIN LEVEL (UNFRACTIONATED)

## 2015-10-01 LAB — PHOSPHORUS: PHOSPHORUS: 2.2 mg/dL — AB (ref 2.5–4.6)

## 2015-10-01 LAB — VANCOMYCIN, RANDOM: VANCOMYCIN RM: 41

## 2015-10-01 LAB — FERRITIN: Ferritin: 1292 ng/mL — ABNORMAL HIGH (ref 11–307)

## 2015-10-01 MED ORDER — HYDROCORTISONE NA SUCCINATE PF 100 MG IJ SOLR
50.0000 mg | Freq: Two times a day (BID) | INTRAMUSCULAR | Status: DC
  Administered 2015-10-01 – 2015-10-02 (×2): 50 mg via INTRAVENOUS
  Filled 2015-10-01 (×2): qty 2

## 2015-10-01 MED ORDER — SODIUM PHOSPHATES 45 MMOLE/15ML IV SOLN
20.0000 mmol | Freq: Once | INTRAVENOUS | Status: AC
  Administered 2015-10-01: 20 mmol via INTRAVENOUS
  Filled 2015-10-01: qty 6.67

## 2015-10-01 MED FILL — Medication: Qty: 1 | Status: AC

## 2015-10-01 NOTE — Progress Notes (Signed)
Pharmacy Antibiotic Note  Cristina [name] is a 75 y.o. female admitted on 09/24/2015 with pneumonia.  Pharmacy has been consulted for Vancomycin/Levofloxacin dosing. Pt is currently on CRRT. Vancomycin dose held yesterday but pt received a dose at midnight. Vancomycin random this morning was 41. Will hold vancomycin for now and obtain random level 9/15 am.   Plan: -Hold vancomycin for now -Obtain random level 9/15 am -Continue levofloxacin 250mg  q24h -Follow-up length of therapy, culture data, renal function   Height: 5\' 4"  (162.6 cm) Weight: 201 lb 8 oz (91.4 kg) IBW/kg (Calculated) : 54.7  Temp (24hrs), Avg:97.3 F (36.3 C), Min:97.1 F (36.2 C), Max:97.5 F (36.4 C)   Recent Labs Lab 09/27/15 1756 09/27/15 1830  09/28/15 0225 09/29/15 0500 09/30/15 0400 09/30/15 0900 09/30/15 1600 10/01/15 0500  WBC  --  29.0*  --  23.2* 12.1* 19.2*  --   --  12.8*  CREATININE  --   --   < > 1.90* 3.07* 2.78*  2.80*  --  2.18* 1.65*  1.68*  LATICACIDVEN 2.8*  --   --   --   --   --   --   --   --   VANCORANDOM  --   --   --   --   --   --  25  --  41  < > = values in this interval not displayed.  Estimated Creatinine Clearance: 31.2 mL/min (by C-G formula based on SCr of 1.68 mg/dL (H)).    Allergies  Allergen Reactions  . Augmentin [Amoxicillin-Pot Clavulanate] Anaphylaxis and Other (See Comments)    Has patient had a PCN reaction causing immediate rash, facial/tongue/throat swelling, SOB or lightheadedness with hypotension: Yes Has patient had a PCN reaction causing severe rash involving mucus membranes or skin necrosis: No Has patient had a PCN reaction that required hospitalization No Has patient had a PCN reaction occurring within the last 10 years: Yes If all of the above answers are "NO", then may proceed with Cephalosporin use.  [name] Kitchen Lisinopril Anaphylaxis and Cough    Antimicrobials this admission: 9/10 Vancomycin >>  9/10 Levofloxacin >>   Dose adjustments this  admission: 9/11 Levaquin 750mg  daily to 750mg  every 48 hours 9/11 Vancomycin 750mg  every 12 hours to 1000mg  every 24 hours 9/12 Hold vancomycin and obtain random level 9/12 Levaquin 750 mg q48 to 500mg  q48 9/13 Vancomycin 1000mg  q24h and levofloxacin 250mg  q24h for CRRT 9/14 Hold vancomycin for now  Microbiology results: 9/10 BCx: NGTD  9/11 Sputum: normal flora  9/12 MRSA PCR: negative  Thank you for allowing pharmacy to be a part of this patient's care.   Arrie Senate, PharmD PGY-1 Pharmacy Resident Pager: [phone] 10/01/2015

## 2015-10-01 NOTE — Progress Notes (Signed)
[facility] Progress Note   Assessment/ Plan:   1. Acute kidney injury: Etiology likely ischemic ATN from profound/sustained hypotension. Continue CRRT at this time for volume unloading-no evidence of renal recovery, she remains anuric.  2. Hypoxic respiratory failure/sepsis/pneumonia: Ventilator management per CCM, on broad-spectrum antibiotic coverage including levofloxacin/vanco. 3. Status post fracture of the humerus and open reduction and internal fixation. Appears to be doing well from a surgical standpoint 4. Elevated LFTs/coagulopathy: Secondary to shock liver, LFTs trending down  5. Anemia: Possibly anemia of blood loss following fracture/recent surgery as well as anemia of critical illness. Ordered iron studies.  Subjective:   No acute events overnight, continues to tolerate CRRT with net negative fluid balance.    Objective:   BP 111/76   Pulse 97   Temp 97.1 F (36.2 C) (Oral)   Resp 16   Ht 5\' 4"  (1.626 m)   Wt 91.4 kg (201 lb 8 oz)   SpO2 100%   BMI 34.59 kg/m   Intake/Output Summary (Last 24 hours) at 10/01/15 0818 Last data filed at 10/01/15 0800  Gross per 24 hour  Intake          1449.29 ml  Output             4826 ml  Net         -3376.71 ml   Weight change: -1.9 kg (-4 lb 3 oz)  Physical Exam: [id number], comfortable on vent CVS: Pulse regular rhythm, S1 and S2 normal Resp: Coarse breath sounds bilaterally, no rales Abd: Soft, obese, nontender Ext: 1+ upper and lower extremity edema---volume status appears to be improving  Imaging: Dg Chest Port 1 View  Result Date: 10/01/2015 CLINICAL DATA:  Endotracheal tube support.  Shortness of breath. EXAM: PORTABLE CHEST 1 VIEW COMPARISON:  Nine hundred thirteen FINDINGS: Endotracheal tube has its tip 5 cm above the carina. Nasogastric tube enters stomach. Left internal jugular central line has its tip in the SVC 4 cm above the right atrium. Right internal jugular central line has its tip in  the SVC at the azygos level. Cardiomegaly persists. There is volume loss in an both lower lobes as seen previously. Small amount of pleural fluid. No significant change. IMPRESSION: No significant change since the study of yesterday. Volume loss/ infiltrate in the lower lobes with small effusions. Cardiomegaly. Lines and tubes satisfactory. Electronically Signed   By: Nelson Chimes M.D.   On: 10/01/2015 07:23   Dg Chest Port 1 View  Result Date: 09/30/2015 CLINICAL DATA:  Endotracheal tube evaluation.  Respiratory failure. EXAM: PORTABLE CHEST 1 VIEW COMPARISON:  Chest radiograph 09/29/2015 FINDINGS: Endotracheal tube tip is in unchanged position at the level of the clavicular heads. Right IJ central venous catheter tip overlies the lower SVC. Left IJ approach central venous catheter tip also overlies the lower SVC. Enteric tube courses beyond the field of view. There is persistent shallow lung inflation with bilateral pleural effusions, left-greater-than-right. No pneumothorax. Left retrocardiac consolidation and streaky right basilar opacities. IMPRESSION: 1. Unchanged support apparatus. 2. Persistent bilateral small pleural effusions and associated atelectasis. 3. Unchanged left retrocardiac consolidation. Electronically Signed   By: Ulyses Jarred M.D.   On: 09/30/2015 05:53   Dg Chest Port 1 View  Result Date: 09/29/2015 CLINICAL DATA:  Central line placement EXAM: PORTABLE CHEST 1 VIEW COMPARISON:  09/29/2015 FINDINGS: Right jugular central venous catheter with the tip projecting over the SVC. Left jugular central venous catheter projecting over the SVC. Nasogastric tube coursing below  the diaphragm. Endotracheal tube with the tip 2.4 cm above the carina. Small left pleural effusion. Bilateral mild interstitial thickening. No pneumothorax. Stable cardiomegaly. No acute osseous abnormality. Severe osteoarthritis of the left glenohumeral joint. IMPRESSION: 1. Endotracheal tube with the tip 2.4 cm above the  carina. 2. Right jugular central venous catheter with the tip projecting over the SVC. Electronically Signed   By: Kathreen Devoid   On: 09/29/2015 18:40    Labs: BMET  Recent Labs Lab 09/25/15 0517 09/27/15 1855 09/28/15 0225 09/29/15 0500 09/30/15 0400 09/30/15 1600 10/01/15 0500  NA 141 134* 140 140 139  140 138 138  139  K 3.3* 5.2* 4.6 4.4 4.7  4.8 4.4 4.2  4.2  CL 105 107 112* 108 105  106 105 104  105  CO2 30 17* 20* 21* 25  24 23 27  27   GLUCOSE 99 173* 139* 118* 100*  98 96 126*  127*  BUN 12 23* 25* 38* 33*  33* 27* 26*  26*  CREATININE 0.79 1.78* 1.90* 3.07* 2.78*  2.80* 2.18* 1.65*  1.68*  CALCIUM 8.3* 7.6* 7.6* 8.1* 8.5*  8.6* 8.6* 8.4*  8.5*  PHOS  --  6.7* 5.5* 5.7* 5.4*  5.3* 2.7 2.2*  2.1*   CBC  Recent Labs Lab 09/27/15 1830 09/28/15 0225 09/29/15 0500 09/30/15 0400 10/01/15 0500  WBC 29.0* 23.2* 12.1* 19.2* 12.8*  NEUTROABS 26.9*  --   --   --   --   HGB 10.9* 10.3* 8.0* 8.6* 8.4*  HCT 36.1 34.3* 25.0* 28.8* 27.1*  MCV 92.1 91.5 87.7 90.9 88.3  PLT PLATELET CLUMPS NOTED ON SMEAR, COUNT APPEARS ADEQUATE 444* 210 287 235    Medications:    . sodium chloride   Intravenous Once  . aspirin  81 mg Oral Daily  . budesonide (PULMICORT) nebulizer solution  0.5 mg Nebulization BID  . chlorhexidine  15 mL Mouth Rinse BID  . citalopram  20 mg Oral QHS  . feeding supplement (PRO-STAT SUGAR FREE 64)  60 mL Per Tube BID  . hydrocortisone sodium succinate  50 mg Intravenous Q6H  . insulin aspart  0-20 Units Subcutaneous Q4H  . ipratropium-albuterol  3 mL Nebulization QID  . levofloxacin (LEVAQUIN) IV  250 mg Intravenous Q24H  . mouth rinse  15 mL Mouth Rinse QID  . pantoprazole sodium  40 mg Per Tube Daily  . pramipexole  2 mg Oral QHS  . sodium chloride  250 mL Intravenous Once  . sodium chloride flush  10-40 mL Intracatheter Q12H  . Vitamin D (Ergocalciferol)  50,000 Units Oral Q7 days   Elmarie Shiley, MD 10/01/2015, 8:18 AM

## 2015-10-01 NOTE — Progress Notes (Addendum)
ANTICOAGULATION CONSULT NOTE - Follow Up Consult  Pharmacy Consult for Heparin Indication: atrial fibrillation  Allergies  Allergen Reactions  . Augmentin [Amoxicillin-Pot Clavulanate] Anaphylaxis and Other (See Comments)    Has patient had a PCN reaction causing immediate rash, facial/tongue/throat swelling, SOB or lightheadedness with hypotension: Yes Has patient had a PCN reaction causing severe rash involving mucus membranes or skin necrosis: No Has patient had a PCN reaction that required hospitalization No Has patient had a PCN reaction occurring within the last 10 years: Yes If all of the above answers are "NO", then may proceed with Cephalosporin use.  [name] Kitchen Lisinopril Anaphylaxis and Cough    Patient Measurements: Height: 5\' 4"  (162.6 cm) Weight: 201 lb 8 oz (91.4 kg) IBW/kg (Calculated) : 54.7 Heparin Dosing Weight: 75 kg  Vital Signs: Temp: 97.3 F (36.3 C) (09/14 1200) Temp Source: Oral (09/14 1200) BP: 107/65 (09/14 1400) Pulse Rate: 94 (09/14 1400)  Labs:  Recent Labs  09/29/15 0500 09/30/15 0400  09/30/15 1600 09/30/15 1900 10/01/15 0500 10/01/15 1400  HGB 8.0* 8.6*  --   --   --  8.4*  --   HCT 25.0* 28.8*  --   --   --  27.1*  --   PLT 210 287  --   --   --  235  --   APTT  --   --   < >  --  36 44* 66*  LABPROT 43.0* 27.9*  --   --   --  25.5*  --   INR 4.52* 2.55  --   --   --  2.28  --   HEPARINUNFRC  --   --   --   --   --  >2.20*  --   CREATININE 3.07* 2.78*  2.80*  --  2.18*  --  1.65*  1.68*  --   < > = values in this interval not displayed.  Estimated Creatinine Clearance: 31.2 mL/min (by C-G formula based on SCr of 1.68 mg/dL (H)).   Assessment: 80 yoF on Xarelto PTA for atrial fibrillation now changed to IV heparin in setting of acute shock liver and CRRT. Patient noted to be high bleed risk and dosing cautiously, will target lower range of aPTT goal. aPTT currently therapeutic at 66 after dose adjustments, heparin level remains spuriously  elevated given use of Xarelto. Will defer confirmatory level to morning labs. Hgb down to 8.4, plt normal, CRRT running without issues at this time, and no S/Sx bleeding noted.  Goal of Therapy:  APTT 66-90 Monitor platelets by anticoagulation protocol: Yes   Plan:  -Continue heparin 1250 units/hr. -Daily aPTT, heparin level, CBC, and heparin level. -Switch to monitoring heparin levels once they correlate with aPTT. -Monitor signs and symptoms of bleeding.   Arrie Senate, PharmD PGY-1 Pharmacy Resident Pager: [phone] 10/01/2015

## 2015-10-01 NOTE — Progress Notes (Addendum)
ANTICOAGULATION CONSULT NOTE - Follow Up Consult  Pharmacy Consult for heparin Indication: atrial fibrillation  Labs:  Recent Labs  09/28/15 0730  09/29/15 0500 09/30/15 0400 09/30/15 1030 09/30/15 1600 09/30/15 1900 10/01/15 0500  HGB  --   < > 8.0* 8.6*  --   --   --  8.4*  HCT  --   --  25.0* 28.8*  --   --   --  27.1*  PLT  --   --  210 287  --   --   --  235  APTT  --   --   --   --  29  --  36 44*  LABPROT  --   < > 43.0* 27.9*  --   --   --  25.5*  INR  --   < > 4.52* 2.55  --   --   --  2.28  HEPARINUNFRC  --   --   --   --   --   --   --  >2.20*  CREATININE  --   < > 3.07* 2.78*  2.80*  --  2.18*  --  1.68*  TROPONINI 0.32*  --   --   --   --   --   --   --   < > = values in this interval not displayed.   Assessment: 75yo female remains subtherapeutic on heparin after rate increase (conservative dosing 2/2 bleeding risk).  Goal of Therapy:  aPTT 66-102 seconds   Plan:  Will increase heparin gtt by 2-3 units/kg/hr to 1250 units/hr and check PTT in 8hr.  Wynona Neat, PharmD, BCPS  10/01/2015,5:39 AM

## 2015-10-01 NOTE — Progress Notes (Signed)
PULMONARY / CRITICAL CARE MEDICINE   Name: Cristina [name] MRN: [id number] DOB: 1940-09-29    ADMISSION DATE:  09/24/2015 CONSULTATION DATE:  09/27/15  REFERRING MD: Dr. Erlinda Hong  CHIEF COMPLAINT:  Pt admitted after a fall. PCCM consulted for hypotension and bradycardia.   HISTORY OF PRESENT ILLNESS:   Cristina [name] is a 75 y.o. female with hx AFib on xarelto, asthma, HTN,who was admitted after a fall on 9/7. Orthopedic did ORIF of Right distal humerus fracture on 9/7. She was doing well post op until until 9/10 when she developed hypotension.  Rapid response was called. Blood pressure was 60 systolic. Heart rate was in the 50s. Atropine 1 mg IV given. PCCM consulted and pt intubated.  EKG done showed junctional rhythm, Q waves in 1 and aVL with t wave inversion as well, new from baseline.  Transferred to [facility], cardiology consulted.   SUBJECTIVE:  Follows commands.  On CRRT pulling 150/hr. Minimal uop.   VITAL SIGNS: BP 123/87   Pulse (!) 105   Temp 98.1 F (36.7 C) (Oral)   Resp 19   Ht 5\' 4"  (1.626 m)   Wt 201 lb 8 oz (91.4 kg)   SpO2 100%   BMI 34.59 kg/m   HEMODYNAMICS: CVP:  [11 mmHg-16 mmHg] 12 mmHg  VENTILATOR SETTINGS: Vent Mode: PRVC FiO2 (%):  [30 %-40 %] 30 % Set Rate:  [24 bmp] 24 bmp Vt Set:  [500 mL] 500 mL PEEP:  [5 cmH20] 5 cmH20 Pressure Support:  [10 cmH20] 10 cmH20 Plateau Pressure:  [14 cmH20-18 cmH20] 14 cmH20  INTAKE / OUTPUT: I/O last 3 completed shifts: In: 2123.7 [I.V.:857.1; NG/GT:1016.7; IV Piggyback:250] Out: [id number] [Urine:37; Emesis/NG output:400; Other:5845]  PHYSICAL EXAMINATION: General: Chronically ill appearing elderly female, NAD on vent. Neuro : Awake and following commands. HEENT:  PERLA, (-) NVD. Cardiovascular:  IRIR, Nl s1/s2. -M/R/G. Lungs:  Dec BS BLF. Some crackles at bases.  Abdomen:  (+) BS, soft, NT, obese Musculoskeletal:  Gr 1 edema Skin:  Warm extremeities.   LABS:  BMET  Recent Labs Lab 09/30/15 0400  09/30/15 1600 10/01/15 0500  NA 139  140 138 138  139  K 4.7  4.8 4.4 4.2  4.2  CL 105  106 105 104  105  CO2 25  24 23 27  27   BUN 33*  33* 27* 26*  26*  CREATININE 2.78*  2.80* 2.18* 1.65*  1.68*  GLUCOSE 100*  98 96 126*  127*   Electrolytes  Recent Labs Lab 09/29/15 0500 09/30/15 0400 09/30/15 1600 10/01/15 0500  CALCIUM 8.1* 8.5*  8.6* 8.6* 8.4*  8.5*  MG 2.1 2.5*  --  2.3  PHOS 5.7* 5.4*  5.3* 2.7 2.2*  2.1*   CBC  Recent Labs Lab 09/29/15 0500 09/30/15 0400 10/01/15 0500  WBC 12.1* 19.2* 12.8*  HGB 8.0* 8.6* 8.4*  HCT 25.0* 28.8* 27.1*  PLT 210 287 235   Coag's  Recent Labs Lab 09/29/15 0500 09/30/15 0400 09/30/15 1030 09/30/15 1900 10/01/15 0500  APTT  --   --  29 36 44*  INR 4.52* 2.55  --   --  2.28    Sepsis Markers  Recent Labs Lab 09/27/15 1756  09/28/15 0225 09/29/15 0415 09/29/15 0500  LATICACIDVEN 2.8*  --   --   --   --   PROCALCITON  --   < > 7.26 5.96 7.43  < > = values in this interval not displayed.  ABG  Recent  Labs Lab 09/30/15 0431 09/30/15 0851 10/01/15 0337  PHART 7.216* 7.154* 7.427  PCO2ART 56.2* 70.5* 39.1  PO2ART 124* 132.0* 150.0*   Liver Enzymes  Recent Labs Lab 09/29/15 0500 09/30/15 0400 09/30/15 1600 10/01/15 0500  AST 2,724* 1,226*  --  376*  ALT 2,394* 2,091*  --  1,313*  ALKPHOS 144* 182*  --  181*  BILITOT 1.7* 1.7*  --  1.9*  ALBUMIN 2.3* 3.2*  3.2* 2.9* 2.8*  2.8*   Cardiac Enzymes  Recent Labs Lab 09/27/15 1855 09/28/15 0225 09/28/15 0730  TROPONINI 0.08* 0.33* 0.32*   Glucose  Recent Labs Lab 09/30/15 1216 09/30/15 1600 09/30/15 2031 09/30/15 2344 10/01/15 0343 10/01/15 0812  GLUCAP 76 98 106* 91 106* 110*   Imaging Dg Chest Port 1 View  Result Date: 10/01/2015 CLINICAL DATA:  Endotracheal tube support.  Shortness of breath. EXAM: PORTABLE CHEST 1 VIEW COMPARISON:  Nine hundred thirteen FINDINGS: Endotracheal tube has its tip 5 cm above the  carina. Nasogastric tube enters stomach. Left internal jugular central line has its tip in the SVC 4 cm above the right atrium. Right internal jugular central line has its tip in the SVC at the azygos level. Cardiomegaly persists. There is volume loss in an both lower lobes as seen previously. Small amount of pleural fluid. No significant change. IMPRESSION: No significant change since the study of yesterday. Volume loss/ infiltrate in the lower lobes with small effusions. Cardiomegaly. Lines and tubes satisfactory. Electronically Signed   By: Nelson Chimes M.D.   On: 10/01/2015 07:23   STUDIES:  2D echo 9/10>>>EF [id number], mod MR, systolic function moderately reduced, PA peak pressure 57mm  CULTURES: Blood 9/10 > Sputum 9/10 >normal flora  MRSA 9/10 >  ANTIBIOTICS: Levaquin 9/10 > Vanc 9/10 >   SIGNIFICANT EVENTS: 9/7 admit after fall. Had repair of right shoulder surgery. 9/10 PCCM consulted for hypotension, bradycardia. Intubated for airway.  LINES/TUBES: L IJ CVL 9/10>>> ETT 9/11>>> R IJ HD catheter 9/12>>>  DISCUSSION: 71 female, admitted after a fall, had repair of her right shoulder on 9/7, chronic atrial fibrillation on xarelto.  Tx to ICU 9/10 with shock, bradycardia, respiratory failure.    ASSESSMENT / PLAN:  PULMONARY A: Acute hypoxemic respiratory failure 2/2 unable to protect her airway, concern for sepsis, concern for NSTEMI/demand ischemia P:   Cont daily SBT, has not tolerated last few days  Volume off as able with CRRT as below  Abx for RLL infiltrate. F/u CXR    CARDIOVASCULAR A:  Shock - resolved. Unclear etiology.  Septic shock r/t PNA (although CXR not overly impressive, lactic acid only 2.8) v cardiogenic with significant bradycardia in setting B blocker and Ca channel blocker.   Concern for demand ischemia given EKG changes.  Concern for sepsis/PNA but CXR is not too overwhelming.  Chronic Atrial fibrillation. P:  Cards following  Volume removal with  CVVHD  heparin drip per pharmacy.  Hold NOAC Wean stress steroids   RENAL A:   Acute renal failure Hyperkalemia Oliguria hypophos  P:   F/u chem  KVO IVF. CVP elevated, continue to follow. CRRT with -150 ml/hr. Renal following  Na phos   GASTROINTESTINAL A:   Shocked liver - improving slowly.  P:   TF per nutrition. LFTs in AM. Maintain hemodynamics. PPI. Viral hepatitis panel negative  HEMATOLOGIC A:   Coagulopathy - r/t shock liver. Improved.  P:  F/U CBC. INR in AM. Heparin drip per pharmacy See GI section above.  INFECTIOUS A:   LLL PNA. Possible sepsis vs drug induced hypotension vs cardiac/NSTEMI P:   Follow cultures  Levaquin  D/c vanc    ENDOCRINE A:   No issues P:   TF per nutrition SSI   NEUROLOGIC A:   Anxiety/pain P:   RASS goal: 0- -1 Fentanyl drip and versed prn  FAMILY  - Updates: Daughter updated at bedside 9/14.  - Inter-disciplinary family meet or Palliative Care meeting due by:  10/04/15   Nickolas Madrid, NP 10/01/2015  11:02 AM Pager: [phone] or [id number]  Attending Note:  75 year old female with PMH of renal disease who fell and injured her shoulder followed by likely aspiration and near cardiac arrest.  Developed renal failure and now on CRRT and shocked liver that is now improving.  CRRT running at -150 ml/hr and respiratory status is improving.  Will continue volume negative for now.  Anticipate will be able to extubate by AM if all continues according to plan.  Lungs remain with crackles on exam.  And CXR that I reviewed myself with ETT ok but pulmonary edema present.  The patient is critically ill with multiple organ systems failure and requires high complexity decision making for assessment and support, frequent evaluation and titration of therapies, application of advanced monitoring technologies and extensive interpretation of multiple databases.   Critical Care Time devoted to patient care services  described in this note is  35  Minutes. This time reflects time of care of this signee Dr Jennet Maduro. This critical care time does not reflect procedure time, or teaching time or supervisory time of PA/NP/Med student/Med Resident etc but could involve care discussion time.  Rush Farmer, M.D. [facility] Pulmonary/Critical Care Medicine. Pager: [phone]. After hours pager: [phone].

## 2015-10-02 ENCOUNTER — Inpatient Hospital Stay (HOSPITAL_COMMUNITY): Payer: Medicare Other

## 2015-10-02 DIAGNOSIS — I481 Persistent atrial fibrillation: Secondary | ICD-10-CM

## 2015-10-02 DIAGNOSIS — I4819 Other persistent atrial fibrillation: Secondary | ICD-10-CM

## 2015-10-02 LAB — RENAL FUNCTION PANEL
ALBUMIN: 3.4 g/dL — AB (ref 3.5–5.0)
Albumin: 3 g/dL — ABNORMAL LOW (ref 3.5–5.0)
Anion gap: 8 (ref 5–15)
Anion gap: 5 (ref 5–15)
BUN: 29 mg/dL — AB (ref 6–20)
BUN: 30 mg/dL — AB (ref 6–20)
CALCIUM: 8.9 mg/dL (ref 8.9–10.3)
CHLORIDE: 102 mmol/L (ref 101–111)
CHLORIDE: 101 mmol/L (ref 101–111)
CO2: 27 mmol/L (ref 22–32)
CO2: 29 mmol/L (ref 22–32)
CREATININE: 1.3 mg/dL — AB (ref 0.44–1.00)
CREATININE: 1.21 mg/dL — AB (ref 0.44–1.00)
Calcium: 8.8 mg/dL — ABNORMAL LOW (ref 8.9–10.3)
GFR calc Af Amer: 45 mL/min — ABNORMAL LOW (ref >60)
GFR, EST AFRICAN AMERICAN: 49 mL/min — AB (ref >60)
GFR, EST NON AFRICAN AMERICAN: 39 mL/min — AB (ref >60)
GFR, EST NON AFRICAN AMERICAN: 42 mL/min — AB (ref >60)
GLUCOSE: 108 mg/dL — AB (ref 65–99)
Glucose, Bld: 119 mg/dL — ABNORMAL HIGH (ref 65–99)
PHOSPHORUS: 3.1 mg/dL (ref 2.5–4.6)
Phosphorus: 3 mg/dL (ref 2.5–4.6)
Potassium: 4.4 mmol/L (ref 3.5–5.1)
Potassium: 5.3 mmol/L — ABNORMAL HIGH (ref 3.5–5.1)
SODIUM: 135 mmol/L (ref 135–145)
Sodium: 137 mmol/L (ref 135–145)

## 2015-10-02 LAB — CBC
HCT: 30.5 % — ABNORMAL LOW (ref 36.0–46.0)
Hemoglobin: 9.3 g/dL — ABNORMAL LOW (ref 12.0–15.0)
MCH: 26.9 pg (ref 26.0–34.0)
MCHC: 30.5 g/dL (ref 30.0–36.0)
MCV: 88.2 fL (ref 78.0–100.0)
PLATELETS: 318 10*3/uL (ref 150–400)
RBC: 3.46 MIL/uL — AB (ref 3.87–5.11)
RDW: 15.7 % — AB (ref 11.5–15.5)
WBC: 18.1 10*3/uL — ABNORMAL HIGH (ref 4.0–10.5)

## 2015-10-02 LAB — PROTIME-INR
INR: 1.82
PROTHROMBIN TIME: 21.3 s — AB (ref 11.4–15.2)

## 2015-10-02 LAB — GLUCOSE, CAPILLARY
GLUCOSE-CAPILLARY: 109 mg/dL — AB (ref 65–99)
GLUCOSE-CAPILLARY: 86 mg/dL (ref 65–99)
GLUCOSE-CAPILLARY: 93 mg/dL (ref 65–99)
Glucose-Capillary: 69 mg/dL (ref 65–99)
Glucose-Capillary: 96 mg/dL (ref 65–99)
Glucose-Capillary: 109 mg/dL — ABNORMAL HIGH (ref 65–99)

## 2015-10-02 LAB — CULTURE, BLOOD (ROUTINE X 2)
CULTURE: NO GROWTH
Culture: NO GROWTH

## 2015-10-02 LAB — MAGNESIUM: MAGNESIUM: 2.4 mg/dL (ref 1.7–2.4)

## 2015-10-02 LAB — HEPARIN LEVEL (UNFRACTIONATED): Heparin Unfractionated: >2.2 IU/mL — ABNORMAL HIGH (ref 0.30–0.70)

## 2015-10-02 LAB — APTT: aPTT: 77 seconds — ABNORMAL HIGH (ref 24–36)

## 2015-10-02 MED ORDER — DEXTROSE 50 % IV SOLN
INTRAVENOUS | Status: AC
  Administered 2015-10-02: 50 mL via INTRAVENOUS
  Filled 2015-10-02: qty 50

## 2015-10-02 MED ORDER — IPRATROPIUM-ALBUTEROL 0.5-2.5 (3) MG/3ML IN SOLN
3.0000 mL | Freq: Four times a day (QID) | RESPIRATORY_TRACT | Status: DC
Start: 2015-10-02 — End: 2015-10-06
  Administered 2015-10-02 – 2015-10-06 (×16): 3 mL via RESPIRATORY_TRACT
  Filled 2015-10-02 (×16): qty 3

## 2015-10-02 MED ORDER — DEXTROSE 50 % IV SOLN
1.0000 | Freq: Once | INTRAVENOUS | Status: AC
  Administered 2015-10-02: 50 mL via INTRAVENOUS

## 2015-10-02 MED ORDER — NOREPINEPHRINE BITARTRATE 1 MG/ML IV SOLN
0.0000 ug/min | INTRAVENOUS | Status: DC
  Administered 2015-10-02: 2 ug/min via INTRAVENOUS
  Administered 2015-10-03: 6 ug/min via INTRAVENOUS
  Administered 2015-10-04: 2 ug/min via INTRAVENOUS
  Filled 2015-10-02 (×4): qty 4

## 2015-10-02 MED ORDER — ACETAMINOPHEN 160 MG/5ML PO SOLN
650.0000 mg | Freq: Four times a day (QID) | ORAL | Status: DC | PRN
  Administered 2015-10-08 – 2015-10-14 (×6): 650 mg via ORAL
  Filled 2015-10-02 (×6): qty 20.3

## 2015-10-02 MED ORDER — METOPROLOL TARTRATE 5 MG/5ML IV SOLN
2.5000 mg | INTRAVENOUS | Status: DC
  Administered 2015-10-02 (×2): 2.5 mg via INTRAVENOUS
  Filled 2015-10-02: qty 5

## 2015-10-02 MED ORDER — METOPROLOL TARTRATE 5 MG/5ML IV SOLN
INTRAVENOUS | Status: AC
  Filled 2015-10-02: qty 5

## 2015-10-02 NOTE — Progress Notes (Signed)
ANTICOAGULATION CONSULT NOTE - Follow Up Consult  Pharmacy Consult for Heparin Indication: atrial fibrillation  Allergies  Allergen Reactions  . Augmentin [Amoxicillin-Pot Clavulanate] Anaphylaxis and Other (See Comments)    Has patient had a PCN reaction causing immediate rash, facial/tongue/throat swelling, SOB or lightheadedness with hypotension: Yes Has patient had a PCN reaction causing severe rash involving mucus membranes or skin necrosis: No Has patient had a PCN reaction that required hospitalization No Has patient had a PCN reaction occurring within the last 10 years: Yes If all of the above answers are "NO", then may proceed with Cephalosporin use.  [name] Kitchen Lisinopril Anaphylaxis and Cough    Patient Measurements: Height: 5\' 4"  (162.6 cm) Weight: 191 lb 5.8 oz (86.8 kg) IBW/kg (Calculated) : 54.7 Heparin Dosing Weight: 75 kg  Vital Signs: Temp: 97.6 F (36.4 C) (09/15 0400) Temp Source: Oral (09/15 0400) BP: 103/75 (09/15 0600) Pulse Rate: 85 (09/15 0600)  Labs:  Recent Labs  09/30/15 0400  10/01/15 0500 10/01/15 1400 10/01/15 1607 10/02/15 0400  HGB 8.6*  --  8.4*  --   --  9.3*  HCT 28.8*  --  27.1*  --   --  30.5*  PLT 287  --  235  --   --  318  APTT  --   < > 44* 66*  --  77*  LABPROT 27.9*  --  25.5*  --   --  21.3*  INR 2.55  --  2.28  --   --  1.82  HEPARINUNFRC  --   --  >2.20*  --   --  >2.20*  CREATININE 2.78*  2.80*  < > 1.65*  1.68*  --  1.53* 1.30*  < > = values in this interval not displayed.  Estimated Creatinine Clearance: 39.2 mL/min (by C-G formula based on SCr of 1.3 mg/dL (H)).   Assessment: 3 yoF on Xarelto PTA for atrial fibrillation now changed to IV heparin in setting of acute shock liver and CRRT. Patient noted to be high bleed risk and dosing cautiously, will target lower range of aPTT goal. aPTT remains therapeutic at 77 after dose adjustments, heparin level remains spuriously elevated given prior use of Xarelto. Hgb up to 9.3,  plt normal, CRRT running without issues at this time, and no S/Sx bleeding noted.  Goal of Therapy:  APTT 66-90 Monitor platelets by anticoagulation protocol: Yes   Plan:  -Continue heparin 1250 units/hr. -Daily aPTT, heparin level, CBC, and heparin level. -Switch to monitoring heparin levels once they correlate with aPTT. -Monitor signs and symptoms of bleeding.   Arrie Senate, PharmD PGY-1 Pharmacy Resident Pager: [phone] 10/02/2015

## 2015-10-02 NOTE — Progress Notes (Signed)
100 ml of fentanyl wasted in sink due to expiration at 1500. Witnessed by Arletha Pili, RN.

## 2015-10-02 NOTE — Progress Notes (Signed)
SUBJECTIVE:  Intubated and sedated  OBJECTIVE:   Vitals:   Vitals:   10/02/15 0600 10/02/15 0734 10/02/15 0740 10/02/15 0800  BP: 103/75     Pulse: 85   (!) 104  Resp: (!) 23   (!) 24  Temp:    97.4 F (36.3 C)  TempSrc:    Oral  SpO2: 100% 100% 99% 100%  Weight:      Height:       I&O's:   Intake/Output Summary (Last 24 hours) at 10/02/15 0950 Last data filed at 10/02/15 0900  Gross per 24 hour  Intake             1842 ml  Output             5630 ml  Net            -3788 ml   TELEMETRY: Reviewed telemetry pt in atrial fibrillation:     PHYSICAL EXAM General: intubated and sedated Head: Eyes PERRLA, No xanthomas.   Normal cephalic and atramatic  Lungs:   Clear bilaterally to auscultation anteriorly Heart:   Irregularly irregular S1 S2 Pulses are 2+ & equal. Abdomen: Bowel sounds are positive, abdomen soft and non-tender without masses  Msk:  Back normal, normal gait. Normal strength and tone for age. Extremities:   No clubbing, cyanosis or edema.  DP +1 Neuro: sedated Psych:  Cannot assess sedated   LABS: Basic Metabolic Panel:  Recent Labs  10/01/15 0500 10/01/15 1607 10/02/15 0400  NA 138  139 138 137  K 4.2  4.2 4.3 4.4  CL 104  105 104 102  CO2 27  27 26 27   GLUCOSE 126*  127* 130* 108*  BUN 26*  26* 30* 29*  CREATININE 1.65*  1.68* 1.53* 1.30*  CALCIUM 8.4*  8.5* 8.5* 8.8*  MG 2.3  --  2.4  PHOS 2.2*  2.1* 2.7 3.0   Liver Function Tests:  Recent Labs  09/30/15 0400  10/01/15 0500 10/01/15 1607 10/02/15 0400  AST 1,226*  --  376*  --   --   ALT 2,091*  --  1,313*  --   --   ALKPHOS 182*  --  181*  --   --   BILITOT 1.7*  --  1.9*  --   --   PROT 6.5  --  5.6*  --   --   ALBUMIN 3.2*  3.2*  < > 2.8*  2.8* 2.8* 3.0*  < > = values in this interval not displayed. No results for input(s): LIPASE, AMYLASE in the last 72 hours. CBC:  Recent Labs  10/01/15 0500 10/02/15 0400  WBC 12.8* 18.1*  HGB 8.4* 9.3*  HCT 27.1* 30.5*   MCV 88.3 88.2  PLT 235 318   Cardiac Enzymes: No results for input(s): CKTOTAL, CKMB, CKMBINDEX, TROPONINI in the last 72 hours. BNP: Invalid input(s): POCBNP D-Dimer: No results for input(s): DDIMER in the last 72 hours. Hemoglobin A1C: No results for input(s): HGBA1C in the last 72 hours. Fasting Lipid Panel: No results for input(s): CHOL, HDL, LDLCALC, TRIG, CHOLHDL, LDLDIRECT in the last 72 hours. Thyroid Function Tests: No results for input(s): TSH, T4TOTAL, T3FREE, THYROIDAB in the last 72 hours.  Invalid input(s): FREET3 Anemia Panel:  Recent Labs  10/01/15 0830  FERRITIN 1,292*  TIBC 280  IRON 49   Coag Panel:   Lab Results  Component Value Date   INR 1.82 10/02/2015   INR 2.28 10/01/2015   INR 2.55  09/30/2015    RADIOLOGY: Dg Elbow 2 Views Right  Result Date: 09/24/2015 CLINICAL DATA:  ORIF. EXAM: RIGHT ELBOW - 2 VIEW; DG C-ARM GT 120 MIN COMPARISON:  CT 09/15/2015. FINDINGS: ORIF distal humeral fracture. Hardware intact. Anatomic alignment. ORIF proximal ulna . Hardware intact. Anatomic alignment. Two images obtained. 0 minutes 23 seconds fluoroscopy time. IMPRESSION: Postsurgical changes about the right elbow . Electronically Signed   By: Marcello Moores  Register   On: 09/24/2015 16:19   Dg Elbow 2 Views Right  Result Date: 09/15/2015 CLINICAL DATA:  Right elbow pain and bruising posteriorly after a fall today. EXAM: RIGHT ELBOW - 2 VIEW COMPARISON:  None. FINDINGS: Oblique comminuted fracture demonstrated along the distal humeral shaft and extending to the intercondylar region. Fracture lines extend to the medial and lateral condyles. There is impaction and displacement of the distal fracture fragments. No definite dislocation of the elbow joint although limited positioning is available. Moderate size right elbow effusion. IMPRESSION: Comminuted and displaced impacted fractures of the distal right humeral shaft extending into the intercondylar region, involving both  medial and lateral condyles. Associated effusion. Electronically Signed   By: Lucienne Capers M.D.   On: 09/15/2015 19:20   Dg Forearm Right  Result Date: 09/15/2015 CLINICAL DATA:  Fall.  Elbow pain. EXAM: RIGHT FOREARM - 2 VIEW COMPARISON:  None. FINDINGS: Acute oblique fracture of the distal humerus. 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 No forearm fracture is seen. IMPRESSION: 1. Distal humeral fracture. 2. Madelung deformity of the distal radius/ulna. Electronically Signed   By: Van Clines M.D.   On: 09/15/2015 21:38   Ct Elbow Right Wo Contrast  Result Date: 09/16/2015 CLINICAL DATA:  Patient fell wall today at home. Right elbow fractures. EXAM: CT OF THE RIGHT ELBOW WITHOUT CONTRAST; 3-DIMENSIONAL CT IMAGE RENDERING ON ACQUISITION WORKSTATION TECHNIQUE: Multidetector CT imaging was performed according to the standard protocol. Multiplanar CT image reconstructions were also generated. COMPARISON:  Right elbow radiographs 09/15/2015 FINDINGS: Comminuted fractures demonstrated of the distal right humerus. Oblique fracture line begins in the distal shaft and extends towards the intercondylar region. There is mild posterior displacement and overlap of the distal fracture fragments. Transverse fracture lines extend to the medial and lateral epicondyles. Condylar fragments are displaced and distracted. Impaction of the condylar fragments into the distal humerus. The ulnar and radial joints appear preserved without evidence of dislocation at the joints. Fracture lines do not appear to extend to the articular surfaces. Soft tissue swelling and effusion are present. Incidental note of a fracture of the lateral right ribs which could be acute. IMPRESSION: Comminuted fractures of the distal right humerus involving the distal shaft and intercondylar regions. Impaction and distraction of the condylar fragments. Posterior angulation and displacement of the distal shaft  fragment. Fracture lines do not appear to be intraarticular and there is no evidence of dislocation of the joint. Incidental note of a right lateral rib fracture which could be acute. Electronically Signed   By: Lucienne Capers M.D.   On: 09/16/2015 01:02   Ct 3d Recon At Scanner  Result Date: 09/16/2015 CLINICAL DATA:  Patient fell wall today at home. Right elbow fractures. EXAM: CT OF THE RIGHT ELBOW WITHOUT CONTRAST; 3-DIMENSIONAL CT IMAGE RENDERING ON ACQUISITION WORKSTATION TECHNIQUE: Multidetector CT imaging was performed according to the standard protocol. Multiplanar CT image reconstructions were also generated. COMPARISON:  Right elbow radiographs 09/15/2015 FINDINGS: Comminuted fractures demonstrated of the distal right humerus. Oblique fracture line begins in  the distal shaft and extends towards the intercondylar region. There is mild posterior displacement and overlap of the distal fracture fragments. Transverse fracture lines extend to the medial and lateral epicondyles. Condylar fragments are displaced and distracted. Impaction of the condylar fragments into the distal humerus. The ulnar and radial joints appear preserved without evidence of dislocation at the joints. Fracture lines do not appear to extend to the articular surfaces. Soft tissue swelling and effusion are present. Incidental note of a fracture of the lateral right ribs which could be acute. IMPRESSION: Comminuted fractures of the distal right humerus involving the distal shaft and intercondylar regions. Impaction and distraction of the condylar fragments. Posterior angulation and displacement of the distal shaft fragment. Fracture lines do not appear to be intraarticular and there is no evidence of dislocation of the joint. Incidental note of a right lateral rib fracture which could be acute. Electronically Signed   By: Lucienne Capers M.D.   On: 09/16/2015 01:02   Dg Chest Portable 1 View  Result Date: 10/02/2015 CLINICAL  DATA:  Respiratory failure, CHF, atrial fibrillation; status post ORIF of a humeral fracture on September 24, 2015 EXAM: PORTABLE CHEST 1 VIEW COMPARISON:  Portable chest x-ray dated October 01, 2015 FINDINGS: The lungs remain hypoinflated. Bibasilar densities persist. The left hemidiaphragm remains obscured. The heart is mildly enlarged. The pulmonary vascularity is slightly less engorged. There is calcification in the wall of the aortic arch. The endotracheal tube tip lies 4.1 cm above the carina. The right and left internal jugular venous catheters project over the proximal and mid SVC respectively. There are did chronic changes of the left humeral head. IMPRESSION: Mild CHF, improved. Persistent bibasilar atelectasis or pneumonia with small pleural effusions. The support tubes are in stable position. Aortic atherosclerosis. Electronically Signed   By: David  Martinique M.D.   On: 10/02/2015 07:49   Dg Chest Port 1 View  Result Date: 10/01/2015 CLINICAL DATA:  Endotracheal tube support.  Shortness of breath. EXAM: PORTABLE CHEST 1 VIEW COMPARISON:  Nine hundred thirteen FINDINGS: Endotracheal tube has its tip 5 cm above the carina. Nasogastric tube enters stomach. Left internal jugular central line has its tip in the SVC 4 cm above the right atrium. Right internal jugular central line has its tip in the SVC at the azygos level. Cardiomegaly persists. There is volume loss in an both lower lobes as seen previously. Small amount of pleural fluid. No significant change. IMPRESSION: No significant change since the study of yesterday. Volume loss/ infiltrate in the lower lobes with small effusions. Cardiomegaly. Lines and tubes satisfactory. Electronically Signed   By: Nelson Chimes M.D.   On: 10/01/2015 07:23   Dg Chest Port 1 View  Result Date: 09/30/2015 CLINICAL DATA:  Endotracheal tube evaluation.  Respiratory failure. EXAM: PORTABLE CHEST 1 VIEW COMPARISON:  Chest radiograph 09/29/2015 FINDINGS: Endotracheal  tube tip is in unchanged position at the level of the clavicular heads. Right IJ central venous catheter tip overlies the lower SVC. Left IJ approach central venous catheter tip also overlies the lower SVC. Enteric tube courses beyond the field of view. There is persistent shallow lung inflation with bilateral pleural effusions, left-greater-than-right. No pneumothorax. Left retrocardiac consolidation and streaky right basilar opacities. IMPRESSION: 1. Unchanged support apparatus. 2. Persistent bilateral small pleural effusions and associated atelectasis. 3. Unchanged left retrocardiac consolidation. Electronically Signed   By: Ulyses Jarred M.D.   On: 09/30/2015 05:53   Dg Chest Port 1 View  Result Date: 09/29/2015 CLINICAL DATA:  Central line placement EXAM: PORTABLE CHEST 1 VIEW COMPARISON:  09/29/2015 FINDINGS: Right jugular central venous catheter with the tip projecting over the SVC. Left jugular central venous catheter projecting over the SVC. Nasogastric tube coursing below the diaphragm. Endotracheal tube with the tip 2.4 cm above the carina. Small left pleural effusion. Bilateral mild interstitial thickening. No pneumothorax. Stable cardiomegaly. No acute osseous abnormality. Severe osteoarthritis of the left glenohumeral joint. IMPRESSION: 1. Endotracheal tube with the tip 2.4 cm above the carina. 2. Right jugular central venous catheter with the tip projecting over the SVC. Electronically Signed   By: Kathreen Devoid   On: 09/29/2015 18:40   Dg Chest Port 1 View  Result Date: 09/29/2015 CLINICAL DATA:  Intubated patient, status post ORIF of humerus fracture ; hypotension, acute respiratory failure, history of CHF EXAM: PORTABLE CHEST 1 VIEW COMPARISON:  Portable chest x-ray of September 28, 2015 FINDINGS: The lungs are reasonably well inflated. There are persistent bibasilar densities with obscuration of the hemidiaphragms. There small bilateral pleural effusions. The cardiac silhouette remains  enlarged. The pulmonary vascularity remains engorged. The endotracheal tube tip lies 3.3 cm above the carina. The esophagogastric tube tip projects below the inferior margin of the image. The left internal jugular venous catheter tip projects over the midportion of the SVC. External pacemaker defibrillator pads are present. IMPRESSION: CHF with mild pulmonary interstitial edema. Bibasilar atelectasis or pneumonia with small bilateral pleural effusions. The pulmonary vascular congestion is more conspicuous today as compared the previous study. Electronically Signed   By: David  Martinique M.D.   On: 09/29/2015 07:31   Dg Chest Port 1 View  Result Date: 09/28/2015 CLINICAL DATA:  Respiratory failure, history of asthma, atrial fibrillation, recent ORIF of a humeral fracture. EXAM: PORTABLE CHEST 1 VIEW COMPARISON:  Portable chest x-ray of September 27, 2015 FINDINGS: The lungs are less well inflated on the current study. There is bibasilar density consistent with atelectasis and small pleural effusions more conspicuous today. The pulmonary interstitial markings are slightly increased overall as well. The cardiac silhouette remains enlarged. There is calcification in the wall of the aortic arch. External pacemaker defibrillator pads are in stable position. The endotracheal tube tip lies 3.7 cm above the carina. The esophagogastric tube tip projects below the inferior margin of the image. The left internal jugular venous catheter tip projects over the midportion of the SVC. IMPRESSION: Mild interval deterioration in the appearance of the chest. Increased bibasilar atelectasis or infiltrate is present with small bilateral pleural effusions. No pneumothorax. Stable mild pulmonary vascular congestion with mild cardiomegaly. Aortic atherosclerosis. The support tubes are in reasonable position. Electronically Signed   By: David  Martinique M.D.   On: 09/28/2015 07:19   Dg Chest Port 1 View  Result Date: 09/27/2015 CLINICAL  DATA:  Intubation EXAM: PORTABLE CHEST 1 VIEW COMPARISON:  September 27, 2015 FINDINGS: The ETT is in good position as is a left central line. No pneumothorax. Transcutaneous pacers overlie the left chest limiting evaluation. No convincing evidence of focal infiltrate, nodule, or mass. IMPRESSION: The ETT and right central line are in good position. Electronically Signed   By: Dorise Bullion III M.D   On: 09/27/2015 15:36   Dg Chest Port 1 View  Result Date: 09/27/2015 CLINICAL DATA:  Patient has been feeling well today. Postop right humerus, low BP. Tightness in the chest with shortness of breath and wheezing. EXAM: PORTABLE CHEST 1 VIEW COMPARISON:  01/23/2014 FINDINGS: Semi-erect portable view of the chest. There is mild consolidation  now present in the left lower lobe with mild air bronchograms, suspicious for infiltrate. There is trace left-sided pleural effusion. A small amount of linear atelectasis is present in the right midlung zone. Heart size is mildly enlarged. There is atherosclerosis of the aorta. No pneumothorax. Old bilateral left greater than right rib fractures. There is post traumatic deformity of the proximal left humerus as before. IMPRESSION: Mild consolidation in the left retrocardiac space with mild air bronchograms and small effusion. Findings are suggestive of pneumonia. Mild cardiomegaly with slight central vascular congestion compared to prior. Atherosclerotic vascular disease of the aorta Electronically Signed   By: Donavan Foil M.D.   On: 09/27/2015 14:48   Dg Humerus Right  Result Date: 09/15/2015 CLINICAL DATA:  Pain after trauma EXAM: RIGHT HUMERUS - 2+ VIEW COMPARISON:  None. FINDINGS: The distal humeral fracture is again evident, extending into the intercondylar region. This is seen to better advantage on the accompanying elbow radiographs. The remainder of the humerus is intact. IMPRESSION: Distal humeral fracture with mild apex -anterior angulation. Remainder of the  humerus is intact. Electronically Signed   By: Andreas Newport M.D.   On: 09/15/2015 21:44   Dg C-arm Gt 120 Min  Result Date: 09/24/2015 CLINICAL DATA:  ORIF. EXAM: RIGHT ELBOW - 2 VIEW; DG C-ARM GT 120 MIN COMPARISON:  CT 09/15/2015. FINDINGS: ORIF distal humeral fracture. Hardware intact. Anatomic alignment. ORIF proximal ulna . Hardware intact. Anatomic alignment. Two images obtained. 0 minutes 23 seconds fluoroscopy time. IMPRESSION: Postsurgical changes about the right elbow . Electronically Signed   By: Marcello Moores  Register   On: 09/24/2015 16:19    ASSESSMENT AND PLAN:  1. Hypotension/junctional rhythm: This may all be due to sepsis from pneumonia. Bradycardia may have been due to beta blockers and calcium channel blockers which have now been stopped. She has persistent Atrial fibrillation with CVR. She is now off pressors.   2. Elevated trop with fairly flat trend most likely secondary to demand ischemia in setting of sepsis from PNA and recent trauma from fall in setting of acute kidney injury. 2D echo with low normal LVF with EF 50-55% and possible HK of the mid inferoseptal/apical septal and apical myocardium and moderate MR, moderately reduced RVF and moderate pulmonary HTN with PASP 43mmHg. EF in July was 65%. Continue ASA for now. Statin stopped due to elevated LFTs.  She did have evidence of coronary calcifications on chest CT last year. Will consider nuclear stress test once recovered from acute illness.  3. HCAP: Antibiotics per CCM   4. Chronic atrial fibrillation - her Xarelto was stopped on 9/10.  INR increased to 2.55 - this has been very erratic (6.91 on 9/10>>7.88>>1.33>>4.52>>2.55>>1.82). Most likely auto anticoagulated due to shock liver.  Her LFTs were markedly elevated but trending downward.  Started Heparin per Pulmonary - will need to watch closely for signs of bleeding. HR has been trending upward and was in the 120's throughout the night last night.  BP is stable  so will give Lopressor 2.5mg  IV now and then q4 hours and titrate as needed for HR control.  Would avoid IV Amio due to elevated LFTs and recent shock liver.  5. Recent fall with ORIF of distal humeral fx.  I have spent a total of 35 minutes with patient reviewing hospital notes , telemetry, EKGs, labs and examining patient as well as establishing an assessment and plan that was discussed with the patient.  > 50% of time was spent in direct patient  care.    Fransico Him, MD  10/02/2015  9:50 AM

## 2015-10-02 NOTE — Progress Notes (Signed)
eLink Physician-Brief Progress Note Patient Name: Cristina [name] DOB: August 05, 1940 MRN: [id number]   Date of Service  10/02/2015  HPI/Events of Note  Camera check on patient with new hypotension. Currently on CVVHD. FiO2 0.3. Attempting ultrafiltration/diuresis with CVVHD to optimize respiratory status. CVP last 8-9.   eICU Interventions  1. Nurse to contact nephrology requesting that we maintain an even volume status  2. Weaning norepinephrine to maintain mean arterial pressure & systolic blood pressure      Intervention Category Intermediate Interventions: Hypervolemia - evaluation and management  Tera Partridge 10/02/2015, 5:50 PM

## 2015-10-02 NOTE — Progress Notes (Signed)
[facility] Progress Note   Assessment/ Plan:   1. Acute kidney injury: Etiology likely ischemic ATN from profound/sustained hypotension. Continue CRRT at the current prescription for the primary goal of ultrafiltration at this time. No evidence of renal recovery-she remains anuric. Blood pressures improving.  2. Hypoxic respiratory failure/sepsis/pneumonia: Ventilator management per CCM, on levofloxacin. 3. Status post fracture of the humerus and open reduction and internal fixation. Appears to be doing well from a surgical standpoint 4. Elevated LFTs/coagulopathy: Secondary to shock liver, LFTs trending down  5. Anemia: Possibly anemia of blood loss following fracture/recent surgery as well as anemia of critical illness. Low iron saturation with elevated ferritin prohibits intravenous iron therapy at this point-will continue to monitor.  Subjective:   No acute events overnight, continues to tolerate CRRT with net negative fluid balance.    Objective:   BP 103/75   Pulse (!) 104   Temp 97.4 F (36.3 C) (Oral)   Resp (!) 24   Ht 5\' 4"  (1.626 m)   Wt 86.8 kg (191 lb 5.8 oz)   SpO2 100%   BMI 32.85 kg/m   Intake/Output Summary (Last 24 hours) at 10/02/15 0836 Last data filed at 10/02/15 0800  Gross per 24 hour  Intake             1862 ml  Output             5626 ml  Net            -3764 ml   Weight change: -4.6 kg (-10 lb 2.3 oz)  Physical Exam: [id number], comfortable on vent CVS: Pulse regular rhythm, S1 and S2 normal Resp: Coarse breath sounds bilaterally, no rales Abd: Soft, obese, nontender Ext: 1+ upper and lower extremity edema---volume status appears to be improving  Imaging: Dg Chest Portable 1 View  Result Date: 10/02/2015 CLINICAL DATA:  Respiratory failure, CHF, atrial fibrillation; status post ORIF of a humeral fracture on September 24, 2015 EXAM: PORTABLE CHEST 1 VIEW COMPARISON:  Portable chest x-ray dated October 01, 2015 FINDINGS: The  lungs remain hypoinflated. Bibasilar densities persist. The left hemidiaphragm remains obscured. The heart is mildly enlarged. The pulmonary vascularity is slightly less engorged. There is calcification in the wall of the aortic arch. The endotracheal tube tip lies 4.1 cm above the carina. The right and left internal jugular venous catheters project over the proximal and mid SVC respectively. There are did chronic changes of the left humeral head. IMPRESSION: Mild CHF, improved. Persistent bibasilar atelectasis or pneumonia with small pleural effusions. The support tubes are in stable position. Aortic atherosclerosis. Electronically Signed   By: David  Martinique M.D.   On: 10/02/2015 07:49   Dg Chest Port 1 View  Result Date: 10/01/2015 CLINICAL DATA:  Endotracheal tube support.  Shortness of breath. EXAM: PORTABLE CHEST 1 VIEW COMPARISON:  Nine hundred thirteen FINDINGS: Endotracheal tube has its tip 5 cm above the carina. Nasogastric tube enters stomach. Left internal jugular central line has its tip in the SVC 4 cm above the right atrium. Right internal jugular central line has its tip in the SVC at the azygos level. Cardiomegaly persists. There is volume loss in an both lower lobes as seen previously. Small amount of pleural fluid. No significant change. IMPRESSION: No significant change since the study of yesterday. Volume loss/ infiltrate in the lower lobes with small effusions. Cardiomegaly. Lines and tubes satisfactory. Electronically Signed   By: Nelson Chimes M.D.   On: 10/01/2015 07:23  Labs: BMET  Recent Labs Lab 09/28/15 0225 09/29/15 0500 09/30/15 0400 09/30/15 1600 10/01/15 0500 10/01/15 1607 10/02/15 0400  NA 140 140 139  140 138 138  139 138 137  K 4.6 4.4 4.7  4.8 4.4 4.2  4.2 4.3 4.4  CL 112* 108 105  106 105 104  105 104 102  CO2 20* 21* 25  24 23 27  27 26 27   GLUCOSE 139* 118* 100*  98 96 126*  127* 130* 108*  BUN 25* 38* 33*  33* 27* 26*  26* 30* 29*   CREATININE 1.90* 3.07* 2.78*  2.80* 2.18* 1.65*  1.68* 1.53* 1.30*  CALCIUM 7.6* 8.1* 8.5*  8.6* 8.6* 8.4*  8.5* 8.5* 8.8*  PHOS 5.5* 5.7* 5.4*  5.3* 2.7 2.2*  2.1* 2.7 3.0   CBC  Recent Labs Lab 09/27/15 1830  09/29/15 0500 09/30/15 0400 10/01/15 0500 10/02/15 0400  WBC 29.0*  < > 12.1* 19.2* 12.8* 18.1*  NEUTROABS 26.9*  --   --   --   --   --   HGB 10.9*  < > 8.0* 8.6* 8.4* 9.3*  HCT 36.1  < > 25.0* 28.8* 27.1* 30.5*  MCV 92.1  < > 87.7 90.9 88.3 88.2  PLT PLATELET CLUMPS NOTED ON SMEAR, COUNT APPEARS ADEQUATE  < > 210 287 235 318  < > = values in this interval not displayed.  Medications:    . sodium chloride   Intravenous Once  . aspirin  81 mg Oral Daily  . budesonide (PULMICORT) nebulizer solution  0.5 mg Nebulization BID  . chlorhexidine  15 mL Mouth Rinse BID  . citalopram  20 mg Oral QHS  . feeding supplement (PRO-STAT SUGAR FREE 64)  60 mL Per Tube BID  . hydrocortisone sodium succinate  50 mg Intravenous Q12H  . insulin aspart  0-20 Units Subcutaneous Q4H  . ipratropium-albuterol  3 mL Nebulization QID  . levofloxacin (LEVAQUIN) IV  250 mg Intravenous Q24H  . mouth rinse  15 mL Mouth Rinse QID  . pantoprazole sodium  40 mg Per Tube Daily  . pramipexole  2 mg Oral QHS  . sodium chloride  250 mL Intravenous Once  . Vitamin D (Ergocalciferol)  50,000 Units Oral Q7 days   Elmarie Shiley, MD 10/02/2015, 8:36 AM

## 2015-10-02 NOTE — Progress Notes (Signed)
PULMONARY / CRITICAL CARE MEDICINE   Name: Cristina [name] MRN: [id number] DOB: 10-14-1940    ADMISSION DATE:  09/24/2015 CONSULTATION DATE:  09/27/15  REFERRING MD: Dr. Erlinda Hong  CHIEF COMPLAINT:  Fall   SUBJECTIVE:  Remains on CRRT.  Tolerating some pressure support.  Denies chest/abd pain.  VITAL SIGNS: BP 103/75   Pulse (!) 104   Temp 97.4 F (36.3 C) (Oral)   Resp (!) 24   Ht 5\' 4"  (1.626 m)   Wt 191 lb 5.8 oz (86.8 kg)   SpO2 100%   BMI 32.85 kg/m   HEMODYNAMICS: CVP:  [5 mmHg-11 mmHg] 5 mmHg  VENTILATOR SETTINGS: Vent Mode: PRVC FiO2 (%):  [30 %-40 %] 30 % Set Rate:  [24 bmp] 24 bmp Vt Set:  [500 mL] 500 mL PEEP:  [5 cmH20] 5 cmH20 Plateau Pressure:  [14 cmH20-19 cmH20] 17 cmH20  INTAKE / OUTPUT: I/O last 3 completed shifts: In: 2874.9 [I.V.:988.9; NG/GT:1335; IV Piggyback:551] Out: 8327 [Urine:45; Other:8282]  PHYSICAL EXAMINATION: General: alert Neuro: follows simple commands HEENT: ETT in place Cardiac: irregular Chest: basilar crackles Abd: soft, non tender Ext: 1+ edema Skin: no rashes  LABS:  BMET  Recent Labs Lab 10/01/15 0500 10/01/15 1607 10/02/15 0400  NA 138  139 138 137  K 4.2  4.2 4.3 4.4  CL 104  105 104 102  CO2 27  27 26 27   BUN 26*  26* 30* 29*  CREATININE 1.65*  1.68* 1.53* 1.30*  GLUCOSE 126*  127* 130* 108*   Electrolytes  Recent Labs Lab 09/30/15 0400  10/01/15 0500 10/01/15 1607 10/02/15 0400  CALCIUM 8.5*  8.6*  < > 8.4*  8.5* 8.5* 8.8*  MG 2.5*  --  2.3  --  2.4  PHOS 5.4*  5.3*  < > 2.2*  2.1* 2.7 3.0  < > = values in this interval not displayed. CBC  Recent Labs Lab 09/30/15 0400 10/01/15 0500 10/02/15 0400  WBC 19.2* 12.8* 18.1*  HGB 8.6* 8.4* 9.3*  HCT 28.8* 27.1* 30.5*  PLT 287 235 318   Coag's  Recent Labs Lab 09/30/15 0400  10/01/15 0500 10/01/15 1400 10/02/15 0400  APTT  --   < > 44* 66* 77*  INR 2.55  --  2.28  --  1.82  < > = values in this interval not  displayed.  Sepsis Markers  Recent Labs Lab 09/27/15 1756  09/28/15 0225 09/29/15 0415 09/29/15 0500  LATICACIDVEN 2.8*  --   --   --   --   PROCALCITON  --   < > 7.26 5.96 7.43  < > = values in this interval not displayed.  ABG  Recent Labs Lab 09/30/15 0431 09/30/15 0851 10/01/15 0337  PHART 7.216* 7.154* 7.427  PCO2ART 56.2* 70.5* 39.1  PO2ART 124* 132.0* 150.0*   Liver Enzymes  Recent Labs Lab 09/29/15 0500 09/30/15 0400  10/01/15 0500 10/01/15 1607 10/02/15 0400  AST 2,724* 1,226*  --  376*  --   --   ALT 2,394* 2,091*  --  1,313*  --   --   ALKPHOS 144* 182*  --  181*  --   --   BILITOT 1.7* 1.7*  --  1.9*  --   --   ALBUMIN 2.3* 3.2*  3.2*  < > 2.8*  2.8* 2.8* 3.0*  < > = values in this interval not displayed. Cardiac Enzymes  Recent Labs Lab 09/27/15 1855 09/28/15 0225 09/28/15 0730  TROPONINI 0.08* 0.33* 0.32*  Glucose  Recent Labs Lab 10/01/15 1559 10/01/15 2016 10/02/15 0026 10/02/15 0419 10/02/15 0522 10/02/15 0735  GLUCAP 96 89 86 69 96 93   Imaging Dg Chest Portable 1 View  Result Date: 10/02/2015 CLINICAL DATA:  Respiratory failure, CHF, atrial fibrillation; status post ORIF of a humeral fracture on September 24, 2015 EXAM: PORTABLE CHEST 1 VIEW COMPARISON:  Portable chest x-ray dated October 01, 2015 FINDINGS: The lungs remain hypoinflated. Bibasilar densities persist. The left hemidiaphragm remains obscured. The heart is mildly enlarged. The pulmonary vascularity is slightly less engorged. There is calcification in the wall of the aortic arch. The endotracheal tube tip lies 4.1 cm above the carina. The right and left internal jugular venous catheters project over the proximal and mid SVC respectively. There are did chronic changes of the left humeral head. IMPRESSION: Mild CHF, improved. Persistent bibasilar atelectasis or pneumonia with small pleural effusions. The support tubes are in stable position. Aortic atherosclerosis.  Electronically Signed   By: David  Martinique M.D.   On: 10/02/2015 07:49   STUDIES:  2D echo 9/10 >> EF 99991111, mod MR, systolic function moderately reduced, PA peak pressure 54mm  CULTURES: Blood 9/10 > Sputum 9/10 > normal flora  MRSA 9/10 >  ANTIBIOTICS: Levaquin 9/10 > Vanc 9/10 > 9/13  SIGNIFICANT EVENTS: 9/07 admit after fall. Had repair of right shoulder surgery. 9/10 PCCM consulted for hypotension, bradycardia. Intubated for airway.  LINES/TUBES: L IJ CVL 9/10>>> ETT 9/11>>> R IJ HD catheter 9/12>>>  DISCUSSION: 75 yo female had fall and developed Rt distal humerus fx.  Had repair on 9/07.  Developed bradycardia, hypotension, progressive renal failure >> likely from PNA, and medications (beta blocker/calcium channel blocker).  PMHx of A fib on xarelto, HTN, HLD, Gout, Asthma, Anxiety.   ASSESSMENT / PLAN:  PULMONARY A: Acute hypoxic respiratory failure 2nd to HCAP and altered mental status. Hx of asthma. P:   Pressure support wean as tolerated >> not ready for extubation yet F/u CXR Pulmicort, prn duoneb  CARDIOVASCULAR A:  Shock - combination of septic from PNA and cardiogenic from beta blocker/calcium channel blocker induced bradycardia. Chronic A fib. Elevated troponin 2nd to demand ischemia. Moderate mitral regurgitation. Hx of HTN, HLD. P:  Heparin gtt per pharmacy Monitor hemodynamics Hold outpt lipitor  RENAL A:   AKI. P:   CRRT per renal  GASTROINTESTINAL A:   Shocked liver - improving slowly.  Nutrition. P:   F/u LFTs Tube feeds Protonix for SUP  HEMATOLOGIC A:   Anemia of critical illness. Coagulopathy - r/t shock liver >> improving. P:  F/u CBC, INR  INFECTIOUS A:   Sepsis 2nd to HCAP. P:   Day 4/7 levaquin  ENDOCRINE A:   Relative adrenal insufficiency. P:   D/c solu cortef 9/15 SSI while on tube feeds  NEUROLOGIC A:   Acute metabolic encephalopathy. Hx of anxiety, RLS. P:   RASS goal: 0- -1 Fentanyl drip and  versed prn Continue celexa, mirapex Hold outpt xanax  FAMILY  - Updates: Daughter updated at bedside 9/14.  - Inter-disciplinary family meet or Palliative Care meeting due by:  10/04/15   CC time 33 minutes   [name], MD [place] 10/02/2015, 9:30 AM Pager:  [phone] After 3pm call: [phone]

## 2015-10-03 ENCOUNTER — Inpatient Hospital Stay (HOSPITAL_COMMUNITY): Payer: Medicare Other

## 2015-10-03 DIAGNOSIS — L8990 Pressure ulcer of unspecified site, unspecified stage: Secondary | ICD-10-CM | POA: Insufficient documentation

## 2015-10-03 LAB — GLUCOSE, CAPILLARY
GLUCOSE-CAPILLARY: 103 mg/dL — AB (ref 65–99)
GLUCOSE-CAPILLARY: 105 mg/dL — AB (ref 65–99)
GLUCOSE-CAPILLARY: 109 mg/dL — AB (ref 65–99)
GLUCOSE-CAPILLARY: 109 mg/dL — AB (ref 65–99)
GLUCOSE-CAPILLARY: 74 mg/dL (ref 65–99)
GLUCOSE-CAPILLARY: 92 mg/dL (ref 65–99)
GLUCOSE-CAPILLARY: 93 mg/dL (ref 65–99)
GLUCOSE-CAPILLARY: 95 mg/dL (ref 65–99)
Glucose-Capillary: 82 mg/dL (ref 65–99)

## 2015-10-03 LAB — RENAL FUNCTION PANEL
ANION GAP: 5 (ref 5–15)
Albumin: 2.9 g/dL — ABNORMAL LOW (ref 3.5–5.0)
Albumin: 2.9 g/dL — ABNORMAL LOW (ref 3.5–5.0)
Anion gap: 4 — ABNORMAL LOW (ref 5–15)
BUN: 29 mg/dL — ABNORMAL HIGH (ref 6–20)
BUN: 27 mg/dL — ABNORMAL HIGH (ref 6–20)
CALCIUM: 8.6 mg/dL — AB (ref 8.9–10.3)
CHLORIDE: 103 mmol/L (ref 101–111)
CO2: 29 mmol/L (ref 22–32)
CO2: 29 mmol/L (ref 22–32)
CREATININE: 1.19 mg/dL — AB (ref 0.44–1.00)
CREATININE: 1.02 mg/dL — AB (ref 0.44–1.00)
Calcium: 8.7 mg/dL — ABNORMAL LOW (ref 8.9–10.3)
Chloride: 103 mmol/L (ref 101–111)
GFR, EST AFRICAN AMERICAN: 50 mL/min — AB (ref >60)
GFR, EST NON AFRICAN AMERICAN: 43 mL/min — AB (ref >60)
GFR, EST NON AFRICAN AMERICAN: 52 mL/min — AB (ref >60)
Glucose, Bld: 94 mg/dL (ref 65–99)
Glucose, Bld: 99 mg/dL (ref 65–99)
Phosphorus: 2.4 mg/dL — ABNORMAL LOW (ref 2.5–4.6)
Phosphorus: 2.2 mg/dL — ABNORMAL LOW (ref 2.5–4.6)
Potassium: 4.7 mmol/L (ref 3.5–5.1)
Potassium: 4.4 mmol/L (ref 3.5–5.1)
SODIUM: 136 mmol/L (ref 135–145)
Sodium: 137 mmol/L (ref 135–145)

## 2015-10-03 LAB — HEPATIC FUNCTION PANEL
ALT: 674 U/L — ABNORMAL HIGH (ref 14–54)
AST: 83 U/L — ABNORMAL HIGH (ref 15–41)
Albumin: 3 g/dL — ABNORMAL LOW (ref 3.5–5.0)
Alkaline Phosphatase: 173 U/L — ABNORMAL HIGH (ref 38–126)
BILIRUBIN INDIRECT: 0.9 mg/dL (ref 0.3–0.9)
Bilirubin, Direct: 0.5 mg/dL (ref 0.1–0.5)
TOTAL PROTEIN: 5.7 g/dL — AB (ref 6.5–8.1)
Total Bilirubin: 1.4 mg/dL — ABNORMAL HIGH (ref 0.3–1.2)

## 2015-10-03 LAB — PROTIME-INR
INR: 1.46
Prothrombin Time: 17.9 seconds — ABNORMAL HIGH (ref 11.4–15.2)

## 2015-10-03 LAB — CBC
HCT: 32.5 % — ABNORMAL LOW (ref 36.0–46.0)
Hemoglobin: 9.7 g/dL — ABNORMAL LOW (ref 12.0–15.0)
MCH: 27.4 pg (ref 26.0–34.0)
MCHC: 29.8 g/dL — AB (ref 30.0–36.0)
MCV: 91.8 fL (ref 78.0–100.0)
PLATELETS: 278 10*3/uL (ref 150–400)
RBC: 3.54 MIL/uL — AB (ref 3.87–5.11)
RDW: 16.1 % — ABNORMAL HIGH (ref 11.5–15.5)
WBC: 20.6 10*3/uL — ABNORMAL HIGH (ref 4.0–10.5)

## 2015-10-03 LAB — APTT: APTT: 77 s — AB (ref 24–36)

## 2015-10-03 LAB — HEPARIN LEVEL (UNFRACTIONATED): Heparin Unfractionated: 1.4 IU/mL — ABNORMAL HIGH (ref 0.30–0.70)

## 2015-10-03 LAB — MAGNESIUM: MAGNESIUM: 2.4 mg/dL (ref 1.7–2.4)

## 2015-10-03 MED ORDER — JEVITY 1.2 CAL PO LIQD
1000.0000 mL | ORAL | Status: DC
  Filled 2015-10-03 (×2): qty 1000

## 2015-10-03 MED ORDER — METOPROLOL TARTRATE 5 MG/5ML IV SOLN
2.5000 mg | INTRAVENOUS | Status: DC | PRN
  Administered 2015-10-05 – 2015-10-06 (×2): 2.5 mg via INTRAVENOUS
  Filled 2015-10-03 (×2): qty 5

## 2015-10-03 MED ORDER — ORAL CARE MOUTH RINSE
15.0000 mL | OROMUCOSAL | Status: DC
  Administered 2015-10-03 – 2015-10-14 (×107): 15 mL via OROMUCOSAL

## 2015-10-03 NOTE — Progress Notes (Signed)
ANTICOAGULATION CONSULT NOTE - Follow Up Consult  Pharmacy Consult for Heparin Indication: atrial fibrillation  Patient Measurements: Height: 5\' 4"  (162.6 cm) Weight: 184 lb 1.4 oz (83.5 kg) IBW/kg (Calculated) : 54.7 Heparin Dosing Weight: 75 kg  Vital Signs: Temp: 98.4 F (36.9 C) (09/16 0400) Temp Source: Oral (09/16 0400) BP: 99/67 (09/16 0700) Pulse Rate: 160 (09/16 0500)  Labs:  Recent Labs  10/01/15 0500 10/01/15 1400  10/02/15 0400 10/02/15 1951 10/03/15 0500  HGB 8.4*  --   --  9.3*  --  9.7*  HCT 27.1*  --   --  30.5*  --  32.5*  PLT 235  --   --  318  --  278  APTT 44* 66*  --  77*  --  77*  LABPROT 25.5*  --   --  21.3*  --  17.9*  INR 2.28  --   --  1.82  --  1.46  HEPARINUNFRC >2.20*  --   --  >2.20*  --  1.40*  CREATININE 1.65*  1.68*  --   < > 1.30* 1.21* 1.19*  < > = values in this interval not displayed.  Estimated Creatinine Clearance: 42 mL/min (by C-G formula based on SCr of 1.19 mg/dL (H)).   Assessment: 13 yoF on Xarelto PTA for atrial fibrillation now changed to IV heparin in setting of acute shock liver and CRRT. AST/ALT trending down, INR trending down. Patient noted to be high bleed risk and dosing cautiously, will target lower range of aPTT goal. aPTT remains therapeutic at 77, and heparin level remains elevated (possibly due to dose of Xarelto although the last dose was taken on 9/10). Hgb up to 9.7, plt normal, CRRT still running, and no S/Sx bleeding noted.  Goal of Therapy:  APTT 66-90 Monitor platelets by anticoagulation protocol: Yes   Plan:  -Continue heparin 1250 units/hr. -Daily aPTT, heparin level, CBC, and heparin level. -Switch to monitoring heparin levels once they correlate with aPTT. -Monitor signs and symptoms of bleeding.   Melburn Popper, PharmD Clinical Pharmacy Resident Pager: [phone] 10/03/15 7:57 AM

## 2015-10-03 NOTE — Progress Notes (Signed)
Patient ID: Cristina [name], female   DOB: Jan 15, 1941, 75 y.o.   MRN: [id number]  [facility] Progress Note   Assessment/ Plan:   1. Acute kidney injury:Likely from ischemic ATN secondary to profound/sustained hypotension. Continue CRRT at the current prescription given the lack of any renal recovery at this time-she remains anuric. Blood pressures think supported by pressors therapy.  2. Hypoxic respiratory failure/sepsis/pneumonia:Ventilator management per CCM, on levofloxacin. 3. Status post fracture of the humerus and open reduction and internal fixation. Appears to be doing well from a surgical standpoint 4.Elevated LFTs/coagulopathy:Secondary to shock liver, LFTs continue to trend down  5. Anemia:Likely anemia of critical illness/blood loss following recent fracture and surgery-low iron stores but elevated ferritin prohibits intravenous iron therapy at this time.  Subjective:   On low-dose pressors overnight-now running even on CRRT ultrafiltration goal    Objective:   BP 100/68   Pulse (!) 160   Temp 97.7 F (36.5 C) (Axillary)   Resp 16   Ht 5\' 4"  (1.626 m)   Wt 83.5 kg (184 lb 1.4 oz)   SpO2 99%   BMI 31.60 kg/m   Intake/Output Summary (Last 24 hours) at 10/03/15 [id number] Last data filed at 10/03/15 0800  Gross per 24 hour  Intake          1362.56 ml  Output             2801 ml  Net         -1438.44 ml   Weight change: -3.3 kg (-7 lb 4.4 oz)  Physical Exam: [id number], sedated CVS: Pulse irregular tachycardia, S1 and S2 normal Resp: Clear to auscultation, no distinct rales Abd: Soft, obese, nontender Ext: 1-2+ upper and lower extremity edema  Imaging: Dg Chest Port 1 View  Result Date: 10/03/2015 CLINICAL DATA:  Respiratory failure. Subsequent encounter. Intubated patient. EXAM: PORTABLE CHEST 1 VIEW COMPARISON:  10/02/2015 FINDINGS: Lung volumes are low. There is lung base opacity consistent with atelectasis. Probable small effusions. No  evidence of pulmonary edema. No pneumothorax. Next item cardiac silhouette is normal in size. No mediastinal or hilar masses. Endotracheal tube, nasal/ orogastric tube, right internal jugular and left internal jugular central lines are all stable. IMPRESSION: 1. No evidence of residual congestive heart failure. No lung base opacity is similar to the prior exam 2. The most consistent with atelectasis.  Probable small effusions. 3. Support apparatus is stable and well positioned. Electronically Signed   By: Lajean Manes M.D.   On: 10/03/2015 07:54   Dg Chest Portable 1 View  Result Date: 10/02/2015 CLINICAL DATA:  Respiratory failure, CHF, atrial fibrillation; status post ORIF of a humeral fracture on September 24, 2015 EXAM: PORTABLE CHEST 1 VIEW COMPARISON:  Portable chest x-ray dated October 01, 2015 FINDINGS: The lungs remain hypoinflated. Bibasilar densities persist. The left hemidiaphragm remains obscured. The heart is mildly enlarged. The pulmonary vascularity is slightly less engorged. There is calcification in the wall of the aortic arch. The endotracheal tube tip lies 4.1 cm above the carina. The right and left internal jugular venous catheters project over the proximal and mid SVC respectively. There are did chronic changes of the left humeral head. IMPRESSION: Mild CHF, improved. Persistent bibasilar atelectasis or pneumonia with small pleural effusions. The support tubes are in stable position. Aortic atherosclerosis. Electronically Signed   By: David  Martinique M.D.   On: 10/02/2015 07:49    Labs: BMET  Recent Labs Lab 09/30/15 0400 09/30/15 1600 10/01/15 0500 10/01/15 1607 10/02/15  0400 10/02/15 1951 10/03/15 0500  NA 139  140 138 138  139 138 137 135 137  K 4.7  4.8 4.4 4.2  4.2 4.3 4.4 5.3* 4.7  CL 105  106 105 104  105 104 102 101 103  CO2 25  24 23 27  27 26 27 29 29   GLUCOSE 100*  98 96 126*  127* 130* 108* 119* 94  BUN 33*  33* 27* 26*  26* 30* 29* 30* 29*   CREATININE 2.78*  2.80* 2.18* 1.65*  1.68* 1.53* 1.30* 1.21* 1.19*  CALCIUM 8.5*  8.6* 8.6* 8.4*  8.5* 8.5* 8.8* 8.9 8.7*  PHOS 5.4*  5.3* 2.7 2.2*  2.1* 2.7 3.0 3.1 2.4*   CBC  Recent Labs Lab 09/27/15 1830  09/30/15 0400 10/01/15 0500 10/02/15 0400 10/03/15 0500  WBC 29.0*  < > 19.2* 12.8* 18.1* 20.6*  NEUTROABS 26.9*  --   --   --   --   --   HGB 10.9*  < > 8.6* 8.4* 9.3* 9.7*  HCT 36.1  < > 28.8* 27.1* 30.5* 32.5*  MCV 92.1  < > 90.9 88.3 88.2 91.8  PLT PLATELET CLUMPS NOTED ON SMEAR, COUNT APPEARS ADEQUATE  < > 287 235 318 278  < > = values in this interval not displayed.  Medications:    . aspirin  81 mg Oral Daily  . budesonide (PULMICORT) nebulizer solution  0.5 mg Nebulization BID  . chlorhexidine  15 mL Mouth Rinse BID  . citalopram  20 mg Oral QHS  . feeding supplement (PRO-STAT SUGAR FREE 64)  60 mL Per Tube BID  . insulin aspart  0-20 Units Subcutaneous Q4H  . ipratropium-albuterol  3 mL Nebulization Q6H  . levofloxacin (LEVAQUIN) IV  250 mg Intravenous Q24H  . mouth rinse  15 mL Mouth Rinse QID  . metoprolol  2.5 mg Intravenous Q4H  . pantoprazole sodium  40 mg Per Tube Daily  . pramipexole  2 mg Oral QHS  . sodium chloride  250 mL Intravenous Once  . Vitamin D (Ergocalciferol)  50,000 Units Oral Q7 days   Elmarie Shiley, MD 10/03/2015, 8:29 AM

## 2015-10-03 NOTE — Progress Notes (Signed)
PULMONARY / CRITICAL CARE MEDICINE   Name: Cristina [name] MRN: [id number] DOB: 1940-02-06    ADMISSION DATE:  09/24/2015 CONSULTATION DATE:  09/27/15  REFERRING MD: Dr. Erlinda Hong  CHIEF COMPLAINT:  Fall   75 yo female had fall and developed Rt distal humerus fx.  Had repair on 9/07.  Developed bradycardia, hypotension, progressive renal failure >> likely from PNA, and medications (beta blocker/calcium channel blocker).  PMHx of A fib on xarelto, HTN, HLD, Gout, Asthma, Anxiety.   SUBJECTIVE:  Remains on CRRT Back on low dose levo after getting lopressor for HR Afebrile Sedated on fent gtt  VITAL SIGNS: BP 100/68   Pulse (!) 160   Temp 98.4 F (36.9 C) (Oral)   Resp 16   Ht 5\' 4"  (1.626 m)   Wt 184 lb 1.4 oz (83.5 kg)   SpO2 100%   BMI 31.60 kg/m   HEMODYNAMICS: CVP:  [5 mmHg-10 mmHg] 10 mmHg  VENTILATOR SETTINGS: Vent Mode: PRVC FiO2 (%):  [30 %] 30 % Set Rate:  [16 bmp] 16 bmp Vt Set:  [500 mL] 500 mL PEEP:  [5 cmH20] 5 cmH20 Plateau Pressure:  [16 cmH20-17 cmH20] 16 cmH20  INTAKE / OUTPUT: I/O last 3 completed shifts: In: 2291 [I.V.:871; Other:100; NG/GT:1220; IV Piggyback:100] Out: 5719 [Urine:50; Other:5669]  PHYSICAL EXAMINATION: General: chronically ill appearing, elderly, sedated on fent gtt Neuro: follows simple commands when off sedation HEENT: ETT in place Cardiac: irregular Chest: basilar crackles Abd: soft, non tender Ext: 1+ edema Skin: no rashes  LABS:  BMET  Recent Labs Lab 10/02/15 0400 10/02/15 1951 10/03/15 0500  NA 137 135 137  K 4.4 5.3* 4.7  CL 102 101 103  CO2 27 29 29   BUN 29* 30* 29*  CREATININE 1.30* 1.21* 1.19*  GLUCOSE 108* 119* 94   Electrolytes  Recent Labs Lab 10/01/15 0500  10/02/15 0400 10/02/15 1951 10/03/15 0500  CALCIUM 8.4*  8.5*  < > 8.8* 8.9 8.7*  MG 2.3  --  2.4  --  2.4  PHOS 2.2*  2.1*  < > 3.0 3.1 2.4*  < > = values in this interval not displayed. CBC  Recent Labs Lab 10/01/15 0500  10/02/15 0400 10/03/15 0500  WBC 12.8* 18.1* 20.6*  HGB 8.4* 9.3* 9.7*  HCT 27.1* 30.5* 32.5*  PLT 235 318 278   Coag's  Recent Labs Lab 10/01/15 0500 10/01/15 1400 10/02/15 0400 10/03/15 0500  APTT 44* 66* 77* 77*  INR 2.28  --  1.82 1.46    Sepsis Markers  Recent Labs Lab 09/27/15 1756  09/28/15 0225 09/29/15 0415 09/29/15 0500  LATICACIDVEN 2.8*  --   --   --   --   PROCALCITON  --   < > 7.26 5.96 7.43  < > = values in this interval not displayed.  ABG  Recent Labs Lab 09/30/15 0431 09/30/15 0851 10/01/15 0337  PHART 7.216* 7.154* 7.427  PCO2ART 56.2* 70.5* 39.1  PO2ART 124* 132.0* 150.0*   Liver Enzymes  Recent Labs Lab 09/30/15 0400  10/01/15 0500  10/02/15 0400 10/02/15 1951 10/03/15 0500  AST 1,226*  --  376*  --   --   --  83*  ALT 2,091*  --  1,313*  --   --   --  674*  ALKPHOS 182*  --  181*  --   --   --  173*  BILITOT 1.7*  --  1.9*  --   --   --  1.4*  ALBUMIN 3.2*  3.2*  < > 2.8*  2.8*  < > 3.0* 3.4* 3.0*  2.9*  < > = values in this interval not displayed. Cardiac Enzymes  Recent Labs Lab 09/27/15 1855 09/28/15 0225 09/28/15 0730  TROPONINI 0.08* 0.33* 0.32*   Glucose  Recent Labs Lab 10/02/15 1528 10/02/15 2043 10/02/15 2342 10/02/15 2354 10/03/15 0326 10/03/15 0737  GLUCAP 109* 103* 74 105* 93 82   Imaging Dg Chest Port 1 View  Result Date: 10/03/2015 CLINICAL DATA:  Respiratory failure. Subsequent encounter. Intubated patient. EXAM: PORTABLE CHEST 1 VIEW COMPARISON:  10/02/2015 FINDINGS: Lung volumes are low. There is lung base opacity consistent with atelectasis. Probable small effusions. No evidence of pulmonary edema. No pneumothorax. Next item cardiac silhouette is normal in size. No mediastinal or hilar masses. Endotracheal tube, nasal/ orogastric tube, right internal jugular and left internal jugular central lines are all stable. IMPRESSION: 1. No evidence of residual congestive heart failure. No lung base  opacity is similar to the prior exam 2. The most consistent with atelectasis.  Probable small effusions. 3. Support apparatus is stable and well positioned. Electronically Signed   By: Lajean Manes M.D.   On: 10/03/2015 07:54   STUDIES:  2D echo 9/10 >> EF 99991111, mod MR, systolic function moderately reduced, PA peak pressure 62mm  CULTURES: Blood 9/10 >ng Sputum 9/10 > normal flora  MRSA 9/10 >neg  ANTIBIOTICS: Levaquin 9/10 > Vanc 9/10 > 9/13  SIGNIFICANT EVENTS: 9/07 admit after fall. Had repair of right shoulder surgery. 9/10 PCCM consulted for hypotension, bradycardia. Intubated for airway.  LINES/TUBES: L IJ CVL 9/10>>> ETT 9/11>>> R IJ HD catheter 9/12>>>  DISCUSSION: Weaning held up by tachycardia & hyptoension  ASSESSMENT / PLAN:  PULMONARY A: Acute hypoxic respiratory failure 2nd to HCAP and altered mental status. Hx of asthma. P:   SBts once off pressors >> not ready for extubation yet Pulmicort, prn duoneb  CARDIOVASCULAR A:  Shock - combination of septic from PNA and cardiogenic from beta blocker/calcium channel blocker induced bradycardia. Chronic A fib. Elevated troponin 2nd to demand ischemia. Moderate mitral regurgitation. Hx of HTN, HLD. P:  Heparin gtt per pharmacy Hold outpt lipitor Hold lopressor while on levophed  RENAL A:   AKI. P:   CRRT per renal -keep even balance  GASTROINTESTINAL A:   Shocked liver - improving slowly.  Nutrition. P:   F/u LFTs Tube feeds Protonix for SUP  HEMATOLOGIC A:   Anemia of critical illness. Coagulopathy - r/t shock liver >> improving. P:  F/u CBC, INR  INFECTIOUS A:   Sepsis 2nd to HCAP. P:   Day 5/7 levaquin  ENDOCRINE A:   Relative adrenal insufficiency. P:   D/c solu cortef 9/15 SSI while on tube feeds  NEUROLOGIC A:   Acute metabolic encephalopathy. Hx of anxiety, RLS. P:   RASS goal: 0- -1 Fentanyl drip and versed prn Continue celexa, mirapex Hold outpt  xanax  FAMILY  - Updates: Daughters updated at bedside 9/16 - Inter-disciplinary family meet or Palliative Care meeting due by:  10/04/15   CC time 35 minutes   Kara Mead MD. [facility]. [place] [facility] Pager [phone] If no response call [phone]   10/03/2015

## 2015-10-03 NOTE — Progress Notes (Signed)
Patient Name: Cristina [name] Date of Encounter: 10/03/2015  Active Problems:   S/P ORIF (open reduction internal fixation) fracture   Hypotension due to drugs   Bradycardia   Acute respiratory failure with hypoxia (HCC)   Elevated troponin   Orthostatic hypotension   Encounter for central line placement   Persistent atrial fibrillation ([place])   Pressure ulcer   Length of Stay: 7  SUBJECTIVE  Sedated, intubated. Remains in AF with controlled ventricular rate, but SBP trending down to low 80s  CURRENT MEDS . aspirin  81 mg Oral Daily  . budesonide (PULMICORT) nebulizer solution  0.5 mg Nebulization BID  . chlorhexidine  15 mL Mouth Rinse BID  . citalopram  20 mg Oral QHS  . feeding supplement (PRO-STAT SUGAR FREE 64)  60 mL Per Tube BID  . insulin aspart  0-20 Units Subcutaneous Q4H  . ipratropium-albuterol  3 mL Nebulization Q6H  . levofloxacin (LEVAQUIN) IV  250 mg Intravenous Q24H  . mouth rinse  15 mL Mouth Rinse QID  . metoprolol  2.5 mg Intravenous Q4H  . pantoprazole sodium  40 mg Per Tube Daily  . pramipexole  2 mg Oral QHS  . sodium chloride  250 mL Intravenous Once  . Vitamin D (Ergocalciferol)  50,000 Units Oral Q7 days    OBJECTIVE   Intake/Output Summary (Last 24 hours) at 10/03/15 0923 Last data filed at 10/03/15 0900  Gross per 24 hour  Intake          1225.06 ml  Output             2681 ml  Net         -1455.94 ml   Filed Weights   10/01/15 0400 10/02/15 0341 10/03/15 0407  Weight: 91.4 kg (201 lb 8 oz) 86.8 kg (191 lb 5.8 oz) 83.5 kg (184 lb 1.4 oz)    PHYSICAL EXAM Vitals:   10/03/15 0600 10/03/15 0700 10/03/15 0756 10/03/15 0800  BP: (!) 97/55 99/67 100/68   Pulse:      Resp: 18 16 16    Temp:    97.7 F (36.5 C)  TempSrc:    Axillary  SpO2:   100% 99%  Weight:      Height:       General: sedated Head: no evidence of trauma, PERRL, EOMI, no exophtalmos or lid lag, no myxedema, no xanthelasma; normal ears, nose and  oropharynx Neck: normal jugular venous pulsations and no hepatojugular reflux; brisk carotid pulses without delay and no carotid bruits Chest: clear to auscultation, no signs of consolidation by percussion or palpation, normal fremitus, symmetrical and full respiratory excursions Cardiovascular: normal position and quality of the apical impulse, irregular rhythm, normal first and second heart sounds, no rubs or gallops, no murmur Abdomen: no tenderness or distention, no masses by palpation, no abnormal pulsatility or arterial bruits, normal bowel sounds, no hepatosplenomegaly Extremities: no clubbing, cyanosis or edema; Neurological: grossly nonfocal, sedated  LABS  CBC  Recent Labs  10/02/15 0400 10/03/15 0500  WBC 18.1* 20.6*  HGB 9.3* 9.7*  HCT 30.5* 32.5*  MCV 88.2 91.8  PLT 318 0000000   Basic Metabolic Panel  Recent Labs  10/02/15 0400 10/02/15 1951 10/03/15 0500  NA 137 135 137  K 4.4 5.3* 4.7  CL 102 101 103  CO2 27 29 29   GLUCOSE 108* 119* 94  BUN 29* 30* 29*  CREATININE 1.30* 1.21* 1.19*  CALCIUM 8.8* 8.9 8.7*  MG 2.4  --  2.4  PHOS 3.0 3.1 2.4*   Liver Function Tests  Recent Labs  10/01/15 0500  10/02/15 1951 10/03/15 0500  AST 376*  --   --  83*  ALT 1,313*  --   --  674*  ALKPHOS 181*  --   --  173*  BILITOT 1.9*  --   --  1.4*  PROT 5.6*  --   --  5.7*  ALBUMIN 2.8*  2.8*  < > 3.4* 3.0*  2.9*  < > = values in this interval not displayed. No results for input(s): LIPASE, AMYLASE in the last 72 hours.  Radiology Studies Imaging results have been reviewed and Dg Chest Port 1 View  Result Date: 10/03/2015 CLINICAL DATA:  Respiratory failure. Subsequent encounter. Intubated patient. EXAM: PORTABLE CHEST 1 VIEW COMPARISON:  10/02/2015 FINDINGS: Lung volumes are low. There is lung base opacity consistent with atelectasis. Probable small effusions. No evidence of pulmonary edema. No pneumothorax. Next item cardiac silhouette is normal in size. No  mediastinal or hilar masses. Endotracheal tube, nasal/ orogastric tube, right internal jugular and left internal jugular central lines are all stable. IMPRESSION: 1. No evidence of residual congestive heart failure. No lung base opacity is similar to the prior exam 2. The most consistent with atelectasis.  Probable small effusions. 3. Support apparatus is stable and well positioned. Electronically Signed   By: Lajean Manes M.D.   On: 10/03/2015 07:54   Dg Chest Portable 1 View  Result Date: 10/02/2015 CLINICAL DATA:  Respiratory failure, CHF, atrial fibrillation; status post ORIF of a humeral fracture on September 24, 2015 EXAM: PORTABLE CHEST 1 VIEW COMPARISON:  Portable chest x-ray dated October 01, 2015 FINDINGS: The lungs remain hypoinflated. Bibasilar densities persist. The left hemidiaphragm remains obscured. The heart is mildly enlarged. The pulmonary vascularity is slightly less engorged. There is calcification in the wall of the aortic arch. The endotracheal tube tip lies 4.1 cm above the carina. The right and left internal jugular venous catheters project over the proximal and mid SVC respectively. There are did chronic changes of the left humeral head. IMPRESSION: Mild CHF, improved. Persistent bibasilar atelectasis or pneumonia with small pleural effusions. The support tubes are in stable position. Aortic atherosclerosis. Electronically Signed   By: David  Martinique M.D.   On: 10/02/2015 07:49    TELE Afib  ECG Afib (09/27/15)  ASSESSMENT AND PLAN  1. Sepsis: still requiring pressors. She was sedated a little more for agitation after her bath. This may have increased pressor requirement.   2. Elevated troponin most likely secondary to demand ischemia in setting of sepsis from PNA and recent trauma from fall in setting of acute kidney injury. Normal LVEF 50-55%, but possible hypokinesis of the mid inferoseptal/apical septal and apical myocardium and moderate MR, moderately reduced RVF and  moderate pulmonary HTN with PASP 75mmHg. EF in July was 65%. Continue ASA for now. Statin stopped due to elevated LFTs. She did have evidence of coronary calcifications on chest CT last year. Nuclear stress test once recovered from acute illness.  3. Pneumonia: Antibiotics per CCM   4. Chronic atrial fibrillation - junctional rhythm earlier due to sepsis, beta blockers and calcium channel blockers, has resolved. Xarelto was stopped on 9/10.Started Heparin IV yesterday. Decision on long term anticoagulant will depend on progress of renal and liver function. Stopped scheduled beta blocker due to hypotension. Avoiding IV Amio due to elevated LFTs and recent shock liver, but these are improving steadily and this will likely be best option  unless BP improves.  5. Recent fall with ORIF of distal humeral fx.   Sanda Klein, MD, [facility] [phone] office [phone] pager 10/03/2015 9:23 AM

## 2015-10-03 NOTE — Progress Notes (Signed)
Nutrition Follow-up  DOCUMENTATION CODES:   Obesity unspecified  INTERVENTION:  Continue Vital High Protein at goal of 25 ml/hr (600 ml/day) via OG tube  Continue 60 ml Prostat BID Provides: 1000 kcal, 112 grams protein, and 501 ml H2O.   NUTRITION DIAGNOSIS:   Inadequate oral intake related to inability to eat as evidenced by NPO status.  Ongoing  GOAL:   Provide needs based on ASPEN/SCCM guidelines  Unmet  MONITOR:   TF tolerance, Skin, I & O's, Labs, Vent status  REASON FOR ASSESSMENT:   Ventilator, Consult Enteral/tube feeding initiation and management  ASSESSMENT:   88 female, admitted after a fall, had repair of her right shoulder on 9/7, don't have chronic atrial fibrillation, becoming hypotensive overnight and noted to be bradycardic today. Was diaphoretic. Noted to be junctional rhythm with nonspecific ST-T wave changes. Intubated for airway protection.   9/12 started CRRT for AKI (per renal likely due to ischemic ATN from profound/sustained hypotension), pt anuric 9/12 TF held when ventilator dyssynchrony continued despite treatment. NGT to LIS 9/13 TF restarted at 10 ml/hr and is advancing as tolerated  9/16 C/S TF management. Remains on CRRT. Weight trending down. Per RN, consult was a mistake, TF at goal and pt is tolerating well.   Patient is currently intubated on ventilator support MV: 7.8 L/min Temp (24hrs), Avg:98 F (36.7 C), Min:97.4 F (36.3 C), Max:98.4 F (36.9 C)  Labs: high ALT, elevated AST, low hemoglobin (trending up), low phosphorus  Diet Order:  Diet NPO time specified  Skin:  Wound (see comment) (unstageable pressure ulcer to L heel, lower leg; incision on elbow)  Last BM:  9/9  Height:   Ht Readings from Last 1 Encounters:  10/01/15 5\' 4"  (1.626 m)    Weight:   Wt Readings from Last 1 Encounters:  10/03/15 184 lb 1.4 oz (83.5 kg)    Ideal Body Weight:  54.5 kg  BMI:  Body mass index is 31.6 kg/m.  Estimated  Nutritional Needs:   Kcal:  [id number]  Protein:  >/= 109 grams  Fluid:  > 1.5 L/day  EDUCATION NEEDS:   No education needs identified at this time  Scarlette Ar RD, LDN, CSP Inpatient Clinical Dietitian Pager: [phone] After Hours Pager: [phone]

## 2015-10-04 ENCOUNTER — Inpatient Hospital Stay (HOSPITAL_COMMUNITY): Payer: Medicare Other

## 2015-10-04 DIAGNOSIS — R6520 Severe sepsis without septic shock: Secondary | ICD-10-CM

## 2015-10-04 DIAGNOSIS — N170 Acute kidney failure with tubular necrosis: Secondary | ICD-10-CM

## 2015-10-04 LAB — GLUCOSE, CAPILLARY
GLUCOSE-CAPILLARY: 107 mg/dL — AB (ref 65–99)
GLUCOSE-CAPILLARY: 107 mg/dL — AB (ref 65–99)
Glucose-Capillary: 103 mg/dL — ABNORMAL HIGH (ref 65–99)

## 2015-10-04 LAB — RENAL FUNCTION PANEL
ALBUMIN: 2.7 g/dL — AB (ref 3.5–5.0)
Albumin: 2.7 g/dL — ABNORMAL LOW (ref 3.5–5.0)
Anion gap: 5 (ref 5–15)
Anion gap: 4 — ABNORMAL LOW (ref 5–15)
BUN: 24 mg/dL — AB (ref 6–20)
BUN: 24 mg/dL — ABNORMAL HIGH (ref 6–20)
CALCIUM: 8.7 mg/dL — AB (ref 8.9–10.3)
CHLORIDE: 103 mmol/L (ref 101–111)
CO2: 30 mmol/L (ref 22–32)
CO2: 28 mmol/L (ref 22–32)
Calcium: 8.4 mg/dL — ABNORMAL LOW (ref 8.9–10.3)
Chloride: 102 mmol/L (ref 101–111)
Creatinine, Ser: 1.07 mg/dL — ABNORMAL HIGH (ref 0.44–1.00)
Creatinine, Ser: 1.02 mg/dL — ABNORMAL HIGH (ref 0.44–1.00)
GFR calc Af Amer: 57 mL/min — ABNORMAL LOW (ref >60)
GFR calc non Af Amer: 49 mL/min — ABNORMAL LOW (ref >60)
GFR calc non Af Amer: 52 mL/min — ABNORMAL LOW (ref >60)
GLUCOSE: 108 mg/dL — AB (ref 65–99)
Glucose, Bld: 115 mg/dL — ABNORMAL HIGH (ref 65–99)
PHOSPHORUS: 1.9 mg/dL — AB (ref 2.5–4.6)
POTASSIUM: 4.1 mmol/L (ref 3.5–5.1)
Phosphorus: 1.9 mg/dL — ABNORMAL LOW (ref 2.5–4.6)
Potassium: 4.3 mmol/L (ref 3.5–5.1)
SODIUM: 137 mmol/L (ref 135–145)
Sodium: 135 mmol/L (ref 135–145)

## 2015-10-04 LAB — CBC
HCT: 31.6 % — ABNORMAL LOW (ref 36.0–46.0)
Hemoglobin: 9.2 g/dL — ABNORMAL LOW (ref 12.0–15.0)
MCH: 27 pg (ref 26.0–34.0)
MCHC: 29.1 g/dL — AB (ref 30.0–36.0)
MCV: 92.7 fL (ref 78.0–100.0)
PLATELETS: 245 10*3/uL (ref 150–400)
RBC: 3.41 MIL/uL — ABNORMAL LOW (ref 3.87–5.11)
RDW: 16.2 % — AB (ref 11.5–15.5)
WBC: 17.7 10*3/uL — ABNORMAL HIGH (ref 4.0–10.5)

## 2015-10-04 LAB — PROTIME-INR
INR: 1.32
PROTHROMBIN TIME: 16.5 s — AB (ref 11.4–15.2)

## 2015-10-04 LAB — APTT
APTT: 95 s — AB (ref 24–36)
APTT: 94 s — AB (ref 24–36)

## 2015-10-04 LAB — HEPARIN LEVEL (UNFRACTIONATED): HEPARIN UNFRACTIONATED: 0.9 [IU]/mL — AB (ref 0.30–0.70)

## 2015-10-04 LAB — MAGNESIUM: Magnesium: 2.4 mg/dL (ref 1.7–2.4)

## 2015-10-04 MED ORDER — HEPARIN (PORCINE) IN NACL 100-0.45 UNIT/ML-% IJ SOLN
1150.0000 [IU]/h | INTRAMUSCULAR | Status: AC
  Administered 2015-10-04 – 2015-10-06 (×3): 1150 [IU]/h via INTRAVENOUS
  Filled 2015-10-04 (×3): qty 250

## 2015-10-04 MED ORDER — ALBUMIN HUMAN 25 % IV SOLN
12.5000 g | Freq: Once | INTRAVENOUS | Status: AC
  Administered 2015-10-04: 12.5 g via INTRAVENOUS
  Filled 2015-10-04: qty 50

## 2015-10-04 NOTE — Progress Notes (Signed)
ANTICOAGULATION CONSULT NOTE - Follow Up Consult  Pharmacy Consult for Heparin Indication: atrial fibrillation  Patient Measurements: Height: 5\' 4"  (162.6 cm) Weight: 185 lb 3 oz (84 kg) IBW/kg (Calculated) : 54.7 Heparin Dosing Weight: 75 kg  Vital Signs: Temp: 97.6 F (36.4 C) (09/17 1600) Temp Source: Oral (09/17 1600) BP: 128/84 (09/17 1600) Pulse Rate: 126 (09/17 1600)  Labs:  Recent Labs  10/02/15 0400  10/03/15 0500 10/03/15 1600 10/04/15 0750 10/04/15 1616  HGB 9.3*  --  9.7*  --  9.2*  --   HCT 30.5*  --  32.5*  --  31.6*  --   PLT 318  --  278  --  245  --   APTT 77*  --  77*  --  95* 94*  LABPROT 21.3*  --  17.9*  --  16.5*  --   INR 1.82  --  1.46  --  1.32  --   HEPARINUNFRC >2.20*  --  1.40*  --  0.90*  --   CREATININE 1.30*  < > 1.19* 1.02* 1.07*  --   < > = values in this interval not displayed.  Estimated Creatinine Clearance: 46.9 mL/min (by C-G formula based on SCr of 1.07 mg/dL (H)).   Assessment: 20 yoF on Xarelto PTA for atrial fibrillation now changed to IV heparin in setting of acute shock liver and CRRT. AST/ALT trending down, INR trending down. Patient noted to be high bleed risk and dosing cautiously, will target lower range of aPTT goal.  APTT this am slightly above goal at 94. Hgb remains low but stable, plt normal, CRRT still running, and no S/Sx bleeding noted.  Goal of Therapy:  APTT 66-90 Monitor platelets by anticoagulation protocol: Yes   Plan:  -Continue IV heparin at current rate. -Daily aPTT, heparin level, CBC, and heparin level -Switch to monitoring heparin levels once they correlate with aPTT -Monitor signs and symptoms of bleeding  Uvaldo Rising, BCPS  Clinical Pharmacist Pager [phone]  10/04/2015 5:33 PM

## 2015-10-04 NOTE — Progress Notes (Signed)
PULMONARY / CRITICAL CARE MEDICINE   Name: Cristina [name] MRN: [id number] DOB: 01/21/1940    ADMISSION DATE:  09/24/2015 CONSULTATION DATE:  09/27/15  REFERRING MD: Dr. Erlinda Hong  CHIEF COMPLAINT:  Fall   75 yo female had fall and developed Rt distal humerus fx.  Had repair on 9/07.  Developed bradycardia, hypotension, progressive renal failure >> likely from PNA, and medications (beta blocker/calcium channel blocker).  PMHx of A fib on xarelto, HTN, HLD, Gout, Asthma, Anxiety.   SUBJECTIVE:  Remains on CRRT remains on low dose levo, CVP 3 Afebrile Sedated on fent gtt  VITAL SIGNS: BP 93/78 (BP Location: Right Leg)   Pulse (!) 123   Temp 97.6 F (36.4 C) (Oral)   Resp (!) 24   Ht 5\' 4"  (1.626 m)   Wt 84 kg (185 lb 3 oz)   SpO2 100%   BMI 31.79 kg/m   HEMODYNAMICS: CVP:  [3 mmHg-15 mmHg] 9 mmHg  VENTILATOR SETTINGS: Vent Mode: PRVC FiO2 (%):  [30 %] 30 % Set Rate:  [16 bmp] 16 bmp Vt Set:  [500 mL] 500 mL PEEP:  [5 cmH20] 5 cmH20 Plateau Pressure:  [15 cmH20-19 cmH20] 17 cmH20  INTAKE / OUTPUT: I/O last 3 completed shifts: In: 2665.1 [I.V.:1625.1; NG/GT:990; IV Piggyback:50] Out: 3027 [Urine:38; Other:2989]  PHYSICAL EXAMINATION: General: chronically ill appearing, elderly, sedated on fent gtt Neuro: follows simple commands when off sedation HEENT: ETT in place Cardiac: irregular Chest: basilar crackles Abd: soft, non tender Ext: 1+ edema, rt arm sling bandage Skin: no rashes  LABS:  BMET  Recent Labs Lab 10/03/15 0500 10/03/15 1600 10/04/15 0750  NA 137 136 137  K 4.7 4.4 4.3  CL 103 103 102  CO2 29 29 30   BUN 29* 27* 24*  CREATININE 1.19* 1.02* 1.07*  GLUCOSE 94 99 108*   Electrolytes  Recent Labs Lab 10/02/15 0400  10/03/15 0500 10/03/15 1600 10/04/15 0750  CALCIUM 8.8*  < > 8.7* 8.6* 8.7*  MG 2.4  --  2.4  --  2.4  PHOS 3.0  < > 2.4* 2.2* 1.9*  < > = values in this interval not displayed. CBC  Recent Labs Lab 10/02/15 0400  10/03/15 0500 10/04/15 0750  WBC 18.1* 20.6* 17.7*  HGB 9.3* 9.7* 9.2*  HCT 30.5* 32.5* 31.6*  PLT 318 278 245   Coag's  Recent Labs Lab 10/02/15 0400 10/03/15 0500 10/04/15 0750  APTT 77* 77* 95*  INR 1.82 1.46 1.32    Sepsis Markers  Recent Labs Lab 09/27/15 1756  09/28/15 0225 09/29/15 0415 09/29/15 0500  LATICACIDVEN 2.8*  --   --   --   --   PROCALCITON  --   < > 7.26 5.96 7.43  < > = values in this interval not displayed.  ABG  Recent Labs Lab 09/30/15 0431 09/30/15 0851 10/01/15 0337  PHART 7.216* 7.154* 7.427  PCO2ART 56.2* 70.5* 39.1  PO2ART 124* 132.0* 150.0*   Liver Enzymes  Recent Labs Lab 09/30/15 0400  10/01/15 0500  10/03/15 0500 10/03/15 1600 10/04/15 0750  AST 1,226*  --  376*  --  83*  --   --   ALT 2,091*  --  1,313*  --  674*  --   --   ALKPHOS 182*  --  181*  --  173*  --   --   BILITOT 1.7*  --  1.9*  --  1.4*  --   --   ALBUMIN 3.2*  3.2*  < >  2.8*  2.8*  < > 3.0*  2.9* 2.9* 2.7*  < > = values in this interval not displayed. Cardiac Enzymes  Recent Labs Lab 09/27/15 1855 09/28/15 0225 09/28/15 0730  TROPONINI 0.08* 0.33* 0.32*   Glucose  Recent Labs Lab 10/03/15 1128 10/03/15 1556 10/03/15 1941 10/03/15 1943 10/03/15 2347 10/04/15 0308  GLUCAP 92 95 38* 109* 109* 107*   Imaging Dg Chest Port 1 View  Result Date: 10/04/2015 CLINICAL DATA:  For Acute respiratory failure. Hx of Asthma, Afib, HTN and Pneumonia. EXAM: PORTABLE CHEST 1 VIEW COMPARISON:  10/03/2015 FINDINGS: Endotracheal tube, NG tube and central venous line unchanged. Stable cardiac silhouette. There is LEFT basilar atelectasis again noted. There is improvement in aeration of the lung bases. No pneumothorax. IMPRESSION: 1. Stable support apparatus. 2. Improved aeration lung bases. Electronically Signed   By: Suzy Bouchard M.D.   On: 10/04/2015 08:38   STUDIES:  2D echo 9/10 >> EF [id number], mod MR, systolic function moderately reduced, PA peak  pressure 71mm  CULTURES: Blood 9/10 >ng Sputum 9/10 > normal flora  MRSA 9/10 >neg  ANTIBIOTICS: Levaquin 9/10 > Vanc 9/10 > 9/13  SIGNIFICANT EVENTS: 9/07 admit after fall. Had repair of right shoulder surgery. 9/10 PCCM consulted for hypotension, bradycardia. Intubated for airway.  LINES/TUBES: L IJ CVL 9/10>>> ETT 9/11>>> R IJ HD catheter 9/12>>>  DISCUSSION: Weaning held up by AF-RVR & hyptoension  ASSESSMENT / PLAN:  PULMONARY A: Acute hypoxic respiratory failure 2nd to HCAP and altered mental status. Hx of asthma. P:   SBts once off pressors >> not ready for extubation yet Pulmicort, prn duoneb  CARDIOVASCULAR A:  Shock - combination of septic from PNA and cardiogenic from beta blocker/calcium channel blocker induced bradycardia. Chronic A fib. Elevated troponin 2nd to demand ischemia. Moderate mitral regurgitation. Hx of HTN, HLD. P:  Appears dry - albumin x 1 Heparin gtt per pharmacy, xarelto stopped 9/10 Hold outpt lipitor Hold lopressor while on levophed, trying to avoid amio here Nuclear stress test once recovered  RENAL A:   AKI. P:   CRRT per renal -keep even balance   GASTROINTESTINAL A:   Shock liver - improving slowly.  Nutrition. P:   F/u LFTs Tube feeds Protonix for SUP  HEMATOLOGIC A:   Anemia of critical illness. Coagulopathy - r/t shock liver >> improving. P:  F/u CBC, INR  INFECTIOUS A:   Sepsis 2nd to HCAP. P:   Day 6/7 levaquin  ENDOCRINE A:   Relative adrenal insufficiency. P:   D/c solu cortef 9/15 SSI while on tube feeds  NEUROLOGIC A:   Acute metabolic encephalopathy. Hx of anxiety, RLS. P:   RASS goal: 0- -1 Fentanyl drip and versed prn Continue celexa, mirapex Hold outpt xanax  FAMILY  - Updates: Daughters updated at bedside 9/16 - Inter-disciplinary family meet or Palliative Care meeting due by:  9/16, RN present   CC time 66 minutes   Kara Mead MD. [facility]. [facility] Pager [phone] If no response call [phone]   10/04/2015

## 2015-10-04 NOTE — Progress Notes (Signed)
Patient ID: Cristina [name], female   DOB: 13-Sep-1940, 75 y.o.   MRN: [id number]   [facility] Progress Note   Assessment/ Plan:   1. Acute kidney injury:etiology likely ischemic ATN secondary to profound/sustained hypotension. She remains anuric and without any signs of renal recovery at this point, continue CRRT at this time, on low-dose pressors so unable to transition to intermittent hemodialysis. Continue ultrafiltration as permitted by blood pressures.   2. Hypoxic respiratory failure/sepsis/pneumonia:Ventilator management per CCM, on levofloxacin. 3. Status post fracture of the humerus and open reduction and internal fixation. Appears to be doing well from a surgical standpoint 4.Elevated LFTs/coagulopathy:Secondary to shock liver, LFTs continue to trend down  5. Anemia:Likely anemia of critical illness/blood loss following recent fracture and surgery-low iron stores but elevated ferritin prohibits intravenous iron therapy at this time.  Subjective:   No acute events overnight, remains on low-dose pressors and tolerating CRRT.    Objective:   BP 93/78 (BP Location: Right Leg)   Pulse (!) 123   Temp 97.6 F (36.4 C) (Oral)   Resp (!) 24   Ht 5\' 4"  (1.626 m)   Wt 84 kg (185 lb 3 oz)   SpO2 100%   BMI 31.79 kg/m   Intake/Output Summary (Last 24 hours) at 10/04/15 0900 Last data filed at 10/04/15 0800  Gross per 24 hour  Intake          1963.35 ml  Output             2054 ml  Net           -90.65 ml   Weight change: 0.5 kg (1 lb 1.6 oz)  Physical Exam: [id number], sedated CVS: Pulse irregular tachycardia, S1 and S2 normal Resp: Clear to auscultation, no distinct rales Abd: Soft, obese, nontender Ext: 1+ upper and lower extremity edema-visibly better over the last 48hours.  Imaging: Dg Chest Port 1 View  Result Date: 10/04/2015 CLINICAL DATA:  For Acute respiratory failure. Hx of Asthma, Afib, HTN and Pneumonia. EXAM: PORTABLE CHEST 1 VIEW  COMPARISON:  10/03/2015 FINDINGS: Endotracheal tube, NG tube and central venous line unchanged. Stable cardiac silhouette. There is LEFT basilar atelectasis again noted. There is improvement in aeration of the lung bases. No pneumothorax. IMPRESSION: 1. Stable support apparatus. 2. Improved aeration lung bases. Electronically Signed   By: Suzy Bouchard M.D.   On: 10/04/2015 08:38   Dg Chest Port 1 View  Result Date: 10/03/2015 CLINICAL DATA:  Respiratory failure. Subsequent encounter. Intubated patient. EXAM: PORTABLE CHEST 1 VIEW COMPARISON:  10/02/2015 FINDINGS: Lung volumes are low. There is lung base opacity consistent with atelectasis. Probable small effusions. No evidence of pulmonary edema. No pneumothorax. Next item cardiac silhouette is normal in size. No mediastinal or hilar masses. Endotracheal tube, nasal/ orogastric tube, right internal jugular and left internal jugular central lines are all stable. IMPRESSION: 1. No evidence of residual congestive heart failure. No lung base opacity is similar to the prior exam 2. The most consistent with atelectasis.  Probable small effusions. 3. Support apparatus is stable and well positioned. Electronically Signed   By: Lajean Manes M.D.   On: 10/03/2015 07:54    Labs: BMET  Recent Labs Lab 10/01/15 0500 10/01/15 1607 10/02/15 0400 10/02/15 1951 10/03/15 0500 10/03/15 1600 10/04/15 0750  NA 138  139 138 137 135 137 136 137  K 4.2  4.2 4.3 4.4 5.3* 4.7 4.4 4.3  CL 104  105 104 102 101 103 103 102  CO2 27  27 26 27 29 29 29 30   GLUCOSE 126*  127* 130* 108* 119* 94 99 108*  BUN 26*  26* 30* 29* 30* 29* 27* 24*  CREATININE 1.65*  1.68* 1.53* 1.30* 1.21* 1.19* 1.02* 1.07*  CALCIUM 8.4*  8.5* 8.5* 8.8* 8.9 8.7* 8.6* 8.7*  PHOS 2.2*  2.1* 2.7 3.0 3.1 2.4* 2.2* 1.9*   CBC  Recent Labs Lab 09/27/15 1830  10/01/15 0500 10/02/15 0400 10/03/15 0500 10/04/15 0750  WBC 29.0*  < > 12.8* 18.1* 20.6* 17.7*  NEUTROABS 26.9*  --   --    --   --   --   HGB 10.9*  < > 8.4* 9.3* 9.7* 9.2*  HCT 36.1  < > 27.1* 30.5* 32.5* 31.6*  MCV 92.1  < > 88.3 88.2 91.8 92.7  PLT PLATELET CLUMPS NOTED ON SMEAR, COUNT APPEARS ADEQUATE  < > 235 318 278 245  < > = values in this interval not displayed.  Medications:    . albumin human  12.5 g Intravenous Once  . aspirin  81 mg Oral Daily  . budesonide (PULMICORT) nebulizer solution  0.5 mg Nebulization BID  . chlorhexidine  15 mL Mouth Rinse BID  . citalopram  20 mg Oral QHS  . feeding supplement (PRO-STAT SUGAR FREE 64)  60 mL Per Tube BID  . insulin aspart  0-20 Units Subcutaneous Q4H  . ipratropium-albuterol  3 mL Nebulization Q6H  . mouth rinse  15 mL Mouth Rinse 10 times per day  . pantoprazole sodium  40 mg Per Tube Daily  . pramipexole  2 mg Oral QHS  . sodium chloride  250 mL Intravenous Once  . Vitamin D (Ergocalciferol)  50,000 Units Oral Q7 days   Elmarie Shiley, MD 10/04/2015, 9:00 AM

## 2015-10-04 NOTE — Progress Notes (Signed)
Patient Name: Cristina [name] Date of Encounter: 10/04/2015  Active Problems:   S/P ORIF (open reduction internal fixation) fracture   Hypotension due to drugs   Bradycardia   Acute respiratory failure with hypoxia (HCC)   Elevated troponin   Orthostatic hypotension   Encounter for central line placement   Persistent atrial fibrillation ([place])   Pressure ulcer   Length of Stay: 8  SUBJECTIVE  Sedated, intubated. Opens eyes. Remains essentially anuric with excellent metabolic compensation with CRRT. Prssor requirement slightly lower, as is WBC.  CURRENT MEDS . albumin human  12.5 g Intravenous Once  . aspirin  81 mg Oral Daily  . budesonide (PULMICORT) nebulizer solution  0.5 mg Nebulization BID  . chlorhexidine  15 mL Mouth Rinse BID  . citalopram  20 mg Oral QHS  . feeding supplement (PRO-STAT SUGAR FREE 64)  60 mL Per Tube BID  . insulin aspart  0-20 Units Subcutaneous Q4H  . ipratropium-albuterol  3 mL Nebulization Q6H  . mouth rinse  15 mL Mouth Rinse 10 times per day  . pantoprazole sodium  40 mg Per Tube Daily  . pramipexole  2 mg Oral QHS  . sodium chloride  250 mL Intravenous Once  . Vitamin D (Ergocalciferol)  50,000 Units Oral Q7 days    OBJECTIVE   Intake/Output Summary (Last 24 hours) at 10/04/15 0931 Last data filed at 10/04/15 0900  Gross per 24 hour  Intake          2033.35 ml  Output             2132 ml  Net           -98.65 ml   Filed Weights   10/02/15 0341 10/03/15 0407 10/04/15 0400  Weight: 86.8 kg (191 lb 5.8 oz) 83.5 kg (184 lb 1.4 oz) 84 kg (185 lb 3 oz)    PHYSICAL EXAM Vitals:   10/04/15 0700 10/04/15 0756 10/04/15 0800 10/04/15 0900  BP: (!) 110/92 104/80 93/78 (!) 124/101  Pulse: (!) 101 (!) 105 (!) 123 (!) 107  Resp: 19 16 (!) 24 20  Temp:      TempSrc:      SpO2: 100% 100% 100% 100%  Weight:      Height:       General: sedated, mechanically ventilated via ETT Head: no evidence of trauma, PERRL, EOMI, no exophtalmos or  lid lag, Neck: normal jugular venous pulsations and no hepatojugular reflux; brisk carotid pulses without delay and no carotid bruits Chest: clear to auscultation, no signs of consolidation by percussion or palpation, normal fremitus, symmetrical and full respiratory excursions Cardiovascular: normal position and quality of the apical impulse, irregular rhythm, normal first and second heart sounds, no rubs or gallops, no murmur Abdomen: no tenderness or distention, no masses by palpation, no abnormal pulsatility or arterial bruits, normal bowel sounds, no hepatosplenomegaly Extremities: no clubbing, cyanosis or edema; Neurological: grossly nonfocal, sedated.   LABS  CBC  Recent Labs  10/03/15 0500 10/04/15 0750  WBC 20.6* 17.7*  HGB 9.7* 9.2*  HCT 32.5* 31.6*  MCV 91.8 92.7  PLT 278 [id number]   Basic Metabolic Panel  Recent Labs  10/03/15 0500 10/03/15 1600 10/04/15 0750  NA 137 136 137  K 4.7 4.4 4.3  CL 103 103 102  CO2 29 29 30   GLUCOSE 94 99 108*  BUN 29* 27* 24*  CREATININE 1.19* 1.02* 1.07*  CALCIUM 8.7* 8.6* 8.7*  MG 2.4  --  2.4  PHOS  2.4* 2.2* 1.9*   Liver Function Tests  Recent Labs  10/03/15 0500 10/03/15 1600 10/04/15 0750  AST 83*  --   --   ALT 674*  --   --   ALKPHOS 173*  --   --   BILITOT 1.4*  --   --   PROT 5.7*  --   --   ALBUMIN 3.0*  2.9* 2.9* 2.7*    Radiology Studies Imaging results have been reviewed and Dg Chest Port 1 View  Result Date: 10/04/2015 CLINICAL DATA:  For Acute respiratory failure. Hx of Asthma, Afib, HTN and Pneumonia. EXAM: PORTABLE CHEST 1 VIEW COMPARISON:  10/03/2015 FINDINGS: Endotracheal tube, NG tube and central venous line unchanged. Stable cardiac silhouette. There is LEFT basilar atelectasis again noted. There is improvement in aeration of the lung bases. No pneumothorax. IMPRESSION: 1. Stable support apparatus. 2. Improved aeration lung bases. Electronically Signed   By: Suzy Bouchard M.D.   On: 10/04/2015  08:38   Dg Chest Port 1 View  Result Date: 10/03/2015 CLINICAL DATA:  Respiratory failure. Subsequent encounter. Intubated patient. EXAM: PORTABLE CHEST 1 VIEW COMPARISON:  10/02/2015 FINDINGS: Lung volumes are low. There is lung base opacity consistent with atelectasis. Probable small effusions. No evidence of pulmonary edema. No pneumothorax. Next item cardiac silhouette is normal in size. No mediastinal or hilar masses. Endotracheal tube, nasal/ orogastric tube, right internal jugular and left internal jugular central lines are all stable. IMPRESSION: 1. No evidence of residual congestive heart failure. No lung base opacity is similar to the prior exam 2. The most consistent with atelectasis.  Probable small effusions. 3. Support apparatus is stable and well positioned. Electronically Signed   By: Lajean Manes M.D.   On: 10/03/2015 07:54    TELE atrial fibrillation with mild RVR  ASSESSMENT AND PLAN  1. Sepsis: still requiring pressors, but dose is lower.   2.  Elevated troponin most likely secondary to demand ischemia in setting of sepsis from PNA and recent trauma from fall in setting of acute kidney injury. Normal LVEF 50-55%, but possible regional wall motion abnormalities and moderate MR, moderately reduced RVF and moderate pulmonary HTN with PASP 51mmHg. EF in July was 65%. Continue ASA for now. Statin stopped due to elevated LFTs. She did have evidence of coronary calcifications on chest CT last year.  Nuclear stress test once recovered from acute illness. 3. Pneumonia/ventilator-dependent respiratory failure: Antibiotics per CCM.   4. Persistent atrial fibrillation - junctional rhythm earlier due to sepsis, beta blockers and calcium channel blockers, has resolved. Xarelto was stopped on 9/10. Started Heparin IV yesterday. Decision on long term anticoagulant will depend on progress of renal and liver function. Stopped scheduled beta blocker due to hypotension. Avoiding IV Amio due to  elevated LFTs and recent shock liver, but these are improving steadily and this will likely be best option unless BP improves. 5.  Recent fall with ORIF of distal humeral fx.    Sanda Klein, MD, [facility] [phone] office [phone] pager 10/04/2015 9:31 AM

## 2015-10-04 NOTE — Progress Notes (Signed)
ANTICOAGULATION CONSULT NOTE - Follow Up Consult  Pharmacy Consult for Heparin Indication: atrial fibrillation  Patient Measurements: Height: 5\' 4"  (162.6 cm) Weight: 185 lb 3 oz (84 kg) IBW/kg (Calculated) : 54.7 Heparin Dosing Weight: 75 kg  Vital Signs: Temp: 97.6 F (36.4 C) (09/17 0400) Temp Source: Oral (09/17 0400) BP: 124/101 (09/17 0900) Pulse Rate: 107 (09/17 0900)  Labs:  Recent Labs  10/02/15 0400  10/03/15 0500 10/03/15 1600 10/04/15 0750  HGB 9.3*  --  9.7*  --  9.2*  HCT 30.5*  --  32.5*  --  31.6*  PLT 318  --  278  --  245  APTT 77*  --  77*  --  95*  LABPROT 21.3*  --  17.9*  --  16.5*  INR 1.82  --  1.46  --  1.32  HEPARINUNFRC >2.20*  --  1.40*  --  0.90*  CREATININE 1.30*  < > 1.19* 1.02* 1.07*  < > = values in this interval not displayed.  Estimated Creatinine Clearance: 46.9 mL/min (by C-G formula based on SCr of 1.07 mg/dL (H)).   Assessment: 50 yoF on Xarelto PTA for atrial fibrillation now changed to IV heparin in setting of acute shock liver and CRRT. AST/ALT trending down, INR trending down. Patient noted to be high bleed risk and dosing cautiously, will target lower range of aPTT goal.  APTT this am slightly above goal at 95, and heparin level remains elevated (possibly due to dose of Xarelto although the last dose was taken on 9/10).  Hgb remains low but stable, plt normal, CRRT still running, and no S/Sx bleeding noted.  Goal of Therapy:  APTT 66-90 Monitor platelets by anticoagulation protocol: Yes   Plan:  -Decrease heparin to 1150 units/hr -aPTT in 6 hours  -Daily aPTT, heparin level, CBC, and heparin level -Switch to monitoring heparin levels once they correlate with aPTT -Monitor signs and symptoms of bleeding  Antavious Spanos K. Velva Harman, PharmD, BCPS, CPP Clinical Pharmacist Pager: [phone] Phone: [phone] 10/04/2015 9:39 AM

## 2015-10-05 ENCOUNTER — Inpatient Hospital Stay (HOSPITAL_COMMUNITY): Payer: Medicare Other

## 2015-10-05 DIAGNOSIS — J9601 Acute respiratory failure with hypoxia: Secondary | ICD-10-CM

## 2015-10-05 LAB — PROTIME-INR
INR: 1.19
Prothrombin Time: 15.2 seconds (ref 11.4–15.2)

## 2015-10-05 LAB — RENAL FUNCTION PANEL
ALBUMIN: 3.2 g/dL — AB (ref 3.5–5.0)
ALBUMIN: 3.3 g/dL — AB (ref 3.5–5.0)
ANION GAP: 5 (ref 5–15)
Anion gap: 5 (ref 5–15)
BUN: 25 mg/dL — ABNORMAL HIGH (ref 6–20)
BUN: 21 mg/dL — AB (ref 6–20)
CALCIUM: 8.9 mg/dL (ref 8.9–10.3)
CHLORIDE: 102 mmol/L (ref 101–111)
CO2: 28 mmol/L (ref 22–32)
CO2: 29 mmol/L (ref 22–32)
CREATININE: 1.08 mg/dL — AB (ref 0.44–1.00)
Calcium: 8.8 mg/dL — ABNORMAL LOW (ref 8.9–10.3)
Chloride: 102 mmol/L (ref 101–111)
Creatinine, Ser: 1.03 mg/dL — ABNORMAL HIGH (ref 0.44–1.00)
GFR calc Af Amer: 60 mL/min — ABNORMAL LOW (ref >60)
GFR calc non Af Amer: 51 mL/min — ABNORMAL LOW (ref >60)
GFR, EST AFRICAN AMERICAN: 56 mL/min — AB (ref >60)
GFR, EST NON AFRICAN AMERICAN: 49 mL/min — AB (ref >60)
GLUCOSE: 135 mg/dL — AB (ref 65–99)
Glucose, Bld: 105 mg/dL — ABNORMAL HIGH (ref 65–99)
PHOSPHORUS: 2.4 mg/dL — AB (ref 2.5–4.6)
PHOSPHORUS: 1.6 mg/dL — AB (ref 2.5–4.6)
POTASSIUM: 3.8 mmol/L (ref 3.5–5.1)
Potassium: 4.3 mmol/L (ref 3.5–5.1)
SODIUM: 135 mmol/L (ref 135–145)
Sodium: 136 mmol/L (ref 135–145)

## 2015-10-05 LAB — GLUCOSE, CAPILLARY
GLUCOSE-CAPILLARY: 112 mg/dL — AB (ref 65–99)
GLUCOSE-CAPILLARY: 113 mg/dL — AB (ref 65–99)
GLUCOSE-CAPILLARY: 113 mg/dL — AB (ref 65–99)
GLUCOSE-CAPILLARY: 134 mg/dL — AB (ref 65–99)
GLUCOSE-CAPILLARY: 134 mg/dL — AB (ref 65–99)
GLUCOSE-CAPILLARY: 38 mg/dL — AB (ref 65–99)
Glucose-Capillary: 47 mg/dL — ABNORMAL LOW (ref 65–99)
Glucose-Capillary: 407 mg/dL — ABNORMAL HIGH (ref 65–99)
Glucose-Capillary: 113 mg/dL — ABNORMAL HIGH (ref 65–99)

## 2015-10-05 LAB — CBC
HEMATOCRIT: 30.9 % — AB (ref 36.0–46.0)
Hemoglobin: 9.1 g/dL — ABNORMAL LOW (ref 12.0–15.0)
MCH: 27.4 pg (ref 26.0–34.0)
MCHC: 29.4 g/dL — AB (ref 30.0–36.0)
MCV: 93.1 fL (ref 78.0–100.0)
PLATELETS: 197 10*3/uL (ref 150–400)
RBC: 3.32 MIL/uL — ABNORMAL LOW (ref 3.87–5.11)
RDW: 16.4 % — AB (ref 11.5–15.5)
WBC: 17.7 10*3/uL — ABNORMAL HIGH (ref 4.0–10.5)

## 2015-10-05 LAB — MAGNESIUM: Magnesium: 2.4 mg/dL (ref 1.7–2.4)

## 2015-10-05 LAB — HEPARIN LEVEL (UNFRACTIONATED): HEPARIN UNFRACTIONATED: 0.51 [IU]/mL (ref 0.30–0.70)

## 2015-10-05 LAB — APTT: APTT: 72 s — AB (ref 24–36)

## 2015-10-05 MED ORDER — FENTANYL CITRATE (PF) 100 MCG/2ML IJ SOLN
50.0000 ug | INTRAMUSCULAR | Status: DC | PRN
  Administered 2015-10-06 – 2015-10-07 (×3): 50 ug via INTRAVENOUS
  Filled 2015-10-05 (×3): qty 2

## 2015-10-05 MED ORDER — FENTANYL CITRATE (PF) 100 MCG/2ML IJ SOLN: 50.0000 ug | INTRAMUSCULAR | Status: DC | PRN

## 2015-10-05 MED ORDER — CITALOPRAM HYDROBROMIDE 10 MG/5ML PO SOLN
20.0000 mg | Freq: Every day | ORAL | Status: DC
  Administered 2015-10-05 – 2015-10-13 (×9): 20 mg
  Filled 2015-10-05 (×11): qty 10

## 2015-10-05 MED ORDER — ALBUMIN HUMAN 25 % IV SOLN
12.5000 g | Freq: Once | INTRAVENOUS | Status: AC
  Administered 2015-10-05: 12.5 g via INTRAVENOUS
  Filled 2015-10-05: qty 50

## 2015-10-05 MED ORDER — ALBUMIN HUMAN 25 % IV SOLN
25.0000 g | Freq: Four times a day (QID) | INTRAVENOUS | Status: AC
  Administered 2015-10-05 (×3): 25 g via INTRAVENOUS
  Filled 2015-10-05: qty 50
  Filled 2015-10-05: qty 100
  Filled 2015-10-05 (×4): qty 50

## 2015-10-05 MED ORDER — DEXMEDETOMIDINE HCL IN NACL 200 MCG/50ML IV SOLN
0.0000 ug/kg/h | INTRAVENOUS | Status: DC
  Administered 2015-10-05: 0.4 ug/kg/h via INTRAVENOUS
  Administered 2015-10-05: 0.398 ug/kg/h via INTRAVENOUS
  Administered 2015-10-06: 0.4 ug/kg/h via INTRAVENOUS
  Administered 2015-10-06 (×2): 0.2 ug/kg/h via INTRAVENOUS
  Administered 2015-10-07: 0.3 ug/kg/h via INTRAVENOUS
  Administered 2015-10-07 (×2): 0.4 ug/kg/h via INTRAVENOUS
  Administered 2015-10-08: 0.2 ug/kg/h via INTRAVENOUS
  Administered 2015-10-08: 0.302 ug/kg/h via INTRAVENOUS
  Filled 2015-10-05 (×12): qty 50

## 2015-10-05 MED ORDER — DEXTROSE 50 % IV SOLN
INTRAVENOUS | Status: AC
Start: 2015-10-05 — End: 2015-10-05
  Administered 2015-10-05: 50 mL
  Filled 2015-10-05: qty 50

## 2015-10-05 MED ORDER — ALBUMIN HUMAN 25 % IV SOLN: 25.0000 g | Freq: Four times a day (QID) | INTRAVENOUS | Status: DC

## 2015-10-05 NOTE — Progress Notes (Signed)
Patient Name: Cristina [name] Date of Encounter: 10/05/2015  Active Problems:   S/P ORIF (open reduction internal fixation) fracture   Hypotension due to drugs   Bradycardia   Acute respiratory failure with hypoxia (HCC)   Elevated troponin   Orthostatic hypotension   Encounter for central line placement   Persistent atrial fibrillation ([place])   Pressure ulcer   Length of Stay: 9  SUBJECTIVE  Intubated / sedated   . albumin human  25 g Intravenous Q6H  . aspirin  81 mg Oral Daily  . budesonide (PULMICORT) nebulizer solution  0.5 mg Nebulization BID  . chlorhexidine  15 mL Mouth Rinse BID  . citalopram  20 mg Oral QHS  . feeding supplement (PRO-STAT SUGAR FREE 64)  60 mL Per Tube BID  . insulin aspart  0-20 Units Subcutaneous Q4H  . ipratropium-albuterol  3 mL Nebulization Q6H  . mouth rinse  15 mL Mouth Rinse 10 times per day  . pantoprazole sodium  40 mg Per Tube Daily  . pramipexole  2 mg Oral QHS  . sodium chloride  250 mL Intravenous Once  . Vitamin D (Ergocalciferol)  50,000 Units Oral Q7 days     Intake/Output Summary (Last 24 hours) at 10/05/15 1028 Last data filed at 10/05/15 1000  Gross per 24 hour  Intake          1728.06 ml  Output             2781 ml  Net         -1052.94 ml   Filed Weights   10/03/15 0407 10/04/15 0400 10/05/15 0500  Weight: 83.5 kg (184 lb 1.4 oz) 84 kg (185 lb 3 oz) 83.4 kg (183 lb 13.8 oz)    PHYSICAL EXAM Vitals:   10/05/15 0814 10/05/15 0818 10/05/15 0900 10/05/15 1000  BP: 118/63  (!) 108/55 (!) 97/51  Pulse: (!) 111  (!) 106 98  Resp: 16  17 16   Temp:      TempSrc:      SpO2: 100% 100% 100% 100%  Weight:      Height:       General: sedated, mechanically ventilated via ETT Head: no evidence of trauma, PERRL, EOMI, no exophtalmos or lid lag, Neck: normal jugular venous pulsations and no hepatojugular reflux; brisk carotid pulses without delay and no carotid bruits Chest: clear to auscultation, no signs of  consolidation by percussion or palpation, normal fremitus, symmetrical and full respiratory excursions Cardiovascular: normal position and quality of the apical impulse, irregular rhythm, normal first and second heart sounds, no rubs or gallops, no murmur Abdomen: no tenderness or distention, no masses by palpation, no abnormal pulsatility or arterial bruits, normal bowel sounds, no hepatosplenomegaly Extremities: no clubbing, cyanosis or edema; Neurological: grossly nonfocal, sedated.   LABS  CBC  Recent Labs  10/04/15 0750 10/05/15 0515  WBC 17.7* 17.7*  HGB 9.2* 9.1*  HCT 31.6* 30.9*  MCV 92.7 93.1  PLT 245 [id number]   Basic Metabolic Panel  Recent Labs  10/04/15 0750 10/04/15 1600 10/05/15 0515  NA 137 135 135  K 4.3 4.1 4.3  CL 102 103 102  CO2 30 28 28   GLUCOSE 108* 115* 105*  BUN 24* 24* 25*  CREATININE 1.07* 1.02* 1.08*  CALCIUM 8.7* 8.4* 8.9  MG 2.4  --  2.4  PHOS 1.9* 1.9* 2.4*   Liver Function Tests  Recent Labs  10/03/15 0500  10/04/15 1600 10/05/15 0515  AST 83*  --   --   --  ALT 674*  --   --   --   ALKPHOS 173*  --   --   --   BILITOT 1.4*  --   --   --   PROT 5.7*  --   --   --   ALBUMIN 3.0*  2.9*  < > 2.7* 3.2*  < > = values in this interval not displayed.  Radiology Studies Imaging results have been reviewed and Dg Chest Port 1 View  Result Date: 10/05/2015 CLINICAL DATA:  Acute respiratory failure EXAM: PORTABLE CHEST 1 VIEW COMPARISON:  10/04/2015 FINDINGS: Cardiac shadow remains mildly enlarged. A nasogastric catheter is seen coiled within the stomach. Endotracheal tube and bilateral jugular central lines are again identified and stable. Lungs are well aerated bilaterally. Minimal bibasilar atelectasis remains. No new focal infiltrate is noted. IMPRESSION: Minimal bibasilar atelectasis. Tubes and lines as described. Electronically Signed   By: Inez Catalina M.D.   On: 10/05/2015 07:46   Dg Chest Port 1 View  Result Date:  10/04/2015 CLINICAL DATA:  For Acute respiratory failure. Hx of Asthma, Afib, HTN and Pneumonia. EXAM: PORTABLE CHEST 1 VIEW COMPARISON:  10/03/2015 FINDINGS: Endotracheal tube, NG tube and central venous line unchanged. Stable cardiac silhouette. There is LEFT basilar atelectasis again noted. There is improvement in aeration of the lung bases. No pneumothorax. IMPRESSION: 1. Stable support apparatus. 2. Improved aeration lung bases. Electronically Signed   By: Suzy Bouchard M.D.   On: 10/04/2015 08:38    TELE atrial fibrillation with mild RVR  ASSESSMENT AND PLAN  1. Sepsis: off pressors plan per CCM 2.  Elevated troponin most likely secondary to demand ischemia in setting of sepsis from PNA and recent trauma from fall in setting of acute kidney injury. Normal LVEF 50-55%, but possible regional wall motion abnormalities and moderate MR, moderately reduced RVF and moderate pulmonary HTN with PASP 61mmHg. EF in July was 65%. Continue ASA for now. Statin stopped due to elevated LFTs. She did have evidence of coronary calcifications on chest CT last year.  Nuclear stress test once recovered from acute illness. 3. Pneumonia/ventilator-dependent respiratory failure: Antibiotics per CCM.   4. Persistent atrial fibrillation - junctional rhythm earlier due to sepsis, beta blockers and calcium channel blockers, has resolved. Xarelto was stopped on 9/10. Started Heparin IV . Decision on long term anticoagulant will depend on progress of renal and liver function. Stopped scheduled beta blocker due to hypotension. Avoiding IV Amio due to elevated LFTs and recent shock liver, but these are improving steadily and this will likely be best option unless BP improves.  LFTls pending from this am.  Rate control fine unless she gets agitated 5.  Recent fall with ORIF of distal humeral fx. 6. Anuric renal failure:  CRRT discussed with Dr Lorrene Reid  Tea colored urine still with very  Little output BUN/Cr normalized on  CRRT    Jenkins Rouge

## 2015-10-05 NOTE — Progress Notes (Signed)
ANTICOAGULATION CONSULT NOTE - Follow Up Consult  Pharmacy Consult for Heparin Indication: atrial fibrillation  Patient Measurements: Height: 5\' 4"  (162.6 cm) Weight: 183 lb 13.8 oz (83.4 kg) IBW/kg (Calculated) : 54.7 Heparin Dosing Weight: 75 kg  Vital Signs: Temp: 98.4 F (36.9 C) (09/18 0800) Temp Source: Oral (09/18 0800) BP: 97/51 (09/18 1000) Pulse Rate: 98 (09/18 1000)  Labs:  Recent Labs  10/03/15 0500  10/04/15 0750 10/04/15 1600 10/04/15 1616 10/05/15 0515  HGB 9.7*  --  9.2*  --   --  9.1*  HCT 32.5*  --  31.6*  --   --  30.9*  PLT 278  --  245  --   --  197  APTT 77*  --  95*  --  94* 72*  LABPROT 17.9*  --  16.5*  --   --  15.2  INR 1.46  --  1.32  --   --  1.19  HEPARINUNFRC 1.40*  --  0.90*  --   --  0.51  CREATININE 1.19*  < > 1.07* 1.02*  --  1.08*  < > = values in this interval not displayed.  Estimated Creatinine Clearance: 46.3 mL/min (by C-G formula based on SCr of 1.08 mg/dL (H)).  Medications: Heparin @ 1150 units/hr  Assessment: 62 yof on Xarelto PTA for atrial fibrillation (last dose 9/10) now changed to IV heparin in setting of acute shock liver and CRRT. AST/ALT trending down, INR trending down. Patient noted to be high bleed risk and dosing cautiously, will target lower range of aPTT goal.  Both heparin level and aPTT are therapeutic and correlating today at 0.51 and 72 seconds respectively. CBC stable. No bleeding.  Goal of Therapy:  Heparin level 0.3-0.7 APTT 66-90 seconds Monitor platelets by anticoagulation protocol: Yes   Plan:  1) Continue heparin at 1150 units/hr 2) Check heparin level and aPTT for one more day to make sure still correlating  Nena Jordan, PharmD, BCPS 10/05/2015 10:41 AM

## 2015-10-05 NOTE — Progress Notes (Signed)
158ml's of fentanyl wasted per protocol with second RN to verify. Will continue to monitor.

## 2015-10-05 NOTE — Progress Notes (Signed)
Pt noted to have increased HR of 110-120's a.fibb. Fluid removal reduced to even and MD called. New orders received for 12.5g of IV albumin. RN will continue to monitor.

## 2015-10-05 NOTE — Progress Notes (Signed)
PULMONARY / CRITICAL CARE MEDICINE   Name: Cristina [name] MRN: [id number] DOB: 1940/08/27    ADMISSION DATE:  09/24/2015 CONSULTATION DATE:  09/27/15  REFERRING MD: Dr. Erlinda Hong  CHIEF COMPLAINT:  Fall   75 yo female had fall and developed Rt distal humerus fx.  Had repair on 9/07.  Developed bradycardia, hypotension, progressive renal failure >> likely from PNA, and medications (beta blocker/calcium channel blocker).  PMHx of A fib on xarelto, HTN, HLD, Gout, Asthma, Anxiety.   SUBJECTIVE:  Sedated on vent   VITAL SIGNS: BP (!) 97/51   Pulse 98   Temp 98.4 F (36.9 C) (Oral)   Resp 16   Ht 5\' 4"  (1.626 m)   Wt 183 lb 13.8 oz (83.4 kg)   SpO2 100%   BMI 31.56 kg/m   HEMODYNAMICS: CVP:  [7 mmHg-14 mmHg] 8 mmHg  VENTILATOR SETTINGS: Vent Mode: PRVC FiO2 (%):  [30 %] 30 % Set Rate:  [16 bmp] 16 bmp Vt Set:  [500 mL] 500 mL PEEP:  [5 cmH20] 5 cmH20 Plateau Pressure:  [11 cmH20-18 cmH20] 17 cmH20  INTAKE / OUTPUT:  Intake/Output Summary (Last 24 hours) at 10/05/15 1048 Last data filed at 10/05/15 1000  Gross per 24 hour  Intake          1782.06 ml  Output             2907 ml  Net         -1124.94 ml    PHYSICAL EXAMINATION: General: chronically ill appearing, elderly, sedated on fent gtt, agitated during wake-up assessment Neuro: follows simple commands when off sedation HEENT: ETT in place, no JVD Cardiac: irregular HR goes up w/ stimulation  Chest: basilar crackles, no accessory use  Abd: soft, non tender [id number] anasarca, rt arm sling bandage Skin: no rashes  LABS:  BMET  Recent Labs Lab 10/04/15 0750 10/04/15 1600 10/05/15 0515  NA 137 135 135  K 4.3 4.1 4.3  CL 102 103 102  CO2 30 28 28   BUN 24* 24* 25*  CREATININE 1.07* 1.02* 1.08*  GLUCOSE 108* 115* 105*   Electrolytes  Recent Labs Lab 10/03/15 0500  10/04/15 0750 10/04/15 1600 10/05/15 0515  CALCIUM 8.7*  < > 8.7* 8.4* 8.9  MG 2.4  --  2.4  --  2.4  PHOS 2.4*  < > 1.9* 1.9*  2.4*  < > = values in this interval not displayed. CBC  Recent Labs Lab 10/03/15 0500 10/04/15 0750 10/05/15 0515  WBC 20.6* 17.7* 17.7*  HGB 9.7* 9.2* 9.1*  HCT 32.5* 31.6* 30.9*  PLT 278 245 197   Coag's  Recent Labs Lab 10/03/15 0500 10/04/15 0750 10/04/15 1616 10/05/15 0515  APTT 77* 95* 94* 72*  INR 1.46 1.32  --  1.19    Sepsis Markers  Recent Labs Lab 09/29/15 0415 09/29/15 0500  PROCALCITON 5.96 7.43    ABG  Recent Labs Lab 09/30/15 0431 09/30/15 0851 10/01/15 0337  PHART 7.216* 7.154* 7.427  PCO2ART 56.2* 70.5* 39.1  PO2ART 124* 132.0* 150.0*   Liver Enzymes  Recent Labs Lab 09/30/15 0400  10/01/15 0500  10/03/15 0500  10/04/15 0750 10/04/15 1600 10/05/15 0515  AST 1,226*  --  376*  --  83*  --   --   --   --   ALT 2,091*  --  1,313*  --  674*  --   --   --   --   ALKPHOS 182*  --  181*  --  173*  --   --   --   --   BILITOT 1.7*  --  1.9*  --  1.4*  --   --   --   --   ALBUMIN 3.2*  3.2*  < > 2.8*  2.8*  < > 3.0*  2.9*  < > 2.7* 2.7* 3.2*  < > = values in this interval not displayed. Cardiac Enzymes No results for input(s): TROPONINI, PROBNP in the last 168 hours. Glucose  Recent Labs Lab 10/04/15 1219 10/04/15 1555 10/05/15 0009 10/05/15 0406 10/05/15 0816 10/05/15 0901  GLUCAP 103* 107* 113* 113* 47* 407*   Imaging Dg Chest Port 1 View  Result Date: 10/05/2015 CLINICAL DATA:  Acute respiratory failure EXAM: PORTABLE CHEST 1 VIEW COMPARISON:  10/04/2015 FINDINGS: Cardiac shadow remains mildly enlarged. A nasogastric catheter is seen coiled within the stomach. Endotracheal tube and bilateral jugular central lines are again identified and stable. Lungs are well aerated bilaterally. Minimal bibasilar atelectasis remains. No new focal infiltrate is noted. IMPRESSION: Minimal bibasilar atelectasis. Tubes and lines as described. Electronically Signed   By: Inez Catalina M.D.   On: 10/05/2015 07:46  R>L basilar atx  STUDIES:  2D  echo 9/10 >> EF [id number], mod MR, systolic function moderately reduced, PA peak pressure 60mm  CULTURES: Blood 9/10 >ng Sputum 9/10 > normal flora  MRSA 9/10 >neg  ANTIBIOTICS: Levaquin 9/10 > Vanc 9/10 > 9/13  SIGNIFICANT EVENTS: 9/07 admit after fall. Had repair of right shoulder surgery. 9/10 PCCM consulted for hypotension, bradycardia. Intubated for airway.  LINES/TUBES: L IJ CVL 9/10>>> ETT 9/11>>> R IJ HD catheter 9/12>>>  DISCUSSION: Weaning held up by AF-RVR & hypotension  Remains on low dose pressors and fent gtt d/t agitation. I suspect that some of her pressor dependence and failure to wean are primarily a fentanyl issue as her CXR does not look that bad. For today plan will be: change to precedex gtt, dc fent gtt, push weaning as MS will allow. Would be ok to wean on low dose levophed.   ASSESSMENT / PLAN:  PULMONARY A: Acute hypoxic respiratory failure 2nd to HCAP and altered mental status. Hx of asthma. P:   Cont full vent support w/ daily weaning assessment Will see how she looks off fent gtt  PAD protocol  Cont BDs/ICS PRN CXR   CARDIOVASCULAR A:  Shock - combination of septic from PNA and cardiogenic from beta blocker/calcium channel blocker induced bradycardia-->wonder to what degree sedation contributing Chronic A fib. Elevated troponin 2nd to demand ischemia. Moderate mitral regurgitation. Hx of HTN, HLD. P:  Heparin gtt per pharmacy, xarelto stopped 9/10 Hold outpt lipitor Hold lopressor while on levophed, trying to avoid amio here Nuclear stress test once recovered  RENAL A:   AKI. P:   CRRT per renal -keep even balance   GASTROINTESTINAL A:   Shock liver - improving slowly.  Nutrition. P:   Tube feeds Protonix for SUP  HEMATOLOGIC A:   Anemia of critical illness. Coagulopathy - r/t shock liver >> improving. P:  F/u CBC, INR Transfuse per protocol  INFECTIOUS A:   Sepsis 2nd to HCAP. P:   Day 7/7 levaquin  completed  ENDOCRINE A:   Relative adrenal insufficiency. P:   SSI while on tube feeds  NEUROLOGIC A:   Acute metabolic encephalopathy. Hx of anxiety, RLS. P:   RASS goal: 0- -1 Change to precedex gtt and PRN fent Add back celexa Hs mirapex Hold outpt xanax  FAMILY  -  Updates: Daughters updated at bedside 9/16 - Inter-disciplinary family meet or Palliative Care meeting due by:  9/16 (completed)   Erick Colace ACNP-BC [place] Pager # [phone] OR # [phone] if no answer  10/05/2015  Remains on pressors and CRRT.  Agitated with WUA.  RASS -1.  HR irregular.  No wheeze.  Abd soft.  1+ edema.  Hb 9.1, Creatinine 1.08, WBC 17.7  CXR - basilar ATX  Assessment/plan:  Acute respiratory failure. - wean vent  Shock, MR, A fib. - wean levophed - heparin gtt  AKI. - CRRT per nephrology  HCAP. - completed Abx  Rt humerus fx. - per ortho  Goals of care. - not making progress as expected - will need to discuss further with family  CC time by me independent of APP time 33 minutes.  [name], MD [facility] Pulmonary/Critical Care 10/05/2015, 12:02 PM Pager:  [phone] After 3pm call: [phone]

## 2015-10-05 NOTE — Progress Notes (Signed)
Patient ID: Cristina [name], female   DOB: [birth year], 75 y.o.   MRN: [id number]  [facility] Progress Note   Subjective:    Remains on low dose pressors With turning this AM HR up to 150's (same thing last night) Sedation cut by 50% - severely uncomfortable - sedation back up now  Current CRRT neg 50/hr, all 4K fluids (500/300/1500), effluent dose adequate at 24 No catheter issues or filter issues Heparin with CRRT    Objective:   BP (!) 108/55   Pulse (!) 106   Temp 98.4 F (36.9 C) (Oral)   Resp 17   Ht 5\' 4"  (1.626 m)   Wt 83.4 kg (183 lb 13.8 oz)   SpO2 100%   BMI 31.56 kg/m   Intake/Output Summary (Last 24 hours) at 10/05/15 0941 Last data filed at 10/05/15 0900  Gross per 24 hour  Intake          1798.06 ml  Output             2882 ml  Net         -1083.94 ml   Weight change: -0.6 kg (-1 lb 5.2 oz)  Physical Exam: CVP 8 Intubated, sedated but grimaces with examination of abd and LE's Lines R IJ HD cath (9/13), left IJ TLC, ETT Pulse irregular tachycardia, S1 and S2 normal Clear anteriorly Soft, obese, not distended, grimaces with exam 1+ edema both LE's and UE's No urine  Imaging: Dg Chest Port 1 View  Result Date: 10/05/2015 CLINICAL DATA:  Acute respiratory failure EXAM: PORTABLE CHEST 1 VIEW COMPARISON:  10/04/2015 FINDINGS: Cardiac shadow remains mildly enlarged. A nasogastric catheter is seen coiled within the stomach. Endotracheal tube and bilateral jugular central lines are again identified and stable. Lungs are well aerated bilaterally. Minimal bibasilar atelectasis remains. No new focal infiltrate is noted. IMPRESSION: Minimal bibasilar atelectasis. Tubes and lines as described. Electronically Signed   By: Inez Catalina M.D.   On: 10/05/2015 07:46   Dg Chest Port 1 View  Result Date: 10/04/2015 CLINICAL DATA:  For Acute respiratory failure. Hx of Asthma, Afib, HTN and Pneumonia. EXAM: PORTABLE CHEST 1 VIEW COMPARISON:  10/03/2015  FINDINGS: Endotracheal tube, NG tube and central venous line unchanged. Stable cardiac silhouette. There is LEFT basilar atelectasis again noted. There is improvement in aeration of the lung bases. No pneumothorax. IMPRESSION: 1. Stable support apparatus. 2. Improved aeration lung bases. Electronically Signed   By: Suzy Bouchard M.D.   On: 10/04/2015 08:38   Labs:  Recent Labs Lab 10/02/15 0400 10/02/15 1951 10/03/15 0500 10/03/15 1600 10/04/15 0750 10/04/15 1600 10/05/15 0515  NA 137 135 137 136 137 135 135  K 4.4 5.3* 4.7 4.4 4.3 4.1 4.3  CL 102 101 103 103 102 103 102  CO2 27 29 29 29 30 28 28   GLUCOSE 108* 119* 94 99 108* 115* 105*  BUN 29* 30* 29* 27* 24* 24* 25*  CREATININE 1.30* 1.21* 1.19* 1.02* 1.07* 1.02* 1.08*  CALCIUM 8.8* 8.9 8.7* 8.6* 8.7* 8.4* 8.9  PHOS 3.0 3.1 2.4* 2.2* 1.9* 1.9* 2.4*     Recent Labs Lab 10/02/15 0400 10/03/15 0500 10/04/15 0750 10/05/15 0515  WBC 18.1* 20.6* 17.7* 17.7*  HGB 9.3* 9.7* 9.2* 9.1*  HCT 30.5* 32.5* 31.6* 30.9*  MCV 88.2 91.8 92.7 93.1  PLT 318 278 245 197    Medications:    . aspirin  81 mg Oral Daily  . budesonide (PULMICORT) nebulizer solution  0.5  mg Nebulization BID  . chlorhexidine  15 mL Mouth Rinse BID  . citalopram  20 mg Oral QHS  . feeding supplement (PRO-STAT SUGAR FREE 64)  60 mL Per Tube BID  . insulin aspart  0-20 Units Subcutaneous Q4H  . ipratropium-albuterol  3 mL Nebulization Q6H  . mouth rinse  15 mL Mouth Rinse 10 times per day  . pantoprazole sodium  40 mg Per Tube Daily  . pramipexole  2 mg Oral QHS  . sodium chloride  250 mL Intravenous Once  . Vitamin D (Ergocalciferol)  50,000 Units Oral Q7 days   Infusions . feeding supplement (VITAL HIGH PROTEIN) 1,000 mL (10/04/15 1800)  . fentaNYL infusion INTRAVENOUS 750 mcg/hr (10/05/15 0800)  . heparin 1,150 Units/hr (10/04/15 2216)  . norepinephrine (LEVOPHED) Adult infusion 2 mcg/min (10/04/15 2214)  . dialysis replacement fluid (prismasate)  500 mL/hr at 10/05/15 0122  . dialysis replacement fluid (prismasate) 300 mL/hr at 10/05/15 0122  . dialysate (PRISMASATE) 1,500 mL/hr at 10/05/15 0454   Background: 75 yo female with AF, HTN, HLD, gut, asthma - s/p fall with R distal humerus fx, repaired on 09/24/15. Developed bradycardia, hypotension, progressive renal failure >>likely from PNA, and medications (beta blocker/calcium channel blocker). Resultant hypoxemia respiratory failure, shock liver. Baseline creatinine around 0.7. CRRT  initiated 09/29/15 for anuric renal failure, volume overload.(catheter placed 9/12 by CCM)  Assessment/ Plan:    Acute kidney injury: Ischemic ATN secondary to profound/sustained hypotension.  Remains anuric  Continue current CRRT  K and phos fine 0-50 neg balance as tolerated (CVP 8 but peripheral edema ->albumin) On low-dose pressors so unable to transition to intermittent hemodialysis just yet.   Hypoxic respiratory failure/sepsis/pneumonia: Finished levaquin  Status post fracture of the humerus and open reduction and internal fixation.  Appears to be doing well from a surgical standpoint  Elevated LFTs/coagulopathy: From shock liver No LFT's today Last INR 1.1  Anemia: Anemia of critical illness/blood loss following recent fracture and surgery-low iron stores but elevated ferritin prohibits intravenous iron therapy at this time.   Jamal Maes, MD [facility] [phone] Pager 10/05/2015, 9:42 AM

## 2015-10-06 ENCOUNTER — Inpatient Hospital Stay (HOSPITAL_COMMUNITY): Payer: Medicare Other

## 2015-10-06 LAB — RENAL FUNCTION PANEL
ALBUMIN: 4 g/dL (ref 3.5–5.0)
ANION GAP: 6 (ref 5–15)
ANION GAP: 8 (ref 5–15)
Albumin: 3.6 g/dL (ref 3.5–5.0)
BUN: 20 mg/dL (ref 6–20)
BUN: 18 mg/dL (ref 6–20)
CALCIUM: 9.1 mg/dL (ref 8.9–10.3)
CHLORIDE: 102 mmol/L (ref 101–111)
CO2: 29 mmol/L (ref 22–32)
CO2: 29 mmol/L (ref 22–32)
CREATININE: 0.74 mg/dL (ref 0.44–1.00)
Calcium: 9.3 mg/dL (ref 8.9–10.3)
Chloride: 101 mmol/L (ref 101–111)
Creatinine, Ser: 0.94 mg/dL (ref 0.44–1.00)
GFR calc Af Amer: >60 mL/min (ref >60)
GFR calc non Af Amer: 57 mL/min — ABNORMAL LOW (ref >60)
GLUCOSE: 119 mg/dL — AB (ref 65–99)
Glucose, Bld: 129 mg/dL — ABNORMAL HIGH (ref 65–99)
PHOSPHORUS: 2.1 mg/dL — AB (ref 2.5–4.6)
PHOSPHORUS: 1.3 mg/dL — AB (ref 2.5–4.6)
POTASSIUM: 4.2 mmol/L (ref 3.5–5.1)
Potassium: 4.2 mmol/L (ref 3.5–5.1)
SODIUM: 138 mmol/L (ref 135–145)
Sodium: 137 mmol/L (ref 135–145)

## 2015-10-06 LAB — CBC
HCT: 28.5 % — ABNORMAL LOW (ref 36.0–46.0)
HEMOGLOBIN: 8.5 g/dL — AB (ref 12.0–15.0)
MCH: 27.4 pg (ref 26.0–34.0)
MCHC: 29.8 g/dL — ABNORMAL LOW (ref 30.0–36.0)
MCV: 91.9 fL (ref 78.0–100.0)
PLATELETS: 131 10*3/uL — AB (ref 150–400)
RBC: 3.1 MIL/uL — AB (ref 3.87–5.11)
RDW: 16.5 % — ABNORMAL HIGH (ref 11.5–15.5)
WBC: 15.4 10*3/uL — AB (ref 4.0–10.5)

## 2015-10-06 LAB — HEPATIC FUNCTION PANEL
ALK PHOS: 109 U/L (ref 38–126)
ALT: 181 U/L — AB (ref 14–54)
AST: 25 U/L (ref 15–41)
Albumin: 4 g/dL (ref 3.5–5.0)
Bilirubin, Direct: 0.8 mg/dL — ABNORMAL HIGH (ref 0.1–0.5)
Indirect Bilirubin: 1.2 mg/dL — ABNORMAL HIGH (ref 0.3–0.9)
TOTAL PROTEIN: 6.5 g/dL (ref 6.5–8.1)
Total Bilirubin: 2 mg/dL — ABNORMAL HIGH (ref 0.3–1.2)

## 2015-10-06 LAB — PROTIME-INR
INR: 1.19
Prothrombin Time: 15.2 seconds (ref 11.4–15.2)

## 2015-10-06 LAB — GLUCOSE, CAPILLARY
GLUCOSE-CAPILLARY: 119 mg/dL — AB (ref 65–99)
GLUCOSE-CAPILLARY: 84 mg/dL (ref 65–99)
GLUCOSE-CAPILLARY: 113 mg/dL — AB (ref 65–99)
Glucose-Capillary: 153 mg/dL — ABNORMAL HIGH (ref 65–99)
Glucose-Capillary: 110 mg/dL — ABNORMAL HIGH (ref 65–99)
Glucose-Capillary: 120 mg/dL — ABNORMAL HIGH (ref 65–99)

## 2015-10-06 LAB — MAGNESIUM: Magnesium: 2.4 mg/dL (ref 1.7–2.4)

## 2015-10-06 LAB — HEPARIN LEVEL (UNFRACTIONATED): Heparin Unfractionated: 0.32 IU/mL (ref 0.30–0.70)

## 2015-10-06 LAB — APTT: APTT: 65 s — AB (ref 24–36)

## 2015-10-06 MED ORDER — MIDAZOLAM HCL 2 MG/2ML IJ SOLN: 4.0000 mg | Freq: Once | INTRAMUSCULAR | Status: DC

## 2015-10-06 MED ORDER — PROPOFOL 500 MG/50ML IV EMUL
5.0000 ug/kg/min | Freq: Once | INTRAVENOUS | Status: AC
  Administered 2015-10-07: 25 ug/kg/min via INTRAVENOUS
  Filled 2015-10-06: qty 50

## 2015-10-06 MED ORDER — FENTANYL CITRATE (PF) 100 MCG/2ML IJ SOLN: 200.0000 ug | Freq: Once | INTRAMUSCULAR | Status: DC

## 2015-10-06 MED ORDER — IPRATROPIUM-ALBUTEROL 0.5-2.5 (3) MG/3ML IN SOLN: 3.0000 mL | RESPIRATORY_TRACT | Status: DC | PRN

## 2015-10-06 MED ORDER — VECURONIUM BROMIDE 10 MG IV SOLR
10.0000 mg | Freq: Once | INTRAVENOUS | Status: AC
  Administered 2015-10-07: 8 mg via INTRAVENOUS

## 2015-10-06 MED ORDER — ARFORMOTEROL TARTRATE 15 MCG/2ML IN NEBU
15.0000 ug | INHALATION_SOLUTION | Freq: Two times a day (BID) | RESPIRATORY_TRACT | Status: DC
  Administered 2015-10-06 – 2015-10-15 (×18): 15 ug via RESPIRATORY_TRACT
  Filled 2015-10-06 (×19): qty 2

## 2015-10-06 MED ORDER — ETOMIDATE 2 MG/ML IV SOLN
40.0000 mg | Freq: Once | INTRAVENOUS | Status: AC
  Administered 2015-10-07: 20 mg via INTRAVENOUS

## 2015-10-06 NOTE — Plan of Care (Signed)
Interdisciplinary Goals of Care Family Meeting    Date carried out:: 10/06/2015  Location of the meeting: Conference room  Member's involved: Nurse Practitioner and Family Member or next of kin  Durable Power of Attorney or acting medical decision maker:  Daughters Denis Southern and Rockwell Coble     Discussion: We discussed goals of care for [employer] .  Specifically her current medical problem list, potential treatment options and potential barriers to improvement. Specifically failure to wean, AKI and option for trach and risk of not improving from her kidney injury. They remain hopeful that [place] will recover, but also understand that there are a lot of unknowns. At this point the goal is to continue aggressive medical support. They are hopeful that placement of trach will allow [place] to get her off sedation, and then off vent. At that point focus can turn to rehab and time to see if her Kidney injury will improve. They do not want her to suffer. They do not want her to live in a SNF for HD and trach care. They understand that there could be a scenario in which she is not strong enough to decannulate and that her kidney fxn does not return. If we were to reach that point they would favor transition to comfort.   Code status: Limited Code or DNR with short term  Disposition: Continue current acute care  Time spent for the meeting: 30 minutes   Clementeen Graham 10/06/2015 2:52 PM

## 2015-10-06 NOTE — Progress Notes (Signed)
ANTICOAGULATION CONSULT NOTE - Follow Up Consult  Pharmacy Consult for Heparin Indication: atrial fibrillation  Patient Measurements: Height: 5\' 4"  (162.6 cm) Weight: 179 lb 10.8 oz (81.5 kg) IBW/kg (Calculated) : 54.7 Heparin Dosing Weight: 75 kg  Vital Signs: Temp: 97.5 F (36.4 C) (09/19 0800) Temp Source: Oral (09/19 0800) BP: 102/59 (09/19 1000) Pulse Rate: 92 (09/19 1000)  Labs:  Recent Labs  10/04/15 0750  10/04/15 1616 10/05/15 0515 10/05/15 1634 10/06/15 0402 10/06/15 0403  HGB 9.2*  --   --  9.1*  --   --  8.5*  HCT 31.6*  --   --  30.9*  --   --  28.5*  PLT 245  --   --  197  --   --  131*  APTT 95*  --  94* 72*  --  65*  --   LABPROT 16.5*  --   --  15.2  --  15.2  --   INR 1.32  --   --  1.19  --  1.19  --   HEPARINUNFRC 0.90*  --   --  0.51  --  0.32  --   CREATININE 1.07*  < >  --  1.08* 1.03*  --  0.94  < > = values in this interval not displayed.  Estimated Creatinine Clearance: 52.6 mL/min (by C-G formula based on SCr of 0.94 mg/dL).  Medications: Heparin @ 1150 units/hr  Assessment: 47 yof on Xarelto PTA for atrial fibrillation (last dose 9/10) now changed to IV heparin in setting of acute shock liver and CRRT. AST/ALT trending down, INR trending down. Patient noted to be high bleed risk and dosing cautiously, will target lower range of aPTT goal.   Both heparin level and aPTT are therapeutic and correlating today at 0.32 and 65 seconds respectively. Will transition to monitoring heparin levels only. CBC stable. No bleeding.  Goal of Therapy:  Heparin level 0.3-0.7 APTT 66-90 seconds Monitor platelets by anticoagulation protocol: Yes   Plan:  1) Continue heparin at 1150 units/hr 2) Check daily heparin level 3) Monitor CBC, S/Sx bleeding daily  Arrie Senate, PharmD PGY-1 Pharmacy Resident Pager: [phone] 10/06/2015

## 2015-10-06 NOTE — Progress Notes (Signed)
Pt noted to have HR in 140's sustained without any stimulus. PRN med given, CRRT decreased to run even and blood flow rate decreased. RN will continue to monitor.

## 2015-10-06 NOTE — Progress Notes (Signed)
Patient ID: Cristina [name], female   DOB: 11-May-1940, 75 y.o.   MRN: [id number]  [facility] Progress Note   Subjective:    Current CRRT all 4K fluids (500/300/1500), effluent dose adequate at 24 No catheter issues or filter issues Tolerating net neg 50/hour Gave albumin 25 gm X 3 doses for mobilization of 3rd spp fluid Weight down about 10 kg, close to admit weight, but still with edema  Resp wise issues with weaning and may require trach    Objective:   BP 107/79 (BP Location: Right Leg)   Pulse 98   Temp 97.5 F (36.4 C) (Oral)   Resp 16   Ht 5\' 4"  (1.626 m)   Wt 81.5 kg (179 lb 10.8 oz)   SpO2 100%   BMI 30.84 kg/m   Intake/Output Summary (Last 24 hours) at 10/06/15 0849 Last data filed at 10/06/15 0800  Gross per 24 hour  Intake          1821.61 ml  Output             3008 ml  Net         -1186.39 ml   Weight change: -1.9 kg (-4 lb 3 oz)   Weight trending  10/06/15 0406  81.5 kg (179 lb 10.8 oz)   10/05/15 0500  83.4 kg (183 lb 13.8 oz)   10/04/15 0400  84 kg (185 lb 3 oz)   10/03/15 0407  83.5 kg (184 lb 1.4 oz)   10/02/15 0341  86.8 kg (191 lb 5.8 oz)   10/01/15 0400  91.4 kg (201 lb 8 oz)   09/30/15 0400  93.3 kg (205 lb 11 oz)   09/29/15 0500  92.7 kg (204 lb 5.9 oz) CRRT initiated  09/28/15 0500  90.2 kg (198 lb 13.7 oz)    09/24/15 0935  81.2 kg (179 lb       Physical Exam: CVP 10 Intubated, sedated  Lines R IJ HD cath (9/13), left IJ TLC, ETT Pulse irregular irreg, S1 and S2 normal Clear anteriorly Soft, obese, not distended, grimaces with exam 1+ edema both LE's and UE's with pitting edema to the thighs Very small amount dark yellow rine  Imaging: Dg Chest Port 1 View  Result Date: 10/06/2015 CLINICAL DATA:  Respiratory failure, shortness of Breath EXAM: PORTABLE CHEST 1 VIEW COMPARISON:  10/05/2015 FINDINGS: Support devices are stable. Mild cardiomegaly. Vascular congestion. Bilateral lower lobe airspace opacities are  noted, left greater than right, similar to prior study. Possible small effusions. No acute bony abnormality. IMPRESSION: Bibasilar atelectasis or infiltrates with probable small effusions. Cardiomegaly, vascular congestion. No real change. Electronically Signed   By: Rolm Baptise M.D.   On: 10/06/2015 07:31   Dg Chest Port 1 View  Result Date: 10/05/2015 CLINICAL DATA:  Acute respiratory failure EXAM: PORTABLE CHEST 1 VIEW COMPARISON:  10/04/2015 FINDINGS: Cardiac shadow remains mildly enlarged. [name] nasogastric catheter is seen coiled within the stomach. Endotracheal tube and bilateral jugular central lines are again identified and stable. Lungs are well aerated bilaterally. Minimal bibasilar atelectasis remains. No new focal infiltrate is noted. IMPRESSION: Minimal bibasilar atelectasis. Tubes and lines as described. Electronically Signed   By: Inez Catalina M.D.   On: 10/05/2015 07:46   Labs:  Recent Labs Lab 10/03/15 0500 10/03/15 1600 10/04/15 0750 10/04/15 1600 10/05/15 0515 10/05/15 1634 10/06/15 0403  NA 137 136 137 135 135 136 137  K 4.7 4.4 4.3 4.1 4.3 3.8 4.2  CL 103  103 102 103 102 102 102  CO2 29 29 30 28 28 29 29   GLUCOSE 94 99 108* 115* 105* 135* 119*  BUN 29* 27* 24* 24* 25* 21* 20  CREATININE 1.19* 1.02* 1.07* 1.02* 1.08* 1.03* 0.94  CALCIUM 8.7* 8.6* 8.7* 8.4* 8.9 8.8* 9.3  PHOS 2.4* 2.2* 1.9* 1.9* 2.4* 1.6* 2.1*     Recent Labs Lab 10/03/15 0500 10/04/15 0750 10/05/15 0515 10/06/15 0403  WBC 20.6* 17.7* 17.7* 15.4*  HGB 9.7* 9.2* 9.1* 8.5*  HCT 32.5* 31.6* 30.9* 28.5*  MCV 91.8 92.7 93.1 91.9  PLT 278 245 197 131*  Results for Cristina [name], Cristina [name] (MRN [id number]) as of 10/06/2015 08:56  Ref. Range 10/06/2015 04:03  Alkaline Phosphatase Latest Ref Range: 38 - 126 U/L 109  Albumin Latest Ref Range: 3.5 - 5.0 g/dL 4.0  AST Latest Ref Range: 15 - 41 U/L 25  ALT Latest Ref Range: 14 - 54 U/L 181 (H)  Total Protein Latest Ref Range: 6.5 - 8.1 g/dL 6.5  Bilirubin,  Direct Latest Ref Range: 0.1 - 0.5 mg/dL 0.8 (H)  Indirect Bilirubin Latest Ref Range: 0.3 - 0.9 mg/dL 1.2 (H)  Total Bilirubin Latest Ref Range: 0.3 - 1.2 mg/dL 2.0 (H)    Medications:    . arformoterol  15 mcg Nebulization BID  . aspirin  81 mg Oral Daily  . budesonide (PULMICORT) nebulizer solution  0.5 mg Nebulization BID  . chlorhexidine  15 mL Mouth Rinse BID  . citalopram  20 mg Per Tube QHS  . feeding supplement (PRO-STAT SUGAR FREE 64)  60 mL Per Tube BID  . insulin aspart  0-20 Units Subcutaneous Q4H  . mouth rinse  15 mL Mouth Rinse 10 times per day  . pantoprazole sodium  40 mg Per Tube Daily  . pramipexole  2 mg Oral QHS  . sodium chloride  250 mL Intravenous Once  . Vitamin D (Ergocalciferol)  50,000 Units Oral Q7 days   Infusions . dexmedetomidine 0.2 mcg/kg/hr (10/06/15 0745)  . feeding supplement (VITAL HIGH PROTEIN) 1,000 mL (10/05/15 1000)  . heparin 1,150 Units/hr (10/06/15 0307)  . norepinephrine (LEVOPHED) Adult infusion 1 mcg/min (10/06/15 0530)  . dialysis replacement fluid (prismasate) 500 mL/hr at 10/06/15 0618  . dialysis replacement fluid (prismasate) 300 mL/hr at 10/06/15 0821  . dialysate (PRISMASATE) 1,500 mL/hr at 10/06/15 0818   Background: 75 yo female with AF, HTN, HLD, gut, asthma - s/p fall with R distal humerus fx, repaired on 09/24/15. Developed bradycardia, hypotension, progressive renal failure >>likely from PNA, and medications (beta blocker/calcium channel blocker). Resultant hypoxemia respiratory failure, shock liver. Baseline creatinine around 0.7. CRRT initiated 09/29/15 for anuric renal failure, volume overload.(catheter placed 9/12 by CCM)  Assessment/ Plan:    Acute kidney injury: Ischemic ATN secondary to profound/sustained hypotension.  Remains oliguric  Dialysis dependent since 09/29/15 Weight down about 10 kg, close to admit weight, but still with edema Continue current CRRT and continue to pull neg 50/hour given UE and LE  edema (w/stable CVP of 10) K and phos stable (last phos replacement was 9/14) On low-dose norepi so still unable to transition to intermittent hemodialysis   Hypoxic respiratory failure/sepsis/pneumonia: Finished levaquin  Status post fracture of the humerus and open reduction and internal fixation.  Appears to be doing well from [name] surgical standpoint  Elevated LFTs/coagulopathy: From shock liver AST normal, ALT stilll mild elev, bili 2 - improved Last INR 1.1  Anemia: Anemia of critical illness/blood loss following recent fracture  and surgery-low iron stores but elevated ferritin prohibits intravenous iron therapy at this time.   Jamal Maes, MD [facility] [phone] Pager 10/06/2015, 8:49 AM

## 2015-10-06 NOTE — Progress Notes (Signed)
PULMONARY / CRITICAL CARE MEDICINE   Name: Cristina [name] MRN: [id number] DOB: Nov 06, 1940    ADMISSION DATE:  09/24/2015 CONSULTATION DATE:  09/27/15  REFERRING MD: Dr. Erlinda Hong  CHIEF COMPLAINT:  Fall   SUBJECTIVE:  Good Vt with pressure support, but low RR.  Still on low dose pressors.  VITAL SIGNS: BP 113/73   Pulse 90   Temp 97.3 F (36.3 C) (Oral)   Resp 16   Ht 5\' 4"  (1.626 m)   Wt 179 lb 10.8 oz (81.5 kg)   SpO2 100%   BMI 30.84 kg/m   HEMODYNAMICS: CVP:  [8 mmHg-12 mmHg] 10 mmHg  VENTILATOR SETTINGS: Vent Mode: PRVC FiO2 (%):  [30 %] 30 % Set Rate:  [16 bmp] 16 bmp Vt Set:  [500 mL] 500 mL PEEP:  [5 cmH20] 5 cmH20 Plateau Pressure:  [16 cmH20-25 cmH20] 25 cmH20  INTAKE / OUTPUT: I/O last 3 completed shifts: In: 2539.3 [I.V.:1189.3; NG/GT:1100; IV Piggyback:250] Out: [id number] [Urine:122; [id number]  PHYSICAL EXAMINATION: General: sedated Neuro: RASS -1 HEENT: ETT in place Cardiac: irregular Chest: no wheeze Abd: soft, non tender Ext: 1+ edema, Rt arm in wrap Skin: no rashes  LABS:  BMET  Recent Labs Lab 10/05/15 0515 10/05/15 1634 10/06/15 0403  NA 135 136 137  K 4.3 3.8 4.2  CL 102 102 102  CO2 28 29 29   BUN 25* 21* 20  CREATININE 1.08* 1.03* 0.94  GLUCOSE 105* 135* 119*   Electrolytes  Recent Labs Lab 10/04/15 0750  10/05/15 0515 10/05/15 1634 10/06/15 0403  CALCIUM 8.7*  < > 8.9 8.8* 9.3  MG 2.4  --  2.4  --  2.4  PHOS 1.9*  < > 2.4* 1.6* 2.1*  < > = values in this interval not displayed.   CBC  Recent Labs Lab 10/04/15 0750 10/05/15 0515 10/06/15 0403  WBC 17.7* 17.7* 15.4*  HGB 9.2* 9.1* 8.5*  HCT 31.6* 30.9* 28.5*  PLT 245 197 131*   Coag's  Recent Labs Lab 10/04/15 0750 10/04/15 1616 10/05/15 0515 10/06/15 0402  APTT 95* 94* 72* 65*  INR 1.32  --  1.19 1.19    Sepsis Markers No results for input(s): LATICACIDVEN, PROCALCITON, O2SATVEN in the last 168 hours.  ABG  Recent Labs Lab 09/30/15 0431  09/30/15 0851 10/01/15 0337  PHART 7.216* 7.154* 7.427  PCO2ART 56.2* 70.5* 39.1  PO2ART 124* 132.0* 150.0*   Liver Enzymes  Recent Labs Lab 10/01/15 0500  10/03/15 0500  10/05/15 0515 10/05/15 1634 10/06/15 0403  AST 376*  --  83*  --   --   --  25  ALT 1,313*  --  674*  --   --   --  181*  ALKPHOS 181*  --  173*  --   --   --  109  BILITOT 1.9*  --  1.4*  --   --   --  2.0*  ALBUMIN 2.8*  2.8*  < > 3.0*  2.9*  < > 3.2* 3.3* 4.0  4.0  < > = values in this interval not displayed.   Cardiac Enzymes No results for input(s): TROPONINI, PROBNP in the last 168 hours.  Glucose  Recent Labs Lab 10/05/15 0901 10/05/15 1105 10/05/15 1554 10/05/15 2046 10/05/15 2345 10/06/15 0341  GLUCAP 407* 113* 134* 134* 112* 119*   Imaging Dg Chest Port 1 View  Result Date: 10/06/2015 CLINICAL DATA:  Respiratory failure, shortness of Breath EXAM: PORTABLE CHEST 1 VIEW COMPARISON:  10/05/2015 FINDINGS: Support  devices are stable. Mild cardiomegaly. Vascular congestion. Bilateral lower lobe airspace opacities are noted, left greater than right, similar to prior study. Possible small effusions. No acute bony abnormality. IMPRESSION: Bibasilar atelectasis or infiltrates with probable small effusions. Cardiomegaly, vascular congestion. No real change. Electronically Signed   By: Rolm Baptise M.D.   On: 10/06/2015 07:31  R>L basilar atx  STUDIES:  2D echo 9/10 >> EF [id number], mod MR, systolic function moderately reduced, PA peak pressure 83mm  CULTURES: Blood 9/10 >ng Sputum 9/10 > normal flora  MRSA 9/10 >neg  ANTIBIOTICS: Levaquin 9/10 > 9/16 Vancomycin 9/10 > 9/13  SIGNIFICANT EVENTS: 9/07 admit after fall. Had repair of right shoulder surgery. 9/10 PCCM consulted for hypotension, bradycardia. Intubated for airway.  Xarelto stopped. 9/12 Renal consulted, start CRRT  LINES/TUBES: L IJ CVL 9/10>>> ETT 9/11>>> R IJ HD catheter 9/12>>>  DISCUSSION: 75 yo female had fall and  developed Rt distal humerus fx.  Had repair on 9/07.  Developed bradycardia, hypotension, progressive renal failure >> likely from PNA, and medications (beta blocker/calcium channel blocker).  PMHx of A fib on xarelto, HTN, HLD, Gout, Asthma, Anxiety.   ASSESSMENT / PLAN:  PULMONARY A: Acute hypoxic respiratory failure 2nd to HCAP, acute pulmonary edema. Hx of asthma. P:   Pressure support wean as tolerated Will need to d/w family if trach should be considered is she doesn't make more progress F/u CXR intermittently Continue pulmicort Add brovana Prn duoneb  CARDIOVASCULAR A:  Shock 2nd to sepsis from PNA and bradycardia from beta blocker/calcium channel blocker. Chronic A fib. Elevated troponin 2nd to demand ischemia. Moderate mitral regurgitation. Hx of HTN, HLD. P:  Heparin gtt per pharmacy Hold outpt lipitor due to elevated LFT's >> might be able to resume soon Avoiding amiodarone use due to elevated LFT's >> could use if needed as LFT's improve Hold lopressor while on levophed Nuclear stress test once recovered  RENAL A:   AKI 2nd to ischemia >> baseline creatinine 0.77 from 08/19/15. P:   CRRT per renal -keep even balance  GASTROINTESTINAL A:   Shock liver 2nd to ischemia - improving slowly.  Nutrition. P:   Tube feeds Protonix for SUP  HEMATOLOGIC A:   Anemia of critical illness. Coagulopathy - r/t shock liver >> resolved. P:  F/u CBC  INFECTIOUS A:   Sepsis 2nd to HCAP >> completed Abx 9/16. P:   Monitor off Abx  ENDOCRINE A:   Relative adrenal insufficiency - cortisol 14.3 from 09/28/15 >> off solu cortef. Hyperglycemia. P:   SSI while on tube feeds  NEUROLOGIC A:   Acute metabolic encephalopathy. Hx of anxiety >> this is contributing to difficulty with vent weaning. Hx of RLS. P:   RASS goal: 0 to -1 Precedex with prn fentanyl Add back celexa Hs mirapex Hold outpt xanax  ORTHOPEDICS A: Rt humerus fracture. P: Per  ortho  FAMILY  - Updates: Daughters updated at bedside 9/16 - Inter-disciplinary family meet or Palliative Care meeting due by:  9/16 (completed)  CC time 33 minutes.  [name], MD [facility] 10/06/2015, 8:22 AM Pager:  [phone] After 3pm call: [phone]

## 2015-10-06 NOTE — Progress Notes (Signed)
Patient Name: Cristina [name] Date of Encounter: 10/06/2015  Active Problems:   S/P ORIF (open reduction internal fixation) fracture   Hypotension due to drugs   Bradycardia   Acute respiratory failure with hypoxia (HCC)   Elevated troponin   Orthostatic hypotension   Encounter for central line placement   Persistent atrial fibrillation ([place])   Pressure ulcer   Acute hypoxemic respiratory failure ([place])   Length of Stay: 10  SUBJECTIVE  Intubated / sedated on vent   . aspirin  81 mg Oral Daily  . budesonide (PULMICORT) nebulizer solution  0.5 mg Nebulization BID  . chlorhexidine  15 mL Mouth Rinse BID  . citalopram  20 mg Per Tube QHS  . feeding supplement (PRO-STAT SUGAR FREE 64)  60 mL Per Tube BID  . insulin aspart  0-20 Units Subcutaneous Q4H  . ipratropium-albuterol  3 mL Nebulization Q6H  . mouth rinse  15 mL Mouth Rinse 10 times per day  . pantoprazole sodium  40 mg Per Tube Daily  . pramipexole  2 mg Oral QHS  . sodium chloride  250 mL Intravenous Once  . Vitamin D (Ergocalciferol)  50,000 Units Oral Q7 days     Intake/Output Summary (Last 24 hours) at 10/06/15 0738 Last data filed at 10/06/15 0700  Gross per 24 hour  Intake          1775.53 ml  Output             2993 ml  Net         -1217.47 ml   Filed Weights   10/04/15 0400 10/05/15 0500 10/06/15 0406  Weight: 84 kg (185 lb 3 oz) 83.4 kg (183 lb 13.8 oz) 81.5 kg (179 lb 10.8 oz)    PHYSICAL EXAM Vitals:   10/06/15 0500 10/06/15 0530 10/06/15 0600 10/06/15 0700  BP: (!) 71/51 (!) 93/55 94/73 113/73  Pulse: (!) 104 88 96 90  Resp: 19 16 16 16   Temp:      TempSrc:      SpO2: 100% 100% 100% 100%  Weight:      Height:       General: sedated, mechanically ventilated via ETT Head: no evidence of trauma, PERRL, EOMI, no exophtalmos or lid lag, Neck: normal jugular venous pulsations and no hepatojugular reflux; brisk carotid pulses without delay and no carotid bruits Chest: clear to auscultation,  no signs of consolidation by percussion or palpation, normal fremitus, symmetrical and full respiratory excursions Cardiovascular: normal position and quality of the apical impulse, irregular rhythm, normal first and second heart sounds, no rubs or gallops, no murmur Abdomen: no tenderness or distention, no masses by palpation, no abnormal pulsatility or arterial bruits, normal bowel sounds, no hepatosplenomegaly Extremities: no clubbing, cyanosis or edema; Neurological: grossly nonfocal, sedated.   LABS  CBC  Recent Labs  10/05/15 0515 10/06/15 0403  WBC 17.7* 15.4*  HGB 9.1* 8.5*  HCT 30.9* 28.5*  MCV 93.1 91.9  PLT 197 [id number]*   Basic Metabolic Panel  Recent Labs  10/05/15 0515 10/05/15 1634 10/06/15 0403  NA 135 136 137  K 4.3 3.8 4.2  CL 102 102 102  CO2 28 29 29   GLUCOSE 105* 135* 119*  BUN 25* 21* 20  CREATININE 1.08* 1.03* 0.94  CALCIUM 8.9 8.8* 9.3  MG 2.4  --  2.4  PHOS 2.4* 1.6* 2.1*   Liver Function Tests  Recent Labs  10/05/15 1634 10/06/15 0403  AST  --  25  ALT  --  181*  ALKPHOS  --  109  BILITOT  --  2.0*  PROT  --  6.5  ALBUMIN 3.3* 4.0  4.0    Radiology Studies Imaging results have been reviewed and Dg Chest Port 1 View  Result Date: 10/06/2015 CLINICAL DATA:  Respiratory failure, shortness of Breath EXAM: PORTABLE CHEST 1 VIEW COMPARISON:  10/05/2015 FINDINGS: Support devices are stable. Mild cardiomegaly. Vascular congestion. Bilateral lower lobe airspace opacities are noted, left greater than right, similar to prior study. Possible small effusions. No acute bony abnormality. IMPRESSION: Bibasilar atelectasis or infiltrates with probable small effusions. Cardiomegaly, vascular congestion. No real change. Electronically Signed   By: Rolm Baptise M.D.   On: 10/06/2015 07:31   Dg Chest Port 1 View  Result Date: 10/05/2015 CLINICAL DATA:  Acute respiratory failure EXAM: PORTABLE CHEST 1 VIEW COMPARISON:  10/04/2015 FINDINGS: Cardiac shadow  remains mildly enlarged. A nasogastric catheter is seen coiled within the stomach. Endotracheal tube and bilateral jugular central lines are again identified and stable. Lungs are well aerated bilaterally. Minimal bibasilar atelectasis remains. No new focal infiltrate is noted. IMPRESSION: Minimal bibasilar atelectasis. Tubes and lines as described. Electronically Signed   By: Inez Catalina M.D.   On: 10/05/2015 07:46    TELE atrial fibrillation with mild RVR  ASSESSMENT AND PLAN  1. Sepsis: off pressors plan per CCM 2.  Elevated troponin most likely secondary to demand ischemia in setting of sepsis from PNA and recent trauma from fall in setting of acute kidney injury. Normal LVEF 50-55%, but possible regional wall motion abnormalities and moderate MR, moderately reduced RVF and moderate pulmonary HTN with PASP 17mmHg. EF in July was 65%. Continue ASA for now. Statin stopped due to elevated LFTs. She did have evidence of coronary calcifications on chest CT last year.  Nuclear stress test once recovered from acute illness. 3. Pneumonia/ventilator-dependent respiratory failure: Antibiotics per CCM.   4. Persistent atrial fibrillation - junctional rhythm earlier due to sepsis, beta blockers and calcium channel blockers, has resolved. Xarelto was stopped on 9/10. Started Heparin IV . Decision on long term anticoagulant will depend on progress of renal and liver function. Stopped scheduled beta blocker due to hypotension. Avoiding IV Amio due to elevated LFTs and recent shock liver, but these are improving steadily and this will likely be best option unless BP improves.  LFTls pending from this am.  Rate control fine unless she gets agitated 5.  Recent fall with ORIF of distal humeral fx. 6. Anuric renal failure:  CRRT discussed with Dr Lorrene Reid  Tea colored urine still with very  Little output BUN/Cr normalized on CRRT    Jenkins Rouge Patient ID: Cristina [name], female   DOB: [birth year], 75 y.o.    MRN: [id number]

## 2015-10-07 ENCOUNTER — Inpatient Hospital Stay (HOSPITAL_COMMUNITY): Payer: Medicare Other

## 2015-10-07 ENCOUNTER — Encounter (HOSPITAL_COMMUNITY): Payer: Medicare Other

## 2015-10-07 DIAGNOSIS — Z9289 Personal history of other medical treatment: Secondary | ICD-10-CM

## 2015-10-07 DIAGNOSIS — Z930 Tracheostomy status: Secondary | ICD-10-CM

## 2015-10-07 DIAGNOSIS — Z452 Encounter for adjustment and management of vascular access device: Secondary | ICD-10-CM

## 2015-10-07 DIAGNOSIS — J9600 Acute respiratory failure, unspecified whether with hypoxia or hypercapnia: Secondary | ICD-10-CM

## 2015-10-07 DIAGNOSIS — Z419 Encounter for procedure for purposes other than remedying health state, unspecified: Secondary | ICD-10-CM

## 2015-10-07 LAB — RENAL FUNCTION PANEL
ALBUMIN: 3.5 g/dL (ref 3.5–5.0)
ANION GAP: 7 (ref 5–15)
Albumin: 1.9 g/dL — ABNORMAL LOW (ref 3.5–5.0)
Albumin: 3.2 g/dL — ABNORMAL LOW (ref 3.5–5.0)
Anion gap: 11 (ref 5–15)
Anion gap: 8 (ref 5–15)
BUN: 22 mg/dL — ABNORMAL HIGH (ref 6–20)
BUN: 85 mg/dL — AB (ref 6–20)
BUN: 24 mg/dL — ABNORMAL HIGH (ref 6–20)
CALCIUM: 9.4 mg/dL (ref 8.9–10.3)
CALCIUM: 7.8 mg/dL — AB (ref 8.9–10.3)
CALCIUM: 9.2 mg/dL (ref 8.9–10.3)
CO2: 28 mmol/L (ref 22–32)
CO2: 23 mmol/L (ref 22–32)
CO2: 28 mmol/L (ref 22–32)
CREATININE: 3.34 mg/dL — AB (ref 0.44–1.00)
CREATININE: 1 mg/dL (ref 0.44–1.00)
Chloride: 103 mmol/L (ref 101–111)
Chloride: 106 mmol/L (ref 101–111)
Chloride: 103 mmol/L (ref 101–111)
Creatinine, Ser: 0.95 mg/dL (ref 0.44–1.00)
GFR calc Af Amer: >60 mL/min (ref >60)
GFR calc Af Amer: >60 mL/min (ref >60)
GFR calc non Af Amer: 57 mL/min — ABNORMAL LOW (ref >60)
GFR calc non Af Amer: 12 mL/min — ABNORMAL LOW (ref >60)
GFR calc non Af Amer: 53 mL/min — ABNORMAL LOW (ref >60)
GFR, EST AFRICAN AMERICAN: 14 mL/min — AB (ref >60)
GLUCOSE: 113 mg/dL — AB (ref 65–99)
GLUCOSE: 160 mg/dL — AB (ref 65–99)
GLUCOSE: 123 mg/dL — AB (ref 65–99)
PHOSPHORUS: 1.7 mg/dL — AB (ref 2.5–4.6)
Phosphorus: 5.8 mg/dL — ABNORMAL HIGH (ref 2.5–4.6)
Phosphorus: 2.3 mg/dL — ABNORMAL LOW (ref 2.5–4.6)
Potassium: 4.2 mmol/L (ref 3.5–5.1)
Potassium: 3.8 mmol/L (ref 3.5–5.1)
Potassium: 4.2 mmol/L (ref 3.5–5.1)
SODIUM: 138 mmol/L (ref 135–145)
SODIUM: 140 mmol/L (ref 135–145)
SODIUM: 139 mmol/L (ref 135–145)

## 2015-10-07 LAB — CBC
HCT: 29.4 % — ABNORMAL LOW (ref 36.0–46.0)
Hemoglobin: 8.7 g/dL — ABNORMAL LOW (ref 12.0–15.0)
MCH: 27.3 pg (ref 26.0–34.0)
MCHC: 29.6 g/dL — AB (ref 30.0–36.0)
MCV: 92.2 fL (ref 78.0–100.0)
PLATELETS: 135 10*3/uL — AB (ref 150–400)
RBC: 3.19 MIL/uL — AB (ref 3.87–5.11)
RDW: 16.9 % — ABNORMAL HIGH (ref 11.5–15.5)
WBC: 17 10*3/uL — ABNORMAL HIGH (ref 4.0–10.5)

## 2015-10-07 LAB — GLUCOSE, CAPILLARY
GLUCOSE-CAPILLARY: 100 mg/dL — AB (ref 65–99)
GLUCOSE-CAPILLARY: 99 mg/dL (ref 65–99)
GLUCOSE-CAPILLARY: 100 mg/dL — AB (ref 65–99)
Glucose-Capillary: 110 mg/dL — ABNORMAL HIGH (ref 65–99)
Glucose-Capillary: 115 mg/dL — ABNORMAL HIGH (ref 65–99)

## 2015-10-07 LAB — MAGNESIUM: Magnesium: 2.6 mg/dL — ABNORMAL HIGH (ref 1.7–2.4)

## 2015-10-07 MED ORDER — SODIUM PHOSPHATES 45 MMOLE/15ML IV SOLN
10.0000 mmol | Freq: Once | INTRAVENOUS | Status: AC
  Administered 2015-10-07: 10 mmol via INTRAVENOUS
  Filled 2015-10-07: qty 3.33

## 2015-10-07 MED ORDER — HEPARIN (PORCINE) IN NACL 100-0.45 UNIT/ML-% IJ SOLN: 1150.0000 [IU]/h | INTRAMUSCULAR | Status: DC

## 2015-10-07 MED ORDER — FENTANYL CITRATE (PF) 100 MCG/2ML IJ SOLN
INTRAMUSCULAR | Status: AC
  Administered 2015-10-07: 100 ug
  Filled 2015-10-07: qty 2

## 2015-10-07 MED ORDER — MIDAZOLAM HCL 2 MG/2ML IJ SOLN
INTRAMUSCULAR | Status: AC
  Filled 2015-10-07: qty 2

## 2015-10-07 MED ORDER — MIDAZOLAM HCL 2 MG/2ML IJ SOLN
INTRAMUSCULAR | Status: AC
  Administered 2015-10-07: 2 mg
  Filled 2015-10-07: qty 2

## 2015-10-07 MED ORDER — HEPARIN (PORCINE) IN NACL 100-0.45 UNIT/ML-% IJ SOLN
1450.0000 [IU]/h | INTRAMUSCULAR | Status: DC
  Administered 2015-10-07: 1150 [IU]/h via INTRAVENOUS
  Administered 2015-10-08: 1300 [IU]/h via INTRAVENOUS
  Administered 2015-10-10 – 2015-10-12 (×3): 1450 [IU]/h via INTRAVENOUS
  Filled 2015-10-07 (×7): qty 250

## 2015-10-07 MED ORDER — FENTANYL CITRATE (PF) 100 MCG/2ML IJ SOLN
INTRAMUSCULAR | Status: AC
  Filled 2015-10-07: qty 2

## 2015-10-07 NOTE — Procedures (Signed)
Bronchoscopy Procedure Note GIANNAMARIE CIPRIAN [id number] 02-Mar-1940  Procedure: Bronchoscopy Indications: to assist w/ trach placement   Procedure Details Consent: Risks of procedure as well as the alternatives and risks of each were explained to the (patient/caregiver).  Consent for procedure obtained. Time Out: Verified patient identification, verified procedure, site/side was marked, verified correct patient position, special equipment/implants available, medications/allergies/relevent history reviewed, required imaging and test results available.  Performed  In preparation for procedure, patient was given 100% FiO2 and bronchoscope lubricated. Sedation: Benzodiazepines, Muscle relaxants and Etomidate Procedure done by P Babcock ACNP-BC.[name] Kitchen At first bronch was introduce through ET tube and structures of tracheal rings, carina identified for operator of tracheostomy who was Dr  Titus Mould. Light of bronch passed through trachea and skin for indentification of tracheal rings for tracheostomy puncture. After this, under bronchoscopy guidance,  ET tube was pulled back sufficiently and very carefully. The ET tube was  pulled back enough to give room for tracheostomy operator and yet at same time to to ensure a secured airway. After this was accomplished, bronchoscope was withdrawn into the ET tube. After this,  Dr Titus Mould  then performed tracheostomy under video visual provided by flexible video bronchoscopy. Followng introduction of tracheostomy,  the bronchoscope was removed from ET tube and introduced through tracheostomy. Correct position of tracheostomy was ensured, with enough room between carina and distal tracheostomy and no evidence of bleeding. The bronchoscope was then withdrawn. Respiratory therapist was then instructed to remove the ET tube.  Dr Titus Mould then proceeded to complete the tracheostomy with stay sutures   No complications   Clementeen Graham 10/07/2015 Erick Colace  ACNP-BC [place] Pager # [phone] OR # [phone] if no answer

## 2015-10-07 NOTE — Progress Notes (Signed)
Pt got trach inserted today. Spoke w pa pete babcock and not ready for ltac eval yet. Will cont to follow and assist as pt progresses.

## 2015-10-07 NOTE — Progress Notes (Signed)
PULMONARY / CRITICAL CARE MEDICINE   Name: Cristina [name] MRN: [id number] DOB: 10/29/1940    ADMISSION DATE:  09/24/2015 CONSULTATION DATE:  09/27/15  REFERRING MD: Dr. Erlinda Hong  CHIEF COMPLAINT:  Fall   SUBJECTIVE:  Plan for trach today.  VITAL SIGNS: BP (!) 104/54   Pulse 77   Temp 97.7 F (36.5 C)   Resp 16   Ht 5\' 4"  (1.626 m)   Wt 176 lb 2.4 oz (79.9 kg)   SpO2 100%   BMI 30.24 kg/m   HEMODYNAMICS: CVP:  [9 mmHg-11 mmHg] 10 mmHg  VENTILATOR SETTINGS: Vent Mode: PRVC FiO2 (%):  [30 %] 30 % Set Rate:  [16 bmp] 16 bmp Vt Set:  [500 mL] 500 mL PEEP:  [5 cmH20] 5 cmH20 Pressure Support:  [10 cmH20] 10 cmH20 Plateau Pressure:  [15 cmH20-17 cmH20] 17 cmH20  INTAKE / OUTPUT: I/O last 3 completed shifts: In: 2036.7 [I.V.:1078.3; NG/GT:958.3] Out: 3784 [Urine:35; Other:3749]  PHYSICAL EXAMINATION: General: sedated Neuro: RASS -1 HEENT: ETT in place Cardiac: irregular Chest: no wheeze Abd: soft, non tender Ext: 1+ edema, Rt arm in wrap Skin: no rashes  LABS:  BMET  Recent Labs Lab 10/06/15 0403 10/06/15 1600 10/07/15 0434  NA 137 138 138  K 4.2 4.2 4.2  CL 102 101 103  CO2 29 29 28   BUN 20 18 22*  CREATININE 0.94 0.74 0.95  GLUCOSE 119* 129* 113*   Electrolytes  Recent Labs Lab 10/04/15 0750  10/05/15 0515  10/06/15 0403 10/06/15 1600 10/07/15 0434  CALCIUM 8.7*  < > 8.9  < > 9.3 9.1 9.4  MG 2.4  --  2.4  --  2.4  --   --   PHOS 1.9*  < > 2.4*  < > 2.1* 1.3* 1.7*  < > = values in this interval not displayed.   CBC  Recent Labs Lab 10/05/15 0515 10/06/15 0403 10/07/15 0431  WBC 17.7* 15.4* 17.0*  HGB 9.1* 8.5* 8.7*  HCT 30.9* 28.5* 29.4*  PLT 197 131* 135*   Coag's  Recent Labs Lab 10/04/15 0750 10/04/15 1616 10/05/15 0515 10/06/15 0402  APTT 95* 94* 72* 65*  INR 1.32  --  1.19 1.19    Sepsis Markers No results for input(s): LATICACIDVEN, PROCALCITON, O2SATVEN in the last 168 hours.  ABG  Recent Labs Lab  09/30/15 0851 10/01/15 0337  PHART 7.154* 7.427  PCO2ART 70.5* 39.1  PO2ART 132.0* 150.0*   Liver Enzymes  Recent Labs Lab 10/01/15 0500  10/03/15 0500  10/06/15 0403 10/06/15 1600 10/07/15 0434  AST 376*  --  83*  --  25  --   --   ALT 1,313*  --  674*  --  181*  --   --   ALKPHOS 181*  --  173*  --  109  --   --   BILITOT 1.9*  --  1.4*  --  2.0*  --   --   ALBUMIN 2.8*  2.8*  < > 3.0*  2.9*  < > 4.0  4.0 3.6 3.5  < > = values in this interval not displayed.   Cardiac Enzymes No results for input(s): TROPONINI, PROBNP in the last 168 hours.  Glucose  Recent Labs Lab 10/06/15 0736 10/06/15 1121 10/06/15 1622 10/06/15 1953 10/06/15 2312 10/07/15 0336  GLUCAP 153* 110* 120* 84 113* 110*   Imaging No results found.R>L basilar atx  STUDIES:  2D echo 9/10 >> EF [id number], mod MR, systolic function moderately reduced,  PA peak pressure 34mm  CULTURES: Blood 9/10 >negative Sputum 9/10 > normal flora  MRSA 9/10 >negative  ANTIBIOTICS: Levaquin 9/10 > 9/16 Vancomycin 9/10 > 9/13  SIGNIFICANT EVENTS: 9/07 admit after fall. Had repair of right shoulder surgery. 9/10 PCCM consulted for hypotension, bradycardia. Intubated for airway.  Xarelto stopped. 9/12 Renal consulted, start CRRT 9/19 Family meeting >> limited resuscitation; off pressors  LINES/TUBES: L IJ CVL 9/10>>> ETT 9/11>>> R IJ HD catheter 9/12>>>  DISCUSSION: 75 yo female had fall and developed Rt distal humerus fx.  Had repair on 9/07.  Developed bradycardia, hypotension, progressive renal failure >> likely from PNA, and medications (beta blocker/calcium channel blocker).  PMHx of A fib on xarelto, HTN, HLD, Gout, Asthma, Anxiety.   ASSESSMENT / PLAN:  PULMONARY A: Acute hypoxic respiratory failure 2nd to HCAP, acute pulmonary edema. Hx of asthma. P:   For Trach 9/20 F/u CXR Continue pulmicort. brovana, prn duoneb  CARDIOVASCULAR A:  Shock 2nd to sepsis from PNA and bradycardia from  beta blocker/calcium channel blocker. Chronic A fib. Elevated troponin 2nd to demand ischemia. Moderate mitral regurgitation. Hx of HTN, HLD. P:  Heparin gtt per pharmacy >> hold for trach 9/20 Likely transition to enteral anticoagulation 9/21 Hold outpt lipitor due to elevated LFT's >> might be able to resume soon Avoiding amiodarone use due to elevated LFT's >> could use if needed as LFT's improve Hold lopressor while on levophed Nuclear stress test once recovered  RENAL A:   AKI 2nd to ischemia >> baseline creatinine 0.77 from 08/19/15. P:   CRRT per renal >> even to negative fluid balance as tolerated  GASTROINTESTINAL A:   Shock liver 2nd to ischemia - improving slowly.  Nutrition. P:   Tube feeds >> on hold 9/20 for trach Protonix for SUP F/u LFTs  HEMATOLOGIC A:   Anemia of critical illness. Mild thrombocytopenia. Coagulopathy - r/t shock liver >> resolved. P:  F/u CBC  INFECTIOUS A:   Sepsis 2nd to HCAP >> completed Abx 9/16. P:   Monitor off Abx  ENDOCRINE A:   Relative adrenal insufficiency - cortisol 14.3 from 09/28/15 >> off solu cortef. Hyperglycemia. P:   SSI while on tube feeds  NEUROLOGIC A:   Acute metabolic encephalopathy. Hx of anxiety >> this is contributing to difficulty with vent weaning. Hx of RLS. P:   RASS goal: 0 to -1 Precedex with prn fentanyl Continue celexa, mirapex Hold outpt xanax  ORTHOPEDICS A: Rt humerus fracture. P: Per ortho  GOALS OF CARE A: Limited resuscitation. P: Family okay with trach and short term vent support Would not want SNF with long term HD and vent support No CPR/defibrillation  Updated pt's daughter at bedside.  CC time 32 minutes.  [name], MD [facility] Pulmonary/Critical Care 10/07/2015, 8:09 AM Pager:  [phone] After 3pm call: [phone]

## 2015-10-07 NOTE — Progress Notes (Signed)
Nutrition Follow-up  DOCUMENTATION CODES:   Obesity unspecified  INTERVENTION:   If pt continues to desire aggressive measures, recommend:  Continue Vital High Protein at goal of 25 ml/hr (600 ml/day) via OG tube  Continue 60 ml Prostat BID Provides: 1000 kcal, 112 grams protein, and 501 ml H2O.  NUTRITION DIAGNOSIS:   Inadequate oral intake related to inability to eat as evidenced by NPO status.  Ongoing  GOAL:   Provide needs based on ASPEN/SCCM guidelines  Progressing  MONITOR:   TF tolerance, Skin, I & O's, Labs, Vent status  REASON FOR ASSESSMENT:   Ventilator, Consult Enteral/tube feeding initiation and management  ASSESSMENT:   52 female, admitted after a fall, had repair of her right shoulder on 9/7, don't have chronic atrial fibrillation, becoming hypotensive overnight and noted to be bradycardic today. Was diaphoretic. Noted to be junctional rhythm with nonspecific ST-T wave changes. Intubated for airway protection.   Patient on ventilator support via trach.  MV: 10.0 L/min Temp (24hrs), Avg:97.7 F (36.5 C), Min:97.3 F (36.3 C), Max:98.4 F (36.9 C)  Propofol: n/a  Pt underwent trach placement today per PCCM service.   Case discussed with RN. TF has been off since 1200 due to trach placement; unsure of plan to resume currently. Remains on CRRT; plan to d/c today and see if renal function improves. Per discussion with RN, pt will transition to comfort measures if renal function does not improves.   Labs reviewed: Phos: 1.7, Mg: 2.6, CBGS: 84-113.  Diet Order:  Diet - low sodium heart healthy Diet general Diet NPO time specified  Skin:  Wound (see comment) (unstageable pressure ulcer to L heel, lower leg; incision on elbow)  Last BM:  9/9  Height:   Ht Readings from Last 1 Encounters:  10/01/15 5\' 4"  (1.626 m)    Weight:   Wt Readings from Last 1 Encounters:  10/07/15 176 lb 2.4 oz (79.9 kg)    Ideal Body Weight:  54.5 kg  BMI:   Body mass index is 30.24 kg/m.  Estimated Nutritional Needs:   Kcal:  [id number]  Protein:  >/= 109 grams  Fluid:  > 1.5 L/day  EDUCATION NEEDS:   No education needs identified at this time  Khristie Sak A. Jimmye Norman, RD, LDN, CDE Pager: [phone] After hours Pager: [phone]

## 2015-10-07 NOTE — Progress Notes (Signed)
Patient ID: Cristina [name], female   DOB: Aug 02, 1940, 75 y.o.   MRN: [id number]  [facility] Progress Note   Subjective:    Current CRRT all 4K fluids (500/300/1500) No catheter issues or filter issues Tolerating net neg 50/hour Weight down to 79.9 kg from max weight of 92.7 kg No UOP (10 cc/24 hours)  Getting trach by CCM today    Objective:   BP (!) 104/54   Pulse 77   Temp 97.7 F (36.5 C)   Resp 16   Ht 5\' 4"  (1.626 m)   Wt 79.9 kg (176 lb 2.4 oz)   SpO2 100%   BMI 30.24 kg/m   Intake/Output Summary (Last 24 hours) at 10/07/15 0848 Last data filed at 10/07/15 0600  Gross per 24 hour  Intake          1166.36 ml  Output             2407 ml  Net         -1240.64 ml    Weight trending  10/07/15  79.9 kg   10/06/15 0406  81.5 kg (179 lb 10.8 oz)   10/05/15 0500  83.4 kg (183 lb 13.8 oz)   10/04/15 0400  84 kg (185 lb 3 oz)   10/03/15 0407  83.5 kg (184 lb 1.4 oz)   10/02/15 0341  86.8 kg (191 lb 5.8 oz)   10/01/15 0400  91.4 kg (201 lb 8 oz)   09/30/15 0400  93.3 kg (205 lb 11 oz)   09/29/15 0500  92.7 kg (204 lb 5.9 oz) CRRT initiated  09/28/15 0500  90.2 kg (198 lb 13.7 oz)    09/24/15 0935  81.2 kg (179 lb     Physical Exam: CVP 10 Intubated, sedated  New trach in place and ETT and OGT just pulled out Lines R IJ HD cath (9/13), left IJ TLC Pulse irregular irreg, S1 and S2 normal Clear anteriorly Soft, obese, not distended, grimaces with exam 1+ edema both LE's and UE's with pitting edema to the thighs unchanged past 24 hours Very small amount dark yellow urine  Imaging: Dg Chest Port 1 View  Result Date: 10/06/2015 CLINICAL DATA:  Respiratory failure, shortness of Breath EXAM: PORTABLE CHEST 1 VIEW COMPARISON:  10/05/2015 FINDINGS: Support devices are stable. Mild cardiomegaly. Vascular congestion. Bilateral lower lobe airspace opacities are noted, left greater than right, similar to prior study. Possible small effusions. No acute  bony abnormality. IMPRESSION: Bibasilar atelectasis or infiltrates with probable small effusions. Cardiomegaly, vascular congestion. No real change. Electronically Signed   By: Rolm Baptise M.D.   On: 10/06/2015 07:31   Labs:  Recent Labs Lab 10/04/15 0750 10/04/15 1600 10/05/15 0515 10/05/15 1634 10/06/15 0403 10/06/15 1600 10/07/15 0434  NA 137 135 135 136 137 138 138  K 4.3 4.1 4.3 3.8 4.2 4.2 4.2  CL 102 103 102 102 102 101 103  CO2 30 28 28 29 29 29 28   GLUCOSE 108* 115* 105* 135* 119* 129* 113*  BUN 24* 24* 25* 21* 20 18 22*  CREATININE 1.07* 1.02* 1.08* 1.03* 0.94 0.74 0.95  CALCIUM 8.7* 8.4* 8.9 8.8* 9.3 9.1 9.4  PHOS 1.9* 1.9* 2.4* 1.6* 2.1* 1.3* 1.7*     Recent Labs Lab 10/04/15 0750 10/05/15 0515 10/06/15 0403 10/07/15 0431  WBC 17.7* 17.7* 15.4* 17.0*  HGB 9.2* 9.1* 8.5* 8.7*  HCT 31.6* 30.9* 28.5* 29.4*  MCV 92.7 93.1 91.9 92.2  PLT 245 197 131*  135*   Results for Cristina [name] (MRN [id number]) as of 10/06/2015 08:56  Ref. Range 10/06/2015 04:03  Alkaline Phosphatase Latest Ref Range: 38 - 126 U/L 109  Albumin Latest Ref Range: 3.5 - 5.0 g/dL 4.0  AST Latest Ref Range: 15 - 41 U/L 25  ALT Latest Ref Range: 14 - 54 U/L 181 (H)  Total Protein Latest Ref Range: 6.5 - 8.1 g/dL 6.5  Bilirubin, Direct Latest Ref Range: 0.1 - 0.5 mg/dL 0.8 (H)  Indirect Bilirubin Latest Ref Range: 0.3 - 0.9 mg/dL 1.2 (H)  Total Bilirubin Latest Ref Range: 0.3 - 1.2 mg/dL 2.0 (H)    Medications:    . arformoterol  15 mcg Nebulization BID  . aspirin  81 mg Oral Daily  . budesonide (PULMICORT) nebulizer solution  0.5 mg Nebulization BID  . chlorhexidine  15 mL Mouth Rinse BID  . citalopram  20 mg Per Tube QHS  . etomidate  40 mg Intravenous Once  . feeding supplement (PRO-STAT SUGAR FREE 64)  60 mL Per Tube BID  . fentaNYL      . fentaNYL      . fentaNYL (SUBLIMAZE) injection  200 mcg Intravenous Once  . insulin aspart  0-20 Units Subcutaneous Q4H  . mouth rinse  15  mL Mouth Rinse 10 times per day  . midazolam      . midazolam      . midazolam  4 mg Intravenous Once  . pantoprazole sodium  40 mg Per Tube Daily  . pramipexole  2 mg Oral QHS  . propofol  5-80 mcg/kg/min Intravenous Once  . sodium chloride  250 mL Intravenous Once  . vecuronium  10 mg Intravenous Once  . Vitamin D (Ergocalciferol)  50,000 Units Oral Q7 days   Infusions . dexmedetomidine 0.4 mcg/kg/hr (10/07/15 0101)  . feeding supplement (VITAL HIGH PROTEIN) Stopped (10/06/15 2356)  . norepinephrine (LEVOPHED) Adult infusion Stopped (10/06/15 1500)  . dialysis replacement fluid (prismasate) 500 mL/hr at 10/07/15 0239  . dialysis replacement fluid (prismasate) 300 mL/hr at 10/07/15 0109  . dialysate (PRISMASATE) 1,500 mL/hr at 10/07/15 0424   Background: 75 yo female with AF, HTN, HLD, gut, asthma - s/p fall with R distal humerus fx, repaired on 09/24/15. Developed bradycardia, hypotension, progressive renal failure >>likely from PNA, and medications (beta blocker/calcium channel blocker). Resultant hypoxemia respiratory failure, shock liver. Baseline creatinine around 0.7. CRRT initiated 09/29/15 for anuric renal failure, volume overload.(catheter placed 9/12 by CCM)  Assessment/ Plan:    Acute kidney injury: Ischemic ATN secondary to profound/sustained hypotension/PNA.  Remains anuric  Dialysis dependent since 09/29/15  (CRRT since 9/12) Weight down about 10 kg, close to admit weight but still with edema Off pressor support I think should transition to IHD at this time - will stop CRRT at end of this shift  Hypophosphatemia 10 mmoles phosphorus as Na phos this AM  Hypoxic respiratory failure/sepsis/pneumonia: Finished levaquin for LLL PNA S/p trach 9/20  S/p R humerus fx - op 09/24/15 Open treatment internal fixation of right supracondylar humerus fracture with intracondylar extension Olecranon osteotomy and ORIF of olecranon with internal fixation Subcutaneous transposition  of ulnar nerve at elbow  Elevated LFTs/coagulopathy/shock liver Resolved  Anemia: Hb stable   Jamal Maes, MD [place] Pager 10/07/2015, 8:48 AM

## 2015-10-07 NOTE — Procedures (Signed)
Name:  MARRANDA EMMERLING MRN:  [id number] DOB:  Jan 03, 1941  OPERATIVE NOTE  Procedure:  Percutaneous tracheostomy.  Indications:  Ventilator-dependent respiratory failure.  Consent:  Procedure, alternatives, risks and benefits discussed with medical POA.  Questions answered.  Consent obtained.  Anesthesia:  Prop, versed, fent, vec  Procedure summary:  Appropriate equipment was assembled.  The patient was identified as Cristina [name] and safety timeout was performed. The patient was placed in supine position with a towel roll behind shoulder blades and neck extended.  Sterile technique was used. The patient's neck and upper chest were prepped using chlorhexidine / alcohol scrub and the field was draped in usual sterile fashion with full body drape. After the adequate sedation / anesthesia was achieved, attention was directed at the midline trachea, where the cricothyroid membrane was palpated. Approximately two fingerbreadths above the sternal notch, a verticle incision was created with a scalpel after local infiltration with 0.2% Lidocaine. Then, using Seldinger technique and a percutaneous tracheostomy set, the trachea was entered with a 14 gauge needle with an overlying sheath. This was all confirmed under direct visualization of a fiberoptic flexible bronchoscope. Entrance into the trachea was identified through the third tracheal ring interspace. Following this, a guidewire was inserted. The needle was removed, leaving the sheath and the guidewire intact. Next, the sheath was removed and a small dilator was inserted. The tracheal rings were then dilated. A #6 Shiley was then opened. The balloon was checked. It was placed over a tracheal dilator, which was then advanced over the guidewire and through the previously dilated tract. The Shiley tracheostomy tube was noted to pass in the trachea with little resistance. The guidewire and dilator tubes were removed from the trachea. An inner cannula was  placed through the tracheostomy tube. The tracheostomy was then secured at the anterior neck with 4 monofilament sutures. The oral endotracheal tube was removed and the ventilator was attached to the newly placed tracheostomy tube. Adequate tidal volumes were noted. The cuff was inflated and no evidence of air leak was noted. No evidence of bleeding was noted. At this point, the procedure was concluded. Post-procedure chest x-ray was ordered.  Complications:  No immediate complications were noted.  Hemodynamic parameters and oxygenation remained stable throughout the procedure.  Estimated blood loss:  Less then 10 mL. likley some mild thyroid irritation, self resolved  Raylene Miyamoto., MD Pulmonary and [place] Pager: [phone]  10/07/2015, 8:59 AM   Should follow up with [place] [facility] [phone]

## 2015-10-07 NOTE — Progress Notes (Signed)
ANTICOAGULATION CONSULT NOTE - Follow Up Consult  Pharmacy Consult for Heparin Indication: atrial fibrillation  Patient Measurements: Height: 5\' 4"  (162.6 cm) Weight: 176 lb 2.4 oz (79.9 kg) IBW/kg (Calculated) : 54.7 Heparin Dosing Weight: 75 kg  Vital Signs: Temp: 97.3 F (36.3 C) (09/20 1000) Temp Source: Oral (09/20 1000) BP: 110/74 (09/20 1000) Pulse Rate: 68 (09/20 1000)  Labs:  Recent Labs  10/04/15 1616  10/05/15 0515  10/06/15 0402 10/06/15 0403 10/06/15 1600 10/07/15 0431 10/07/15 0434  HGB  --   < > 9.1*  --   --  8.5*  --  8.7*  --   HCT  --   --  30.9*  --   --  28.5*  --  29.4*  --   PLT  --   --  197  --   --  131*  --  135*  --   APTT 94*  --  72*  --  65*  --   --   --   --   LABPROT  --   --  15.2  --  15.2  --   --   --   --   INR  --   --  1.19  --  1.19  --   --   --   --   HEPARINUNFRC  --   --  0.51  --  0.32  --   --   --   --   CREATININE  --   --  1.08*  < >  --  0.94 0.74  --  0.95  < > = values in this interval not displayed.  Estimated Creatinine Clearance: 51.5 mL/min (by C-G formula based on SCr of 0.95 mg/dL).  Medications: Heparin @ 1150 units/hr  Assessment: 15 yof on Xarelto PTA for atrial fibrillation (last dose 9/10) now changed to IV heparin in setting of acute shock liver and CRRT. AST/ALT trending down, INR trending down. HL and aPTT correlating. Will transition to monitoring heparin levels only. Heparin was held 0500 on 9/20 for tracheostomy - to resume at 1300 same day. CBC stable. No bleeding noted.  Goal of Therapy:  Heparin level 0.3-0.7 APTT 66-90 seconds Monitor platelets by anticoagulation protocol: Yes   Plan:  1) Restart heparin at 1150 units/hr 2) Check daily heparin level 3) Monitor CBC, S/Sx bleeding daily   Melburn Popper, PharmD Clinical Pharmacy Resident Pager: [phone] 10/07/15 11:19 AM

## 2015-10-07 NOTE — Procedures (Signed)
Bedside Tracheostomy Insertion Procedure Note   Patient Details:   Name: Cristina [name] DOB: 01/07/1941 MRN: [id number]  Procedure: Tracheostomy  Pre Procedure Assessment: ET Tube Size:7.5 ET Tube secured at lip (cm):22  Bite block in place: No Breath Sounds: Clear  Post Procedure Assessment: BP (!) 104/54   Pulse 77   Temp 97.7 F (36.5 C)   Resp 16   Ht 5\' 4"  (1.626 m)   Wt 176 lb 2.4 oz (79.9 kg)   SpO2 95%   BMI 30.24 kg/m  O2 sats: stable throughout Complications: No apparent complications Patient did tolerate procedure well Tracheostomy Brand:Shiley Tracheostomy Style:Cuffed Tracheostomy Size: 6.0 Tracheostomy Secured [id number] Tracheostomy Placement Confirmation:Trach cuff visualized and in place and Chest X ray ordered for placement    Phillis Knack [facility] 10/07/2015, 9:04 AM

## 2015-10-08 ENCOUNTER — Inpatient Hospital Stay (HOSPITAL_COMMUNITY): Payer: Medicare Other

## 2015-10-08 LAB — GLUCOSE, CAPILLARY
GLUCOSE-CAPILLARY: 108 mg/dL — AB (ref 65–99)
GLUCOSE-CAPILLARY: 122 mg/dL — AB (ref 65–99)
GLUCOSE-CAPILLARY: 139 mg/dL — AB (ref 65–99)
Glucose-Capillary: 108 mg/dL — ABNORMAL HIGH (ref 65–99)
Glucose-Capillary: 111 mg/dL — ABNORMAL HIGH (ref 65–99)
Glucose-Capillary: 119 mg/dL — ABNORMAL HIGH (ref 65–99)
Glucose-Capillary: 125 mg/dL — ABNORMAL HIGH (ref 65–99)

## 2015-10-08 LAB — COMPREHENSIVE METABOLIC PANEL
ALBUMIN: 2.8 g/dL — AB (ref 3.5–5.0)
ALT: 92 U/L — ABNORMAL HIGH (ref 14–54)
ANION GAP: 13 (ref 5–15)
AST: 20 U/L (ref 15–41)
Alkaline Phosphatase: 109 U/L (ref 38–126)
BUN: 48 mg/dL — AB (ref 6–20)
CHLORIDE: 105 mmol/L (ref 101–111)
CO2: 20 mmol/L — AB (ref 22–32)
Calcium: 9.2 mg/dL (ref 8.9–10.3)
Creatinine, Ser: 1.62 mg/dL — ABNORMAL HIGH (ref 0.44–1.00)
GFR calc Af Amer: 34 mL/min — ABNORMAL LOW (ref >60)
GFR calc non Af Amer: 30 mL/min — ABNORMAL LOW (ref >60)
GLUCOSE: 123 mg/dL — AB (ref 65–99)
POTASSIUM: 4.4 mmol/L (ref 3.5–5.1)
SODIUM: 138 mmol/L (ref 135–145)
Total Bilirubin: 0.8 mg/dL (ref 0.3–1.2)
Total Protein: 5.1 g/dL — ABNORMAL LOW (ref 6.5–8.1)

## 2015-10-08 LAB — HEPARIN LEVEL (UNFRACTIONATED)
HEPARIN UNFRACTIONATED: 0.21 [IU]/mL — AB (ref 0.30–0.70)
Heparin Unfractionated: 0.2 IU/mL — ABNORMAL LOW (ref 0.30–0.70)

## 2015-10-08 LAB — CBC
HCT: 27 % — ABNORMAL LOW (ref 36.0–46.0)
Hemoglobin: 8.1 g/dL — ABNORMAL LOW (ref 12.0–15.0)
MCH: 27.4 pg (ref 26.0–34.0)
MCHC: 30 g/dL (ref 30.0–36.0)
MCV: 91.2 fL (ref 78.0–100.0)
Platelets: 184 10*3/uL (ref 150–400)
RBC: 2.96 MIL/uL — ABNORMAL LOW (ref 3.87–5.11)
RDW: 17.2 % — AB (ref 11.5–15.5)
WBC: 13.4 10*3/uL — AB (ref 4.0–10.5)

## 2015-10-08 LAB — PHOSPHORUS: Phosphorus: 1.9 mg/dL — ABNORMAL LOW (ref 2.5–4.6)

## 2015-10-08 LAB — MAGNESIUM: MAGNESIUM: 2.4 mg/dL (ref 1.7–2.4)

## 2015-10-08 MED ORDER — WARFARIN - PHARMACIST DOSING INPATIENT
Freq: Every day | Status: DC
  Administered 2015-10-10 – 2015-10-12 (×3)

## 2015-10-08 MED ORDER — DARBEPOETIN ALFA 100 MCG/0.5ML IJ SOSY
100.0000 ug | PREFILLED_SYRINGE | INTRAMUSCULAR | Status: DC
  Administered 2015-10-08: 100 ug via SUBCUTANEOUS
  Filled 2015-10-08 (×2): qty 0.5

## 2015-10-08 MED ORDER — FENTANYL CITRATE (PF) 100 MCG/2ML IJ SOLN
12.5000 ug | INTRAMUSCULAR | Status: DC | PRN
  Administered 2015-10-08 – 2015-10-09 (×2): 12.5 ug via INTRAVENOUS
  Filled 2015-10-08 (×2): qty 2

## 2015-10-08 MED ORDER — ALPRAZOLAM 0.25 MG PO TABS
0.2500 mg | ORAL_TABLET | Freq: Two times a day (BID) | ORAL | Status: DC | PRN
  Administered 2015-10-08 – 2015-10-12 (×5): 0.25 mg
  Filled 2015-10-08 (×5): qty 1

## 2015-10-08 MED ORDER — SODIUM CHLORIDE 0.9 % IV SOLN
510.0000 mg | Freq: Once | INTRAVENOUS | Status: AC
  Administered 2015-10-08: 510 mg via INTRAVENOUS
  Filled 2015-10-08 (×2): qty 17

## 2015-10-08 MED ORDER — WARFARIN SODIUM 5 MG PO TABS
5.0000 mg | ORAL_TABLET | Freq: Once | ORAL | Status: AC
  Administered 2015-10-08: 5 mg via ORAL
  Filled 2015-10-08: qty 1

## 2015-10-08 NOTE — Progress Notes (Signed)
ANTICOAGULATION CONSULT NOTE  Pharmacy Consult for Heparin Indication: atrial fibrillation  Patient Measurements: Height: 5\' 4"  (162.6 cm) Weight: 172 lb 2.9 oz (78.1 kg) IBW/kg (Calculated) : 54.7 Heparin Dosing Weight: 75 kg  Vital Signs: Temp: 98.3 F (36.8 C) (09/20 2311) Temp Source: Axillary (09/20 2311) BP: 107/57 (09/21 0000) Pulse Rate: 85 (09/21 0421)  Labs:  Recent Labs  10/06/15 0402  10/06/15 0403  10/07/15 0431 10/07/15 0434 10/07/15 1629 10/07/15 1741 10/08/15 0425  HGB  --   < > 8.5*  --  8.7*  --   --   --  8.1*  HCT  --   --  28.5*  --  29.4*  --   --   --  27.0*  PLT  --   --  131*  --  135*  --   --   --  184  APTT 65*  --   --   --   --   --   --   --   --   LABPROT 15.2  --   --   --   --   --   --   --   --   INR 1.19  --   --   --   --   --   --   --   --   HEPARINUNFRC 0.32  --   --   --   --   --   --   --  0.20*  CREATININE  --   --  0.94  < >  --  0.95 3.34* 1.00  --   < > = values in this interval not displayed.  Estimated Creatinine Clearance: 48.4 mL/min (by C-G formula based on SCr of 1 mg/dL).  Assessment: 75 y.o. female with h/o Afib, Xarelto on hold, for heparin  Goal of Therapy:  Heparin level 0.3-0.7 Monitor platelets by anticoagulation protocol: Yes   Plan:  Increase Heparin 1300 units/hr Check heparin level in 8 hours.   Phillis Knack, PharmD, BCPS  10/08/15 5:21 AM

## 2015-10-08 NOTE — Progress Notes (Signed)
[name] Kitchen ANTICOAGULATION CONSULT NOTE - Initial Consult  Pharmacy Consult for Coumadin Indication: atrial fibrillation  Allergies  Allergen Reactions  . Augmentin [Amoxicillin-Pot Clavulanate] Anaphylaxis and Other (See Comments)    Has patient had a PCN reaction causing immediate rash, facial/tongue/throat swelling, SOB or lightheadedness with hypotension: Yes Has patient had a PCN reaction causing severe rash involving mucus membranes or skin necrosis: No Has patient had a PCN reaction that required hospitalization No Has patient had a PCN reaction occurring within the last 10 years: Yes If all of the above answers are "NO", then may proceed with Cephalosporin use.  [name] Kitchen Lisinopril Anaphylaxis and Cough    Patient Measurements: Height: 5\' 4"  (162.6 cm) Weight: 172 lb 2.9 oz (78.1 kg) IBW/kg (Calculated) : 54.7 Heparin Dosing Weight: N/A  Vital Signs: Temp: 100.6 F (38.1 C) (09/21 0800) Temp Source: Oral (09/21 0800) BP: 102/59 (09/21 0800) Pulse Rate: 94 (09/21 0800)  Labs:  Recent Labs  10/06/15 0402  10/06/15 0403  10/07/15 0431  10/07/15 1629 10/07/15 1741 10/08/15 0425  HGB  --   < > 8.5*  --  8.7*  --   --   --  8.1*  HCT  --   --  28.5*  --  29.4*  --   --   --  27.0*  PLT  --   --  131*  --  135*  --   --   --  184  APTT 65*  --   --   --   --   --   --   --   --   LABPROT 15.2  --   --   --   --   --   --   --   --   INR 1.19  --   --   --   --   --   --   --   --   HEPARINUNFRC 0.32  --   --   --   --   --   --   --  0.20*  CREATININE  --   --  0.94  < >  --   < > 3.34* 1.00 1.62*  < > = values in this interval not displayed.  Estimated Creatinine Clearance: 29.9 mL/min (by C-G formula based on SCr of 1.62 mg/dL (H)).   Medical History: Past Medical History:  Diagnosis Date  . Anxiety   . Asthma   . Atrial fibrillation ([place])   . Dysrhythmia    PAF  . History of gout   . Hyperlipemia   . Hypertension   . Pneumonia ~ 2012  . Shortness of breath dyspnea     with exertion    Medications:  . feeding supplement (VITAL HIGH PROTEIN) 1,000 mL (10/08/15 0700)  . heparin 1,300 Units/hr (10/08/15 0700)    Assessment: 50 yoF on IV heparin in setting of acute shock liver and CRRT. Elevated ALT 92, but trending down, AST wnl. Baseline INR 1.19 on 9/19. Pt to transition to Coumadin. CBC stable. No bleeding noted. Pt was on Xarelto PTA for AF. Last dose 9/10.  Goal of Therapy:  INR 2-3   Plan:  1. Initiate Coumadin 5 mg po x 1. 2. Continue Heparin 1300 Units/hr until INR therapeutic x 2 3. Monitor INR daily 4. Monitor CBC, S/Sx bleeding daily  Cristina [name] 10/08/2015,11:13 AM  I discussed / reviewed the pharmacy note by Cristina [name], Pharm D Candidate and I agree with the resident's findings  and plans as documented.  Uvaldo Rising, BCPS  Clinical Pharmacist Pager [phone]  10/08/2015 11:39 AM

## 2015-10-08 NOTE — Progress Notes (Signed)
Patient ID: Cristina [name], female   DOB: 07/08/1940, 75 y.o.   MRN: [id number]  [facility] Progress Note   Subjective:    Stopped CRRT yesterday afternoon (9/20) Has made [name] small amount of urine that is clearer  Trached yesterday Pressors currently off    Objective:   BP (!) 102/59 (BP Location: Right Leg)   Pulse 94   Temp (!) 100.6 F (38.1 C) (Oral)   Resp 14   Ht 5\' 4"  (1.626 m)   Wt 78.1 kg (172 lb 2.9 oz)   SpO2 100%   BMI 29.55 kg/m   Intake/Output Summary (Last 24 hours) at 10/08/15 0928 Last data filed at 10/08/15 0700  Gross per 24 hour  Intake          1007.58 ml  Output             1006 ml  Net             1.58 ml    Weight trending            10/08/15  78.1 kg   10/07/15  79.9 kg CRRT discontinued  10/06/15 0406  81.5 kg (179 lb 10.8 oz)   10/05/15 0500  83.4 kg (183 lb 13.8 oz)   10/04/15 0400  84 kg (185 lb 3 oz)   10/03/15 0407  83.5 kg (184 lb 1.4 oz)   10/02/15 0341  86.8 kg (191 lb 5.8 oz)   10/01/15 0400  91.4 kg (201 lb 8 oz)   09/30/15 0400  93.3 kg (205 lb 11 oz)   09/29/15 0500  92.7 kg (204 lb 5.9 oz) CRRT initiated  09/28/15 0500  90.2 kg (198 lb 13.7 oz)    09/24/15 0935  81.2 kg (179 lb     Physical Exam: CVP 6 Trached and on vent Grimaces and looks uncomfortable Lines R IJ HD cath (9/12), left IJ TLC Pulse irregular irreg, S1 and S2 normal Clear anteriorly Soft, obese, not distended, grimaces with exam 1+ edema both LE's and UE's with pitting edema to the thighs that is persistent Urine in foley lighter yellow   Imaging: Dg Chest Port 1 View  Result Date: 10/08/2015 CLINICAL DATA:  Respiratory failure. EXAM: PORTABLE CHEST 1 VIEW COMPARISON:  10/07/2015 FINDINGS: Tracheostomy tube, right IJ central line, left IJ central line, and nasogastric tube are in place as before. The heart is enlarged. There is persistent dense opacity at the left lung base which obscures the hemidiaphragm. There has been  improvement in aeration at the right lung base. No evidence for pulmonary edema. IMPRESSION: Improvement in aeration of the right lung base. Persistent significant opacity in the left lower lobe. Electronically Signed   By: Nolon Nations M.D.   On: 10/08/2015 08:19   Dg Chest Port 1 View  Result Date: 10/07/2015 CLINICAL DATA:  Tracheostomy placement EXAM: PORTABLE CHEST 1 VIEW COMPARISON:  October 06, 2015 FINDINGS: There is no [name] tracheostomy catheter present with the tip 4.9 cm above the carina. Central catheter tips are in the superior vena cava. Nasogastric tube tip and side port are below the diaphragm. No pneumothorax. There is focal airspace consolidation in the left base. There are small pleural effusions bilaterally. There is mild right base atelectasis. Heart is mildly enlarged with mild pulmonary venous hypertension. There is atherosclerotic calcification in the aortic arch. No adenopathy evident. There is advanced erosion of the left humeral head, stable. There is calcification in the left carotid artery.  IMPRESSION: Tube and catheter positions as described. No tracheostomy present. No pneumothorax. Left lower lobe airspace consolidation, likely pneumonia, present. There is [name] degree of underlying pulmonary vascular congestion. Small pleural effusions bilaterally noted. There may well be [name] degree of frank congestive heart failure superimposed. There is aortic atherosclerosis. There is left carotid artery calcification. Advanced erosion of the left humeral head noted. Electronically Signed   By: Lowella Grip III M.D.   On: 10/07/2015 09:47   Dg Abd Portable 1v  Result Date: 10/07/2015 CLINICAL DATA:  Feeding tube placement EXAM: PORTABLE ABDOMEN - 1 VIEW COMPARISON:  None. FINDINGS: Feeding tube tip is in distal stomach. Bowel gas pattern is normal. No bowel obstruction or free air evident. There is moderate stool in the colon. There is consolidation in the left lung base. IMPRESSION:  Feeding tube tip in distal stomach. Bowel gas pattern unremarkable. Left base airspace consolidation. Electronically Signed   By: Lowella Grip III M.D.   On: 10/07/2015 09:48   Labs:  Recent Labs Lab 10/05/15 1634 10/06/15 0403 10/06/15 1600 10/07/15 0434 10/07/15 1629 10/07/15 1741 10/08/15 0425  NA 136 137 138 138 140 139 138  K 3.8 4.2 4.2 4.2 3.8 4.2 4.4  CL 102 102 101 103 106 103 105  CO2 29 29 29 28 23 28  20*  GLUCOSE 135* 119* 129* 113* 160* 123* 123*  BUN 21* 20 18 22* 85* 24* 48*  CREATININE 1.03* 0.94 0.74 0.95 3.34* 1.00 1.62*  CALCIUM 8.8* 9.3 9.1 9.4 7.8* 9.2 9.2  PHOS 1.6* 2.1* 1.3* 1.7* 5.8* 2.3* 1.9*     Recent Labs Lab 10/05/15 0515 10/06/15 0403 10/07/15 0431 10/08/15 0425  WBC 17.7* 15.4* 17.0* 13.4*  HGB 9.1* 8.5* 8.7* 8.1*  HCT 30.9* 28.5* 29.4* 27.0*  MCV 93.1 91.9 92.2 91.2  PLT 197 131* 135* 184   Results for Cristina [name], Cristina [name] (MRN [id number]) as of 10/06/2015 08:56  Ref. Range 10/06/2015 04:03  Alkaline Phosphatase Latest Ref Range: 38 - 126 U/L 109  Albumin Latest Ref Range: 3.5 - 5.0 g/dL 4.0  AST Latest Ref Range: 15 - 41 U/L 25  ALT Latest Ref Range: 14 - 54 U/L 181 (H)  Total Protein Latest Ref Range: 6.5 - 8.1 g/dL 6.5  Bilirubin, Direct Latest Ref Range: 0.1 - 0.5 mg/dL 0.8 (H)  Indirect Bilirubin Latest Ref Range: 0.3 - 0.9 mg/dL 1.2 (H)  Total Bilirubin Latest Ref Range: 0.3 - 1.2 mg/dL 2.0 (H)   Iron/TIBC/Ferritin/ %Sat    Component Value Date/Time   IRON 49 10/01/2015 0830   TIBC 280 10/01/2015 0830   FERRITIN 1,292 (H) 10/01/2015 0830   IRONPCTSAT 18 10/01/2015 0830   Medications:    . arformoterol  15 mcg Nebulization BID  . aspirin  81 mg Oral Daily  . budesonide (PULMICORT) nebulizer solution  0.5 mg Nebulization BID  . chlorhexidine  15 mL Mouth Rinse BID  . citalopram  20 mg Per Tube QHS  . feeding supplement (PRO-STAT SUGAR FREE 64)  60 mL Per Tube BID  . insulin aspart  0-20 Units Subcutaneous Q4H  .  mouth rinse  15 mL Mouth Rinse 10 times per day  . pantoprazole sodium  40 mg Per Tube Daily  . pramipexole  2 mg Oral QHS  . sodium chloride  250 mL Intravenous Once  . Vitamin D (Ergocalciferol)  50,000 Units Oral Q7 days   Infusions . feeding supplement (VITAL HIGH PROTEIN) 1,000 mL (10/08/15 0700)  . heparin  1,300 Units/hr (10/08/15 0700)   Background: 75 yo female with AF, HTN, HLD, gut, asthma - s/p fall with R distal humerus fx, repaired on 09/24/15. Developed bradycardia, hypotension, progressive renal failure >>likely from PNA, and medications (beta blocker/calcium channel blocker). Resultant hypoxemia respiratory failure, shock liver. Baseline creatinine around 0.7. CRRT 09/29/15-10/07/15  for anuric renal failure, volume overload.(catheter placed 9/12 by CCM)  Assessment/ Plan:    Acute kidney injury: Ischemic ATN secondary to profound/sustained hypotension/PNA.  Dialysis dependent since 09/29/15  (CRRT 9/12 - 9/20) CXR no edema, still with LE edema to thighs despite drop in vol from 92/7->78 kg. Making small amt urine No indication for hemo today Re-eval in the AM  Hypophosphatemia Repleted yesterday Now that off CRRT hypophos not likely to be recurrent issue  Hypoxic respiratory failure/sepsis/pneumonia: Finished levaquin for LLL PNA S/p trach 9/20  S/p R humerus fx - op 09/24/15 Open treatment internal fixation of right supracondylar humerus fracture with intracondylar extension Olecranon osteotomy and ORIF of olecranon with internal fixation Subcutaneous transposition of ulnar nerve at elbow  Elevated LFTs/coagulopathy/shock liver Resolved  Anemia: Hb trending down Fe stores low Now that infection treated, can give IV feraheme and will also dose with Aranesp   Jamal Maes, MD [place] Pager 10/08/2015, 9:28 AM

## 2015-10-08 NOTE — Progress Notes (Signed)
[name] Kitchen ANTICOAGULATION CONSULT NOTE - Initial Consult  Pharmacy Consult for heparin/Coumadin Indication: atrial fibrillation  Allergies  Allergen Reactions  . Augmentin [Amoxicillin-Pot Clavulanate] Anaphylaxis and Other (See Comments)    Has patient had a PCN reaction causing immediate rash, facial/tongue/throat swelling, SOB or lightheadedness with hypotension: Yes Has patient had a PCN reaction causing severe rash involving mucus membranes or skin necrosis: No Has patient had a PCN reaction that required hospitalization No Has patient had a PCN reaction occurring within the last 10 years: Yes If all of the above answers are "NO", then may proceed with Cephalosporin use.  [name] Kitchen Lisinopril Anaphylaxis and Cough    Patient Measurements: Height: 5\' 4"  (162.6 cm) Weight: 172 lb 2.9 oz (78.1 kg) IBW/kg (Calculated) : 54.7 Heparin Dosing Weight: 76  Vital Signs: Temp: 100.7 F (38.2 C) (09/21 1210) Temp Source: Oral (09/21 1210) BP: 99/58 (09/21 1500) Pulse Rate: 129 (09/21 1500)  Labs:  Recent Labs  10/06/15 0402  10/06/15 0403  10/07/15 0431  10/07/15 1629 10/07/15 1741 10/08/15 0425 10/08/15 1400  HGB  --   < > 8.5*  --  8.7*  --   --   --  8.1*  --   HCT  --   --  28.5*  --  29.4*  --   --   --  27.0*  --   PLT  --   --  131*  --  135*  --   --   --  184  --   APTT 65*  --   --   --   --   --   --   --   --   --   LABPROT 15.2  --   --   --   --   --   --   --   --   --   INR 1.19  --   --   --   --   --   --   --   --   --   HEPARINUNFRC 0.32  --   --   --   --   --   --   --  0.20* 0.21*  CREATININE  --   --  0.94  < >  --   < > 3.34* 1.00 1.62*  --   < > = values in this interval not displayed.  Estimated Creatinine Clearance: 29.9 mL/min (by C-G formula based on SCr of 1.62 mg/dL (H)).   Medical History: Past Medical History:  Diagnosis Date  . Anxiety   . Asthma   . Atrial fibrillation ([place])   . Dysrhythmia    PAF  . History of gout   . Hyperlipemia   .  Hypertension   . Pneumonia ~ 2012  . Shortness of breath dyspnea    with exertion    Medications:  . feeding supplement (VITAL HIGH PROTEIN) 1,000 mL (10/08/15 1500)  . heparin 1,300 Units/hr (10/08/15 1500)    Assessment: 36 yoF on IV heparin in setting of acute shock liver and CRRT. Elevated ALT 92, but trending down, AST wnl. Baseline INR 1.19 on 9/19. Pt to transition to Coumadin. CBC stable. No bleeding noted. Pt was on Xarelto PTA for AF. Last dose 9/10.  Heparin level 0.21, subtherapeutic after rate increased to 1300 units/hr. No interruption with infusion, no issues with lines.  Goal of Therapy:  INR 2-3 Heparin level 0.3-0.7   Plan:  - Increase Heparin rate to 1450 Units/hr  -  f/u heparin level at [place], PharmD, BCPS  Clinical Pharmacist  Pager: [phone]   10/08/2015,4:09 PM

## 2015-10-08 NOTE — Progress Notes (Signed)
PULMONARY / CRITICAL CARE MEDICINE   Name: Cristina [name] MRN: [id number] DOB: 1940-06-03    ADMISSION DATE:  09/24/2015 CONSULTATION DATE:  09/27/15  REFERRING MD: Dr. Erlinda Hong  CHIEF COMPLAINT:  Fall   SUBJECTIVE:  Sedated on vent   VITAL SIGNS: BP (!) 102/59 (BP Location: Right Leg)   Pulse 94   Temp (!) 100.6 F (38.1 C) (Oral)   Resp 14   Ht 5\' 4"  (1.626 m)   Wt 172 lb 2.9 oz (78.1 kg)   SpO2 100%   BMI 29.55 kg/m   HEMODYNAMICS: CVP:  [6 mmHg-7 mmHg] 6 mmHg  VENTILATOR SETTINGS: Vent Mode: PRVC FiO2 (%):  [30 %-40 %] 30 % Set Rate:  [16 bmp-20 bmp] 16 bmp Vt Set:  [500 mL] 500 mL PEEP:  [5 cmH20] 5 cmH20 Plateau Pressure:  [15 cmH20-18 cmH20] 16 cmH20  INTAKE / OUTPUT: I/O last 3 completed shifts: In: 1603.8 [I.V.:746.3; NG/GT:647.5; IV [id number] Out: 2217 [Urine:121; Other:2096]  PHYSICAL EXAMINATION: General: sedated Neuro: RASS -3, wakes up agitated at times. No focal def HEENT: #6 trach unremarkable  Cardiac: irregular Chest: no wheeze, decreased left base equal chest rise  Abd: soft, non tender Ext: 1+ edema, Rt arm in wrap Skin: no rashes  LABS:  BMET  Recent Labs Lab 10/07/15 1629 10/07/15 1741 10/08/15 0425  NA 140 139 138  K 3.8 4.2 4.4  CL 106 103 105  CO2 23 28 20*  BUN 85* 24* 48*  CREATININE 3.34* 1.00 1.62*  GLUCOSE 160* 123* 123*   Electrolytes  Recent Labs Lab 10/06/15 0403  10/07/15 0431  10/07/15 1629 10/07/15 1741 10/08/15 0425  CALCIUM 9.3  < >  --   < > 7.8* 9.2 9.2  MG 2.4  --  2.6*  --   --   --  2.4  PHOS 2.1*  < >  --   < > 5.8* 2.3* 1.9*  < > = values in this interval not displayed.   CBC  Recent Labs Lab 10/06/15 0403 10/07/15 0431 10/08/15 0425  WBC 15.4* 17.0* 13.4*  HGB 8.5* 8.7* 8.1*  HCT 28.5* 29.4* 27.0*  PLT 131* 135* 184   Coag's  Recent Labs Lab 10/04/15 0750 10/04/15 1616 10/05/15 0515 10/06/15 0402  APTT 95* 94* 72* 65*  INR 1.32  --  1.19 1.19    Sepsis Markers No  results for input(s): LATICACIDVEN, PROCALCITON, O2SATVEN in the last 168 hours.  ABG No results for input(s): PHART, PCO2ART, PO2ART in the last 168 hours. Liver Enzymes  Recent Labs Lab 10/03/15 0500  10/06/15 0403  10/07/15 1629 10/07/15 1741 10/08/15 0425  AST 83*  --  25  --   --   --  20  ALT 674*  --  181*  --   --   --  92*  ALKPHOS 173*  --  109  --   --   --  109  BILITOT 1.4*  --  2.0*  --   --   --  0.8  ALBUMIN 3.0*  2.9*  < > 4.0  4.0  < > 1.9* 3.2* 2.8*  < > = values in this interval not displayed.   Cardiac Enzymes No results for input(s): TROPONINI, PROBNP in the last 168 hours.  Glucose  Recent Labs Lab 10/07/15 1147 10/07/15 1609 10/07/15 1948 10/07/15 2355 10/08/15 0337 10/08/15 0801  GLUCAP 99 100* 115* 108* 125* 119*   Imaging Dg Chest Port 1 View  Result Date: 10/08/2015  CLINICAL DATA:  Respiratory failure. EXAM: PORTABLE CHEST 1 VIEW COMPARISON:  10/07/2015 FINDINGS: Tracheostomy tube, right IJ central line, left IJ central line, and nasogastric tube are in place as before. The heart is enlarged. There is persistent dense opacity at the left lung base which obscures the hemidiaphragm. There has been improvement in aeration at the right lung base. No evidence for pulmonary edema. IMPRESSION: Improvement in aeration of the right lung base. Persistent significant opacity in the left lower lobe. Electronically Signed   By: Nolon Nations M.D.   On: 10/08/2015 08:19   Dg Chest Port 1 View  Result Date: 10/07/2015 CLINICAL DATA:  Tracheostomy placement EXAM: PORTABLE CHEST 1 VIEW COMPARISON:  October 06, 2015 FINDINGS: There is no a tracheostomy catheter present with the tip 4.9 cm above the carina. Central catheter tips are in the superior vena cava. Nasogastric tube tip and side port are below the diaphragm. No pneumothorax. There is focal airspace consolidation in the left base. There are small pleural effusions bilaterally. There is mild right  base atelectasis. Heart is mildly enlarged with mild pulmonary venous hypertension. There is atherosclerotic calcification in the aortic arch. No adenopathy evident. There is advanced erosion of the left humeral head, stable. There is calcification in the left carotid artery. IMPRESSION: Tube and catheter positions as described. No tracheostomy present. No pneumothorax. Left lower lobe airspace consolidation, likely pneumonia, present. There is a degree of underlying pulmonary vascular congestion. Small pleural effusions bilaterally noted. There may well be a degree of frank congestive heart failure superimposed. There is aortic atherosclerosis. There is left carotid artery calcification. Advanced erosion of the left humeral head noted. Electronically Signed   By: Lowella Grip III M.D.   On: 10/07/2015 09:47   Dg Abd Portable 1v  Result Date: 10/07/2015 CLINICAL DATA:  Feeding tube placement EXAM: PORTABLE ABDOMEN - 1 VIEW COMPARISON:  None. FINDINGS: Feeding tube tip is in distal stomach. Bowel gas pattern is normal. No bowel obstruction or free air evident. There is moderate stool in the colon. There is consolidation in the left lung base. IMPRESSION: Feeding tube tip in distal stomach. Bowel gas pattern unremarkable. Left base airspace consolidation. Electronically Signed   By: Lowella Grip III M.D.   On: 10/07/2015 09:48  right side changes have improved, still w/ LLL atx/ asd  STUDIES:  2D echo 9/10 >> EF [id number], mod MR, systolic function moderately reduced, PA peak pressure 30mm  CULTURES: Blood 9/10 >negative Sputum 9/10 > normal flora  MRSA 9/10 >negative  ANTIBIOTICS: Levaquin 9/10 > 9/16 Vancomycin 9/10 > 9/13  SIGNIFICANT EVENTS: 9/07 admit after fall. Had repair of right shoulder surgery. 9/10 PCCM consulted for hypotension, bradycardia. Intubated for airway.  Xarelto stopped. 9/12 Renal consulted, start CRRT 9/19 Family meeting >> limited resuscitation; off  pressors 9/20 trach 9/21 stopped precedex  LINES/TUBES: L IJ CVL 9/10>>> ETT 9/11>>>9/20 Trach 6 cuffed (DF) 9/20>>> R IJ HD catheter 9/12>>>  DISCUSSION: 75 yo female had fall and developed Rt distal humerus fx.  Had repair on 9/07.  Developed bradycardia, hypotension, progressive renal failure >> likely from PNA, and medications (beta blocker/calcium channel blocker). Now s/p prolonged critical illness w/ weaning efforts c/b delirium, severe malnutrition & deconditioning. Her trach was placed 9/20. Will stop precedex. Hope to begin weaning trials later today. Could probably look at [facility] placement early next week if things cont to improve.   PMHx of A fib on xarelto, HTN, HLD, Gout, Asthma, Anxiety.   ASSESSMENT /  PLAN:  PULMONARY A: Acute hypoxic respiratory failure 2nd to HCAP, acute pulmonary edema. Hx of asthma. ->failure to wean d/t acute encephalopathy  P:   Re-assess for ATC and PSV when MS allows.  F/u CXR Continue pulmicort. brovana, prn duoneb  CARDIOVASCULAR A:  Shock 2nd to sepsis from PNA and bradycardia from beta blocker/calcium channel blocker-->resolved Chronic A fib. Elevated troponin 2nd to demand ischemia. Moderate mitral regurgitation. Hx of HTN, HLD. P:  Heparin gtt per pharmacy   Start coumadin per pharmacy  Hold outpt lipitor due to elevated LFT's >> might be able to resume soon Avoiding amiodarone use due to elevated LFT's >> could use if needed as LFT's improve Hold lopressor while on levophed-->we can look at resuming this soon Nuclear stress test once recovered  RENAL A:   AKI 2nd to ischemia >> baseline creatinine 0.77 from 08/19/15. P:   Cont IHD per renal   GASTROINTESTINAL A:   Shock liver 2nd to ischemia - improving slowly.  Nutrition. P:   Tube feeds Protonix for SUP F/u LFTs intermittently   HEMATOLOGIC A:   Anemia of critical illness. Mild thrombocytopenia. Coagulopathy - r/t shock liver >> resolved. P:  F/u CBC Cont  heparin gtt  INFECTIOUS A:   Sepsis 2nd to HCAP >> completed Abx 9/16. Low grade fever  P:   Monitor off Abx Trend wbc and fever curve    ENDOCRINE A:   Relative adrenal insufficiency - cortisol 14.3 from 09/28/15 >> off solu cortef. Hyperglycemia. P:   SSI while on tube feeds  NEUROLOGIC A:   Acute metabolic encephalopathy. Hx of anxiety >> this is contributing to difficulty with vent weaning. Hx of RLS. -->she is over sedated on precedex but wakes up intermittently "panicked" P:   RASS goal: 0 to -1 Dc precedex Cont low dose PRN fent Continue celexa, mirapex Add 1/2 dosing of her home xanax  ORTHOPEDICS A: Rt humerus fracture. P: Per ortho  GOALS OF CARE A: Limited resuscitation. P: Family okay with trach and short term vent support Would not want SNF with long term HD and vent support No CPR/defibrillation  Updated pt's daughter at bedside.  Erick Colace ACNP-BC [place] Pager # [phone] OR # [phone] if no answer  Sedated.  Off CRRT.  Trach site clean.  No wheeze.  HR regular.  Abd soft.  Creatinine 1.62, Hb 8.1.  CXR - decreased ATX  Assessment/plan:  Acute respiratory failure with failure to wean s/p trach. - limit sedation and push pressure support  AKI. - monitor of CRRT  A fib. - transition from heparin to coumadin  Updated pt's son-in-law at bedside.  [name], MD [facility] Pulmonary/Critical Care 10/08/2015, 10:43 AM Pager:  [phone] After 3pm call: [phone]

## 2015-10-09 LAB — RENAL FUNCTION PANEL
ALBUMIN: 2.6 g/dL — AB (ref 3.5–5.0)
ANION GAP: 9 (ref 5–15)
Albumin: 2.8 g/dL — ABNORMAL LOW (ref 3.5–5.0)
Anion gap: 8 (ref 5–15)
BUN: 81 mg/dL — ABNORMAL HIGH (ref 6–20)
BUN: 96 mg/dL — AB (ref 6–20)
CHLORIDE: 102 mmol/L (ref 101–111)
CHLORIDE: 104 mmol/L (ref 101–111)
CO2: 25 mmol/L (ref 22–32)
CO2: 27 mmol/L (ref 22–32)
CREATININE: 3.17 mg/dL — AB (ref 0.44–1.00)
Calcium: 9.2 mg/dL (ref 8.9–10.3)
Calcium: 9.2 mg/dL (ref 8.9–10.3)
Creatinine, Ser: 2.79 mg/dL — ABNORMAL HIGH (ref 0.44–1.00)
GFR calc Af Amer: 18 mL/min — ABNORMAL LOW (ref >60)
GFR calc non Af Amer: 13 mL/min — ABNORMAL LOW (ref >60)
GFR, EST AFRICAN AMERICAN: 15 mL/min — AB (ref >60)
GFR, EST NON AFRICAN AMERICAN: 15 mL/min — AB (ref >60)
Glucose, Bld: 123 mg/dL — ABNORMAL HIGH (ref 65–99)
Glucose, Bld: 106 mg/dL — ABNORMAL HIGH (ref 65–99)
PHOSPHORUS: 1.1 mg/dL — AB (ref 2.5–4.6)
POTASSIUM: 3.4 mmol/L — AB (ref 3.5–5.1)
Phosphorus: 3.7 mg/dL (ref 2.5–4.6)
Potassium: 3.9 mmol/L (ref 3.5–5.1)
Sodium: 136 mmol/L (ref 135–145)
Sodium: 139 mmol/L (ref 135–145)

## 2015-10-09 LAB — GLUCOSE, CAPILLARY
GLUCOSE-CAPILLARY: 127 mg/dL — AB (ref 65–99)
GLUCOSE-CAPILLARY: 106 mg/dL — AB (ref 65–99)
Glucose-Capillary: 101 mg/dL — ABNORMAL HIGH (ref 65–99)
Glucose-Capillary: 151 mg/dL — ABNORMAL HIGH (ref 65–99)
Glucose-Capillary: 113 mg/dL — ABNORMAL HIGH (ref 65–99)

## 2015-10-09 LAB — CBC
HCT: 23.8 % — ABNORMAL LOW (ref 36.0–46.0)
HEMOGLOBIN: 7.3 g/dL — AB (ref 12.0–15.0)
MCH: 27 pg (ref 26.0–34.0)
MCHC: 30.7 g/dL (ref 30.0–36.0)
MCV: 88.1 fL (ref 78.0–100.0)
Platelets: 168 10*3/uL (ref 150–400)
RBC: 2.7 MIL/uL — AB (ref 3.87–5.11)
RDW: 17.1 % — ABNORMAL HIGH (ref 11.5–15.5)
WBC: 12.7 10*3/uL — AB (ref 4.0–10.5)

## 2015-10-09 LAB — HEPARIN LEVEL (UNFRACTIONATED)
HEPARIN UNFRACTIONATED: 0.49 [IU]/mL (ref 0.30–0.70)
Heparin Unfractionated: 0.42 IU/mL (ref 0.30–0.70)

## 2015-10-09 LAB — MAGNESIUM: MAGNESIUM: 2.2 mg/dL (ref 1.7–2.4)

## 2015-10-09 LAB — PROTIME-INR
INR: 1.4
PROTHROMBIN TIME: 17.3 s — AB (ref 11.4–15.2)

## 2015-10-09 MED ORDER — FUROSEMIDE 10 MG/ML IJ SOLN
120.0000 mg | Freq: Once | INTRAMUSCULAR | Status: AC
  Administered 2015-10-09: 120 mg via INTRAVENOUS
  Filled 2015-10-09: qty 12

## 2015-10-09 MED ORDER — FENTANYL CITRATE (PF) 100 MCG/2ML IJ SOLN
50.0000 ug | INTRAMUSCULAR | Status: DC | PRN
  Administered 2015-10-10 – 2015-10-13 (×16): 50 ug via INTRAVENOUS
  Filled 2015-10-09 (×16): qty 2

## 2015-10-09 MED ORDER — SODIUM PHOSPHATES 45 MMOLE/15ML IV SOLN
30.0000 mmol | Freq: Once | INTRAVENOUS | Status: AC
  Administered 2015-10-09: 30 mmol via INTRAVENOUS
  Filled 2015-10-09: qty 10

## 2015-10-09 MED ORDER — MIDAZOLAM HCL 2 MG/2ML IJ SOLN: 1.0000 mg | INTRAMUSCULAR | Status: DC | PRN

## 2015-10-09 MED ORDER — WARFARIN SODIUM 5 MG PO TABS
5.0000 mg | ORAL_TABLET | Freq: Once | ORAL | Status: AC
  Administered 2015-10-09: 5 mg via ORAL
  Filled 2015-10-09: qty 1

## 2015-10-09 MED ORDER — POTASSIUM CHLORIDE 20 MEQ/15ML (10%) PO SOLN
40.0000 meq | Freq: Once | ORAL | Status: AC
  Administered 2015-10-09: 40 meq
  Filled 2015-10-09: qty 30

## 2015-10-09 NOTE — Progress Notes (Signed)
[place] Progress Note Patient Name: [name] DOB: [birth year] MRN: [id number]   Date of Service  10/09/2015  HPI/Events of Note  hypokalemia  eICU Interventions  Potassium replaced     Intervention Category Minor Interventions: Electrolytes abnormality - evaluation and management  DETERDING,ELIZABETH 10/09/2015, 5:50 AM

## 2015-10-09 NOTE — Progress Notes (Signed)
ANTICOAGULATION CONSULT NOTE - Follow Up Consult  Pharmacy Consult for Coumadin/Heparin Indication: atrial fibrillation  Allergies  Allergen Reactions  . Augmentin [Amoxicillin-Pot Clavulanate] Anaphylaxis and Other (See Comments)    Has patient had a PCN reaction causing immediate rash, facial/tongue/throat swelling, SOB or lightheadedness with hypotension: Yes Has patient had a PCN reaction causing severe rash involving mucus membranes or skin necrosis: No Has patient had a PCN reaction that required hospitalization No Has patient had a PCN reaction occurring within the last 10 years: Yes If all of the above answers are "NO", then may proceed with Cephalosporin use.  [name] Kitchen Lisinopril Anaphylaxis and Cough    Patient Measurements: Height: 5\' 4"  (162.6 cm) Weight: 173 lb 8 oz (78.7 kg) IBW/kg (Calculated) : 54.7 Heparin Dosing Weight: 78.7  Vital Signs: Temp: 101.1 F (38.4 C) (09/22 0800) Temp Source: Oral (09/22 0800) BP: 117/62 (09/22 1100) Pulse Rate: 121 (09/22 1100)  Labs:  Recent Labs  10/07/15 0431  10/07/15 1741  10/08/15 0425 10/08/15 1400 10/09/15 0030 10/09/15 0452 10/09/15 0828  HGB 8.7*  --   --   --  8.1*  --   --  7.3*  --   HCT 29.4*  --   --   --  27.0*  --   --  23.8*  --   PLT 135*  --   --   --  184  --   --  168  --   LABPROT  --   --   --   --   --   --   --  17.3*  --   INR  --   --   --   --   --   --   --  1.40  --   HEPARINUNFRC  --   --   --   < > 0.20* 0.21* 0.42  --  0.49  CREATININE  --   < > 1.00  --  1.62*  --   --  2.79*  --   < > = values in this interval not displayed.  Estimated Creatinine Clearance: 17.4 mL/min (by C-G formula based on SCr of 2.79 mg/dL (H)).   Medications:  . feeding supplement (VITAL HIGH PROTEIN) 1,000 mL (10/09/15 1000)  . heparin 1,450 Units/hr (10/09/15 1000)    Assessment: 49 yoF on IV heparin in setting of acute shock liver and CRRT. Elevated ALT 92, but trending down, AST wnl. Baseline INR 1.19 on  9/19. Pt given coumadin 5 mg x 1 last night. INR today is 1.4. CBC stable. No bleeding noted. Pt was on Xarelto PTA for AF. Last dose 9/10.  Heparin level 0.49 at rate of 1450 units/hr. No interruption with infusion, no issues with lines.  Goal of Therapy:  INR 2-3 Heparin level 0.3-0.7   Plan:  1. Initiate Coumadin at same dose of 5 mg po x 1. 2. Continue Heparin 1450 Units/hr until INR therapeutic x 24 hrs. 3. Monitor INR and heparin level daily 4. Monitor CBC, S/Sx bleeding daily   Leroy Libman 10/09/2015,11:57 AM

## 2015-10-09 NOTE — Progress Notes (Signed)
.   ANTICOAGULATION CONSULT NOTE - Follow-up Consult  Pharmacy Consult for heparin Indication: atrial fibrillation  Allergies  Allergen Reactions  . Augmentin [Amoxicillin-Pot Clavulanate] Anaphylaxis and Other (See Comments)    Has patient had a PCN reaction causing immediate rash, facial/tongue/throat swelling, SOB or lightheadedness with hypotension: Yes Has patient had a PCN reaction causing severe rash involving mucus membranes or skin necrosis: No Has patient had a PCN reaction that required hospitalization No Has patient had a PCN reaction occurring within the last 10 years: Yes If all of the above answers are "NO", then may proceed with Cephalosporin use.  [name] Kitchen Lisinopril Anaphylaxis and Cough    Patient Measurements: Height: 5\' 4"  (162.6 cm) Weight: 172 lb 2.9 oz (78.1 kg) IBW/kg (Calculated) : 54.7 Heparin Dosing Weight: 76  Vital Signs: Temp: 99.3 F (37.4 C) (09/21 2330) Temp Source: Axillary (09/21 2330) BP: 146/74 (09/22 0200) Pulse Rate: 111 (09/22 0200)  Labs:  Recent Labs  10/06/15 0402  10/06/15 0403  10/07/15 0431  10/07/15 1629 10/07/15 1741 10/08/15 0425 10/08/15 1400 10/09/15 0030  HGB  --   < > 8.5*  --  8.7*  --   --   --  8.1*  --   --   HCT  --   --  28.5*  --  29.4*  --   --   --  27.0*  --   --   PLT  --   --  131*  --  135*  --   --   --  184  --   --   APTT 65*  --   --   --   --   --   --   --   --   --   --   LABPROT 15.2  --   --   --   --   --   --   --   --   --   --   INR 1.19  --   --   --   --   --   --   --   --   --   --   HEPARINUNFRC 0.32  --   --   --   --   --   --   --  0.20* 0.21* 0.42  CREATININE  --   --  0.94  < >  --   < > 3.34* 1.00 1.62*  --   --   < > = values in this interval not displayed.  Estimated Creatinine Clearance: 29.9 mL/min (by C-G formula based on SCr of 1.62 mg/dL (H)).   Assessment: 18 yoF on IV heparin in setting of acute shock liver and CRRT. Elevated ALT 92, but trending down, AST wnl. Baseline  INR 1.19 on 9/19. Pt to transition to Coumadin. CBC stable. No bleeding noted. Pt was on Xarelto PTA for AF. Last dose 9/10.  Heparin level 0.42, therapeutic on 1450 units/hr. No bleeding noted.  Goal of Therapy:  INR 2-3 Heparin level 0.3-0.7   Plan:  - Continue heparin at 1450 Units/hr  - f/u confirmatory heparin level at [place], PharmD, BCPS Clinical pharmacist, pager [phone] 10/09/2015,2:20 AM

## 2015-10-09 NOTE — Progress Notes (Signed)
Nutrition Follow-up  DOCUMENTATION CODES:   Obesity unspecified  INTERVENTION:   Continue Vital High Protein at goal of 25 ml/hr (600 ml/day) via OG tube  Continue60 ml Prostat BID Provides: 1000 kcal, 112 grams protein, and 501 ml H2O.  NUTRITION DIAGNOSIS:   Inadequate oral intake related to inability to eat as evidenced by NPO status.  Ongoing  GOAL:   Provide needs based on ASPEN/SCCM guidelines  Met with TF  MONITOR:   TF tolerance, Skin, I & O's, Labs, Vent status  REASON FOR ASSESSMENT:   Ventilator, Consult Enteral/tube feeding initiation and management  ASSESSMENT:   65 female, admitted after a fall, had repair of her right shoulder on 9/7, don't have chronic atrial fibrillation, becoming hypotensive overnight and noted to be bradycardic today. Was diaphoretic. Noted to be junctional rhythm with nonspecific ST-T wave changes. Intubated for airway protection.   Patient remains on ventilator support via trach MV: 4.3 L/min Temp (24hrs), Avg:99.3 F (37.4 C), Min:98.1 F (36.7 C), Max:101.1 F (38.4 C)  Pt underwent trach placement on 10/07/15 via PCCM service.   CCRT d/c on 10/07/15. Nephrology following; pt making some urine.   Pt in with respiratory therapy at time of visit.   NGT placed on 10/07/15; KUB confirmed tip of tube in the distal stomach. TF resumed on 10/08/15. Vital High Protein is infusing @ 25 ml/hr. Pt also receiving 60 ml Prostat BID. Complete regimen provides 1000 kcal, 112 grams protein, and 501 ml H2O, which meets 100% of estimated kcal and protein needs.   Case discussed with RN, who confirms that pt continues to tolerate TF well. Plan to transfer to [place] (SDU) later this afternoon. Per MD notes, potential for [facility] evaluation next week.   Labs reviewed: K: 3.4, Phos: 1.1, CBGS: 101-151. K and Phos to be repleted per nephrology note.   Diet Order:  Diet - low sodium heart healthy Diet general Diet NPO time specified  Skin:  Wound  (see comment) (unstageable pressure ulcer to L heel, lower leg; incision on elbow)  Last BM:  10/08/15  Height:   Ht Readings from Last 1 Encounters:  10/01/15 '5\' 4"'$  (1.626 m)    Weight:   Wt Readings from Last 1 Encounters:  10/09/15 173 lb 8 oz (78.7 kg)    Ideal Body Weight:  54.5 kg  BMI:  Body mass index is 29.78 kg/m.  Estimated Nutritional Needs:   Kcal:  681-1572  Protein:  >/= 109 grams  Fluid:  > 1.5 L/day  EDUCATION NEEDS:   No education needs identified at this time  Donnia Poplaski A. Jimmye Norman, RD, LDN, CDE Pager: [phone] After hours Pager: [phone]

## 2015-10-09 NOTE — Progress Notes (Signed)
Order to discontinue central line reviewed, Pt will need IV access, Left arm assessed and remains swollen, IV team notified for assistance with access. Will monitor

## 2015-10-09 NOTE — Progress Notes (Signed)
Patient ID: Cristina [name], female   DOB: [birth year], 75 y.o.   MRN: [id number]  [facility] Progress Note   Subjective:    Stopped CRRT  (9/20) Making some urine Trached 9/20 Pressors have remained off Still agitated Getting TF at 25/hour K and phos low being replaced    Objective:   BP 112/62   Pulse (!) 122   Temp 98.1 F (36.7 C) (Axillary)   Resp (!) 22   Ht 5\' 4"  (1.626 m)   Wt 78.7 kg (173 lb 8 oz)   SpO2 100%   BMI 29.78 kg/m   Intake/Output Summary (Last 24 hours) at 10/09/15 0903 Last data filed at 10/09/15 0600  Gross per 24 hour  Intake          1185.95 ml  Output              305 ml  Net           880.95 ml    Weight trending       10/09/15  78.7 kg   10/08/15  78.1 kg   10/07/15  79.9 kg CRRT discontinued  10/06/15 0406  81.5 kg (179 lb 10.8 oz)   10/05/15 0500  83.4 kg (183 lb 13.8 oz)   10/04/15 0400  84 kg (185 lb 3 oz)   10/03/15 0407  83.5 kg (184 lb 1.4 oz)   10/02/15 0341  86.8 kg (191 lb 5.8 oz)   10/01/15 0400  91.4 kg (201 lb 8 oz)   09/30/15 0400  93.3 kg (205 lb 11 oz)   09/29/15 0500  92.7 kg (204 lb 5.9 oz) CRRT initiated  09/28/15 0500  90.2 kg (198 lb 13.7 oz)    09/24/15 0935  81.2 kg (179 lb     Physical Exam: CVP 8 (6) Trached and on vent Grimaces and looks uncomfortable Lines R IJ HD cath (9/12), left IJ TLC Pulse irregular irreg, S1 and S2 normal Clear anteriorly Soft, obese, not distended, grimaces with exam 1+ edema both LE's and UE's with pitting edema to the thighs that is unchanged Urine in foley small amt  Imaging: Dg Chest Port 1 View  Result Date: 10/08/2015 CLINICAL DATA:  Respiratory failure. EXAM: PORTABLE CHEST 1 VIEW COMPARISON:  10/07/2015 FINDINGS: Tracheostomy tube, right IJ central line, left IJ central line, and nasogastric tube are in place as before. The heart is enlarged. There is persistent dense opacity at the left lung base which obscures the hemidiaphragm. There has been  improvement in aeration at the right lung base. No evidence for pulmonary edema. IMPRESSION: Improvement in aeration of the right lung base. Persistent significant opacity in the left lower lobe. Electronically Signed   By: Nolon Nations M.D.   On: 10/08/2015 08:19   Dg Chest Port 1 View  Result Date: 10/07/2015 CLINICAL DATA:  Tracheostomy placement EXAM: PORTABLE CHEST 1 VIEW COMPARISON:  October 06, 2015 FINDINGS: There is no a tracheostomy catheter present with the tip 4.9 cm above the carina. Central catheter tips are in the superior vena cava. Nasogastric tube tip and side port are below the diaphragm. No pneumothorax. There is focal airspace consolidation in the left base. There are small pleural effusions bilaterally. There is mild right base atelectasis. Heart is mildly enlarged with mild pulmonary venous hypertension. There is atherosclerotic calcification in the aortic arch. No adenopathy evident. There is advanced erosion of the left humeral head, stable. There is calcification in the left carotid artery.  IMPRESSION: Tube and catheter positions as described. No tracheostomy present. No pneumothorax. Left lower lobe airspace consolidation, likely pneumonia, present. There is a degree of underlying pulmonary vascular congestion. Small pleural effusions bilaterally noted. There may well be a degree of frank congestive heart failure superimposed. There is aortic atherosclerosis. There is left carotid artery calcification. Advanced erosion of the left humeral head noted. Electronically Signed   By: Lowella Grip III M.D.   On: 10/07/2015 09:47   Dg Abd Portable 1v  Result Date: 10/07/2015 CLINICAL DATA:  Feeding tube placement EXAM: PORTABLE ABDOMEN - 1 VIEW COMPARISON:  None. FINDINGS: Feeding tube tip is in distal stomach. Bowel gas pattern is normal. No bowel obstruction or free air evident. There is moderate stool in the colon. There is consolidation in the left lung base. IMPRESSION:  Feeding tube tip in distal stomach. Bowel gas pattern unremarkable. Left base airspace consolidation. Electronically Signed   By: Lowella Grip III M.D.   On: 10/07/2015 09:48   Labs:  Recent Labs Lab 10/06/15 0403 10/06/15 1600 10/07/15 0434 10/07/15 1629 10/07/15 1741 10/08/15 0425 10/09/15 0452  NA 137 138 138 140 139 138 136  K 4.2 4.2 4.2 3.8 4.2 4.4 3.4*  CL 102 101 103 106 103 105 102  CO2 29 29 28 23 28  20* 25  GLUCOSE 119* 129* 113* 160* 123* 123* 123*  BUN 20 18 22* 85* 24* 48* 81*  CREATININE 0.94 0.74 0.95 3.34* 1.00 1.62* 2.79*  CALCIUM 9.3 9.1 9.4 7.8* 9.2 9.2 9.2  PHOS 2.1* 1.3* 1.7* 5.8* 2.3* 1.9* 1.1*     Recent Labs Lab 10/06/15 0403 10/07/15 0431 10/08/15 0425 10/09/15 0452  WBC 15.4* 17.0* 13.4* 12.7*  HGB 8.5* 8.7* 8.1* 7.3*  HCT 28.5* 29.4* 27.0* 23.8*  MCV 91.9 92.2 91.2 88.1  PLT 131* 135* 184 168     Iron/TIBC/Ferritin/ %Sat    Component Value Date/Time   IRON 49 10/01/2015 0830   TIBC 280 10/01/2015 0830   FERRITIN 1,292 (H) 10/01/2015 0830   IRONPCTSAT 18 10/01/2015 0830   Medications:    . arformoterol  15 mcg Nebulization BID  . aspirin  81 mg Oral Daily  . budesonide (PULMICORT) nebulizer solution  0.5 mg Nebulization BID  . chlorhexidine  15 mL Mouth Rinse BID  . citalopram  20 mg Per Tube QHS  . darbepoetin (ARANESP) injection - NON-DIALYSIS  100 mcg Subcutaneous Q Thu-1800  . feeding supplement (PRO-STAT SUGAR FREE 64)  60 mL Per Tube BID  . insulin aspart  0-20 Units Subcutaneous Q4H  . mouth rinse  15 mL Mouth Rinse 10 times per day  . pantoprazole sodium  40 mg Per Tube Daily  . pramipexole  2 mg Oral QHS  . sodium chloride  250 mL Intravenous Once  . sodium phosphate  Dextrose 5% IVPB  30 mmol Intravenous Once  . Vitamin D (Ergocalciferol)  50,000 Units Oral Q7 days  . Warfarin - Pharmacist Dosing Inpatient   Does not apply q1800   Infusions . feeding supplement (VITAL HIGH PROTEIN) 1,000 mL (10/08/15 1851)  .  heparin 1,450 Units/hr (10/08/15 2000)   Background: 75 yo female with AF, HTN, HLD, gut, asthma - s/p fall with R distal humerus fx, repaired on 09/24/15. Developed bradycardia, hypotension, progressive renal failure >>likely from PNA, and medications (beta blocker/calcium channel blocker). Resultant hypoxemia respiratory failure, shock liver. Baseline creatinine around 0.7. CRRT 09/29/15-10/07/15  for anuric renal failure, volume overload.(catheter placed 9/12 by CCM)  Assessment/ Plan:    Acute kidney injury: Ischemic ATN secondary to profound/sustained hypotension/PNA.  Dialysis dependent since 09/29/15  (CRRT 9/12 - 9/20) CXR no edema, still with LE edema to thighs despite drop in vol from 92/7->78 kg. Making small amt urine Creatinine rising at rate c/w no return of GFR yet No indication for hemo today but will give dose of lasix 120 to see if augments UOP and re-eval in AM for HD indications  Hypophosphatemia Quite low this AM Possibly related to refeeding Rec'd 30 mmoles this AM via elink Recheck this PM  Hypokalemia Repleted via elink 40 via tube (will also be rechecked this afternoon)  Hypoxic respiratory failure/sepsis/pneumonia: Finished levaquin for LLL PNA S/p trach 9/20  S/p R humerus fx - op 09/24/15 Open treatment internal fixation of right supracondylar humerus fracture with intracondylar extension Olecranon osteotomy and ORIF of olecranon with internal fixation Subcutaneous transposition of ulnar nerve at elbow  Elevated LFTs/coagulopathy/shock liver Resolved  Anemia: Hb continues to trend down 7.3 today S/p Feraheme 510 IV and Aranesp 100 yesterday May require transfusion  AFib On IV heparin CCM resuming metoprolol Eventual nuclear stress test  Limited Code status (no CPR or defib)   Jamal Maes, MD [facility] [phone] Pager 10/09/2015, 9:03 AM

## 2015-10-09 NOTE — Progress Notes (Signed)
PULMONARY / CRITICAL CARE MEDICINE   Name: Cristina [name] MRN: [id number] DOB: 11-May-1940    ADMISSION DATE:  09/24/2015 CONSULTATION DATE:  09/27/15  REFERRING MD: Dr. Erlinda Hong  CHIEF COMPLAINT:  Fall   SUBJECTIVE:  Tolerating pressure support.  VITAL SIGNS: BP 112/62   Pulse (!) 122   Temp 98.1 F (36.7 C) (Axillary)   Resp (!) 22   Ht 5\' 4"  (1.626 m)   Wt 173 lb 8 oz (78.7 kg)   SpO2 100%   BMI 29.78 kg/m   HEMODYNAMICS: CVP:  [5 mmHg-8 mmHg] 8 mmHg  VENTILATOR SETTINGS: Vent Mode: CPAP;PSV FiO2 (%):  [30 %] 30 % Set Rate:  [16 bmp] 16 bmp Vt Set:  [500 mL] 500 mL PEEP:  [5 cmH20] 5 cmH20 Pressure Support:  [5 cmH20] 5 cmH20 Plateau Pressure:  [12 cmH20-15 cmH20] 15 cmH20  INTAKE / OUTPUT: I/O last 3 completed shifts: In: 1814.3 [I.V.:587.3; NG/GT:850; IV Piggyback:377] Out: 470 [Urine:470]  PHYSICAL EXAMINATION: General: sleepy Neuro: wakes up easily, follows commands HEENT: #6 trach unremarkable  Cardiac: irregular Chest: no wheeze Abd: soft, non tender Ext: 1+ edema, Rt arm in wrap Skin: no rashes  LABS:  BMET  Recent Labs Lab 10/07/15 1741 10/08/15 0425 10/09/15 0452  NA 139 138 136  K 4.2 4.4 3.4*  CL 103 105 102  CO2 28 20* 25  BUN 24* 48* 81*  CREATININE 1.00 1.62* 2.79*  GLUCOSE 123* 123* 123*   Electrolytes  Recent Labs Lab 10/07/15 0431  10/07/15 1741 10/08/15 0425 10/09/15 0452  CALCIUM  --   < > 9.2 9.2 9.2  MG 2.6*  --   --  2.4 2.2  PHOS  --   < > 2.3* 1.9* 1.1*  < > = values in this interval not displayed.   CBC  Recent Labs Lab 10/07/15 0431 10/08/15 0425 10/09/15 0452  WBC 17.0* 13.4* 12.7*  HGB 8.7* 8.1* 7.3*  HCT 29.4* 27.0* 23.8*  PLT 135* 184 168   Coag's  Recent Labs Lab 10/04/15 1616 10/05/15 0515 10/06/15 0402 10/09/15 0452  APTT 94* 72* 65*  --   INR  --  1.19 1.19 1.40    Sepsis Markers No results for input(s): LATICACIDVEN, PROCALCITON, O2SATVEN in the last 168 hours.  ABG No  results for input(s): PHART, PCO2ART, PO2ART in the last 168 hours.   Liver Enzymes  Recent Labs Lab 10/03/15 0500  10/06/15 0403  10/07/15 1741 10/08/15 0425 10/09/15 0452  AST 83*  --  25  --   --  20  --   ALT 674*  --  181*  --   --  92*  --   ALKPHOS 173*  --  109  --   --  109  --   BILITOT 1.4*  --  2.0*  --   --  0.8  --   ALBUMIN 3.0*  2.9*  < > 4.0  4.0  < > 3.2* 2.8* 2.6*  < > = values in this interval not displayed.   Cardiac Enzymes No results for input(s): TROPONINI, PROBNP in the last 168 hours.  Glucose  Recent Labs Lab 10/08/15 1159 10/08/15 1644 10/08/15 1957 10/08/15 2331 10/09/15 0331 10/09/15 0810  GLUCAP 139* 111* 122* 108* 127* 101*   Imaging No results found.   STUDIES:  2D echo 9/10 >> EF 99991111, mod MR, systolic function moderately reduced, PA peak pressure 47mm  CULTURES: Blood 9/10 >negative Sputum 9/10 > normal flora  MRSA  9/10 >negative  ANTIBIOTICS: Levaquin 9/10 > 9/16 Vancomycin 9/10 > 9/13  SIGNIFICANT EVENTS: 9/07 admit after fall. Had repair of right shoulder surgery. 9/10 PCCM consulted for hypotension, bradycardia. Intubated for airway.  Xarelto stopped. 9/12 Renal consulted, start CRRT 9/19 Family meeting >> limited resuscitation; off pressors 9/20 trach 9/21 stopped precedex  LINES/TUBES: L IJ CVL 9/10>>> 9/22 ETT 9/11>>>9/20 Trach 6 cuffed (DF) 9/20>>> R IJ HD catheter 9/12>>>  DISCUSSION: 75 yo female had fall and developed Rt distal humerus fx.  Had repair on 9/07.  Developed bradycardia, hypotension, progressive renal failure >> likely from PNA, and medications (beta blocker/calcium channel blocker). Now s/p prolonged critical illness w/ weaning efforts c/b delirium, severe malnutrition & deconditioning. Her trach was placed 9/20.  Tolerating pressure support, and might be ready for trach collar trial soon.  PMHx of A fib on xarelto, HTN, HLD, Gout, Asthma, Anxiety.   ASSESSMENT /  PLAN:  PULMONARY A: Acute hypoxic respiratory failure 2nd to HCAP, acute pulmonary edema. Hx of asthma. P:   Pressure support wean to trach collar as tolerated Once off vent, will get speech to assess for PM valve F/u CXR intermittently Continue pulmicort. brovana, prn duoneb  CARDIOVASCULAR A:  Shock 2nd to sepsis from PNA and bradycardia from beta blocker/calcium channel blocker-->resolved. Chronic A fib. Elevated troponin 2nd to demand ischemia. Moderate mitral regurgitation. Hx of HTN, HLD. P:  Transition from heparin to coumadin per pharmacy LFT's improved >> resume lipitor 9/22 Resume metoprolol 9/22 Nuclear stress test once recovered  RENAL A:   AKI 2nd to ischemia >> baseline creatinine 0.77 from 08/19/15. P:   IHD per renal   GASTROINTESTINAL A:   Shock liver 2nd to ischemia - resolved. Nutrition. P:   Tube feeds Protonix for SUP  HEMATOLOGIC A:   Anemia of critical illness. Mild thrombocytopenia. Coagulopathy - r/t shock liver >> resolved. P:  F/u CBC  INFECTIOUS A:   Sepsis 2nd to HCAP >> completed Abx 9/16. P:   Monitor off Abx Trend wbc and fever curve   ENDOCRINE A:   Relative adrenal insufficiency - cortisol 14.3 from 09/28/15 >> off solu cortef. Hyperglycemia. P:   SSI while on tube feeds  NEUROLOGIC A:   Acute metabolic encephalopathy. Hx of anxiety. Hx of RLS. Deconditioning. P:   RASS goal: 0 Prn versed, fentanyl Continue celexa, mirapex Added 1/2 dosing of her home xanax PT/OT  ORTHOPEDICS A: Rt humerus fracture. P: Per ortho  GOALS OF CARE A: Limited resuscitation. P: Family okay with trach and short term vent support Would not want SNF with long term HD and vent support No CPR/defibrillation  Will transfer to 2c vent sdu bed.  Will need to discuss with family if they would want to consider LTAC placement at some point.  [name], MD [facility] Pulmonary/Critical Care 10/09/2015, 9:09 AM Pager:   [phone] After 3pm call: [phone]

## 2015-10-09 NOTE — Progress Notes (Signed)
[place] Progress Note Patient Name: Cristina [name] DOB: 1940/03/09 MRN: [id number]   Date of Service  10/09/2015  HPI/Events of Note  hypophos  eICU Interventions  Phos replaced     Intervention Category Intermediate Interventions: Electrolyte abnormality - evaluation and management  DETERDING,ELIZABETH 10/09/2015, 5:09 AM

## 2015-10-10 ENCOUNTER — Inpatient Hospital Stay (HOSPITAL_COMMUNITY): Payer: Medicare Other

## 2015-10-10 DIAGNOSIS — Z930 Tracheostomy status: Secondary | ICD-10-CM

## 2015-10-10 LAB — MAGNESIUM: Magnesium: 2 mg/dL (ref 1.7–2.4)

## 2015-10-10 LAB — CBC
HCT: 24 % — ABNORMAL LOW (ref 36.0–46.0)
Hemoglobin: 7.4 g/dL — ABNORMAL LOW (ref 12.0–15.0)
MCH: 27.6 pg (ref 26.0–34.0)
MCHC: 30.8 g/dL (ref 30.0–36.0)
MCV: 89.6 fL (ref 78.0–100.0)
PLATELETS: 202 10*3/uL (ref 150–400)
RBC: 2.68 MIL/uL — AB (ref 3.87–5.11)
RDW: 17.6 % — ABNORMAL HIGH (ref 11.5–15.5)
WBC: 14.7 10*3/uL — ABNORMAL HIGH (ref 4.0–10.5)

## 2015-10-10 LAB — RENAL FUNCTION PANEL
ALBUMIN: 2.8 g/dL — AB (ref 3.5–5.0)
ANION GAP: 9 (ref 5–15)
BUN: 106 mg/dL — ABNORMAL HIGH (ref 6–20)
CALCIUM: 9.3 mg/dL (ref 8.9–10.3)
CO2: 27 mmol/L (ref 22–32)
Chloride: 103 mmol/L (ref 101–111)
Creatinine, Ser: 3.27 mg/dL — ABNORMAL HIGH (ref 0.44–1.00)
GFR calc Af Amer: 15 mL/min — ABNORMAL LOW (ref >60)
GFR calc non Af Amer: 13 mL/min — ABNORMAL LOW (ref >60)
GLUCOSE: 108 mg/dL — AB (ref 65–99)
PHOSPHORUS: 4.7 mg/dL — AB (ref 2.5–4.6)
Potassium: 3.6 mmol/L (ref 3.5–5.1)
SODIUM: 139 mmol/L (ref 135–145)

## 2015-10-10 LAB — GLUCOSE, CAPILLARY
GLUCOSE-CAPILLARY: 117 mg/dL — AB (ref 65–99)
GLUCOSE-CAPILLARY: 124 mg/dL — AB (ref 65–99)
GLUCOSE-CAPILLARY: 143 mg/dL — AB (ref 65–99)
GLUCOSE-CAPILLARY: 123 mg/dL — AB (ref 65–99)
Glucose-Capillary: 113 mg/dL — ABNORMAL HIGH (ref 65–99)
Glucose-Capillary: 124 mg/dL — ABNORMAL HIGH (ref 65–99)

## 2015-10-10 LAB — PROTIME-INR
INR: 1.48
PROTHROMBIN TIME: 18.1 s — AB (ref 11.4–15.2)

## 2015-10-10 LAB — HEPARIN LEVEL (UNFRACTIONATED): Heparin Unfractionated: 0.51 IU/mL (ref 0.30–0.70)

## 2015-10-10 MED ORDER — ATORVASTATIN CALCIUM 20 MG PO TABS
20.0000 mg | ORAL_TABLET | Freq: Every day | ORAL | Status: DC
  Administered 2015-10-10 – 2015-10-13 (×4): 20 mg via ORAL
  Filled 2015-10-10 (×4): qty 1

## 2015-10-10 MED ORDER — METOPROLOL TARTRATE 50 MG PO TABS
50.0000 mg | ORAL_TABLET | Freq: Two times a day (BID) | ORAL | Status: DC
  Administered 2015-10-10 – 2015-10-11 (×4): 50 mg via ORAL
  Filled 2015-10-10 (×4): qty 1

## 2015-10-10 MED ORDER — VITAL 1.5 CAL PO LIQD
1000.0000 mL | ORAL | Status: DC
Start: 2015-10-10 — End: 2015-10-14
  Administered 2015-10-10 – 2015-10-14 (×6): 1000 mL
  Filled 2015-10-10 (×7): qty 1000

## 2015-10-10 MED ORDER — WARFARIN SODIUM 5 MG PO TABS
5.0000 mg | ORAL_TABLET | Freq: Once | ORAL | Status: AC
  Administered 2015-10-10: 5 mg via ORAL
  Filled 2015-10-10: qty 1

## 2015-10-10 NOTE — Progress Notes (Signed)
Nutrition Follow-up  DOCUMENTATION CODES:  Obesity unspecified  INTERVENTION:  Change TF formula  Initiate TF via NGT with VITAL 1.5 at goal rate of 50 ml/h (1.2 ml per day)  to provide 1800 kcals, 81 gm protein (decreased by 28%) , 1014 ml free water daily.  NUTRITION DIAGNOSIS:  Altered nutrition lab value related to  Poor renal function and suspected excess protein delivery as evidenced by BUN>100.  GOAL:  Provide needs based on ASPEN/SCCM guidelines  MONITOR:  TF tolerance, Skin, I & O's, Labs, Vent status  ASSESSMENT:  85 female, admitted after a fall, had repair of her right shoulder on 9/7, don't have chronic atrial fibrillation, becoming hypotensive overnight and noted to be bradycardic today. Was diaphoretic. Noted to be junctional rhythm with nonspecific ST-T wave changes. Intubated for airway protection.   Paged by Nephrologist MD.   Patient suspected to have increasing Azotemia suspected due to TF. BUN/creat rising disproportionately.  Currently not on Ventilator support.   Per discussion, alter TF to give pt 1 g/kg bw.   Difficult balance as patient also has wounds.    Meds: Insulin, ppi, Vit D, warfarin Labs: BUN/CREAT 106/3.27, Phos to 4.7, Albumin:2.8   Recent Labs Lab 10/08/15 0425 10/09/15 0452 10/09/15 1551 10/10/15 0500  NA 138 136 139 139  K 4.4 3.4* 3.9 3.6  CL 105 102 104 103  CO2 20* 25 27 27   BUN 48* 81* 96* 106*  CREATININE 1.62* 2.79* 3.17* 3.27*  CALCIUM 9.2 9.2 9.2 9.3  MG 2.4 2.2  --  2.0  PHOS 1.9* 1.1* 3.7 4.7*  GLUCOSE 123* 123* 106* 108*   Diet Order:  Diet - low sodium heart healthy Diet general Diet NPO time specified  Skin:  Wound- unstageable pressure ulcer to L heel, lower leg; incision on elbow  Last BM:  9/22  Height:  Ht Readings from Last 1 Encounters:  10/01/15 5\' 4"  (1.626 m)    Weight:  Wt Readings from Last 1 Encounters:  10/10/15 176 lb 12.9 oz (80.2 kg)   Wt Readings from Last 10 Encounters:   10/10/15 176 lb 12.9 oz (80.2 kg)  09/15/15 179 lb (81.2 kg)  08/19/15 170 lb (77.1 kg)  01/23/14 177 lb 6.4 oz (80.5 kg)  10/17/13 179 lb 3.2 oz (81.3 kg)  08/07/13 165 lb (74.8 kg)  07/28/13 172 lb 2.9 oz (78.1 kg)  Dosing weight 172 lbs (78.18 kg)  Ideal Body Weight:  54.5 kg  BMI:  Body mass index with Dosing weight is 29.6 kg/m.  Estimated Nutritional Needs:  Kcal:  1650-1850 kcals (30-34 kcal/kg bw) Protein:  78 g (1 g/kg) Fluid:  PER MD  EDUCATION NEEDS:  No education needs identified at this time  Burtis Junes RD, LDN, [place] Clinical Nutrition Pager: [phone] 10/10/2015 10:04 AM

## 2015-10-10 NOTE — Progress Notes (Signed)
.   ANTICOAGULATION CONSULT NOTE - Follow-up Consult  Pharmacy Consult for heparin/warfarin Indication: atrial fibrillation  Allergies  Allergen Reactions  . Augmentin [Amoxicillin-Pot Clavulanate] Anaphylaxis and Other (See Comments)    Has patient had a PCN reaction causing immediate rash, facial/tongue/throat swelling, SOB or lightheadedness with hypotension: Yes Has patient had a PCN reaction causing severe rash involving mucus membranes or skin necrosis: No Has patient had a PCN reaction that required hospitalization No Has patient had a PCN reaction occurring within the last 10 years: Yes If all of the above answers are "NO", then may proceed with Cephalosporin use.  [name] Kitchen Lisinopril Anaphylaxis and Cough    Patient Measurements: Height: 5\' 4"  (162.6 cm) Weight: 176 lb 12.9 oz (80.2 kg) IBW/kg (Calculated) : 54.7 Heparin Dosing Weight: 76  Vital Signs: Temp: 97.8 F (36.6 C) (09/23 0400) Temp Source: Oral (09/23 0400) BP: 142/84 (09/23 0600) Pulse Rate: 107 (09/23 0754)  Labs:  Recent Labs  10/08/15 0425  10/09/15 0030 10/09/15 0452 10/09/15 0828 10/09/15 1551 10/10/15 0500 10/10/15 0514  HGB 8.1*  --   --  7.3*  --   --  7.4*  --   HCT 27.0*  --   --  23.8*  --   --  24.0*  --   PLT 184  --   --  168  --   --  202  --   LABPROT  --   --   --  17.3*  --   --  18.1*  --   INR  --   --   --  1.40  --   --  1.48  --   HEPARINUNFRC 0.20*  < > 0.42  --  0.49  --   --  0.51  CREATININE 1.62*  --   --  2.79*  --  3.17* 3.27*  --   < > = values in this interval not displayed.  Estimated Creatinine Clearance: 15 mL/min (by C-G formula based on SCr of 3.27 mg/dL (H)).   Assessment: 44 yoF with h/o afib on Xarelto PTA (last dose 9/10). Transitioned to IV heparin in setting of acute shock liver and AKI. Elevated ALT 92, but trending down, AST wnl. Baseline INR 1.19 on 9/19. Pt to transition to Coumadin starting 9/21 with questionable recovery of renal function.   INR today  remains SUBtherapeutic at 1.48 but HL therapeutic at 0.51. Hgb remains low but stable, pltc wnl with no bleeding issues reported on Aranesp qThurs.   Goal of Therapy:  INR 2-3 Heparin level 0.3-0.7   Plan:  - Coumadin 5 mg PO x 1 tonight  - Continue heparin at 1450 Units/hr  - Daily HL/INR/CBC - Monitor closely for s/s bleeding   Hesham Womac K. Velva Harman, PharmD, BCPS, CPP Clinical Pharmacist Pager: [phone] Phone: [phone] 10/10/2015 8:35 AM

## 2015-10-10 NOTE — Progress Notes (Addendum)
Patient ID: TAMERIA GIVANS, female   DOB: 1940-06-25, 75 y.o.   MRN: [id number]  [facility] Progress Note   Subjective:    Stopped CRRT  (9/20) UOP has improved dramatically Not wanting to be bothered, refusing nursing care (except occasional turning and suctioning of trach) Told NP for CCM "don't want any of this" Fortunately making urine without diuretics TF's changed to get rid of the high protein TF's    Objective:   BP (!) 142/84   Pulse (!) 107   Temp 97.8 F (36.6 C) (Oral)   Resp 20   Ht 5\' 4"  (1.626 m)   Wt 80.2 kg (176 lb 12.9 oz)   SpO2 100%   BMI 30.35 kg/m   Intake/Output Summary (Last 24 hours) at 10/10/15 0935 Last data filed at 10/10/15 0600  Gross per 24 hour  Intake          1114.17 ml  Output             1815 ml  Net          -700.83 ml   Weight trending  10/07/15  79.9 kg CRRT discontinued  10/06/15 0406  81.5 kg (179 lb 10.8 oz)   10/05/15 0500  83.4 kg (183 lb 13.8 oz)   10/04/15 0400  84 kg (185 lb 3 oz)   10/03/15 0407  83.5 kg (184 lb 1.4 oz)   10/02/15 0341  86.8 kg (191 lb 5.8 oz)   10/01/15 0400  91.4 kg (201 lb 8 oz)   09/30/15 0400  93.3 kg (205 lb 11 oz)   09/29/15 0500  92.7 kg (204 lb 5.9 oz) CRRT initiated   Physical Exam: Central line out On trach collar R IJ HD cath (9/12) Pulse irregular irreg, S1 and S2 normal Clear anteriorly Soft, obese, not distended, grimaces with exam 1+ edema both LE's and UE's with pitting edema to the thighs Large amt urine clear yellow in foley  Imaging: Dg Chest Port 1 View  Result Date: 10/10/2015 CLINICAL DATA:  Respiratory failure. EXAM: PORTABLE CHEST 1 VIEW COMPARISON:  10/08/2015. FINDINGS: Tracheostomy tube in satisfactory position. Right jugular catheter tip in the superior vena cava. Interval removal of the left jugular catheter. Nasogastric tube extending into the proximal stomach. Stable enlarged cardiac silhouette. Mildly improved bibasilar airspace opacity and  linear density. Thoracic spine and bilateral shoulder degenerative changes. Old left rib fractures. IMPRESSION: 1. Mildly improved left basilar atelectasis or pneumonia. 2. Mildly improved minimal right basilar atelectasis. 3. Stable cardiomegaly Electronically Signed   By: Claudie Revering M.D.   On: 10/10/2015 07:15   Labs:  Recent Labs Lab 10/07/15 1629 10/07/15 1741 10/08/15 0425 10/09/15 0452 10/09/15 1551 10/10/15 0500 10/11/15 0500  NA 140 139 138 136 139 139 141  K 3.8 4.2 4.4 3.4* 3.9 3.6 3.5  CL 106 103 105 102 104 103 100*  CO2 23 28 20* 25 27 27 29   GLUCOSE 160* 123* 123* 123* 106* 108* 184*  BUN 85* 24* 48* 81* 96* 106* 112*  CREATININE 3.34* 1.00 1.62* 2.79* 3.17* 3.27* 3.47*  CALCIUM 7.8* 9.2 9.2 9.2 9.2 9.3 9.1  PHOS 5.8* 2.3* 1.9* 1.1* 3.7 4.7* 4.2     Recent Labs Lab 10/07/15 0431 10/08/15 0425 10/09/15 0452 10/10/15 0500  WBC 17.0* 13.4* 12.7* 14.7*  HGB 8.7* 8.1* 7.3* 7.4*  HCT 29.4* 27.0* 23.8* 24.0*  MCV 92.2 91.2 88.1 89.6  PLT 135* 184 168 202    Lab  Results  Component Value Date   INR 1.82 10/11/2015   INR 1.48 10/10/2015   INR 1.40 10/09/2015    Iron/TIBC/Ferritin/ %Sat    Component Value Date/Time   IRON 49 10/01/2015 0830   TIBC 280 10/01/2015 0830   FERRITIN 1,292 (H) 10/01/2015 0830   IRONPCTSAT 18 10/01/2015 0830   Medications:    . arformoterol  15 mcg Nebulization BID  . aspirin  81 mg Oral Daily  . atorvastatin  20 mg Oral q1800  . budesonide (PULMICORT) nebulizer solution  0.5 mg Nebulization BID  . chlorhexidine  15 mL Mouth Rinse BID  . citalopram  20 mg Per Tube QHS  . darbepoetin (ARANESP) injection - NON-DIALYSIS  100 mcg Subcutaneous Q Thu-1800  . insulin aspart  0-20 Units Subcutaneous Q4H  . mouth rinse  15 mL Mouth Rinse 10 times per day  . metoprolol tartrate  50 mg Oral BID  . pantoprazole sodium  40 mg Per Tube Daily  . pramipexole  2 mg Oral QHS  . Vitamin D (Ergocalciferol)  50,000 Units Oral Q7 days  .  warfarin  5 mg Oral ONCE-1800  . Warfarin - Pharmacist Dosing Inpatient   Does not apply q1800   Infusions . feeding supplement (VITAL 1.5 CAL) 1,000 mL (10/10/15 1514)  . heparin 1,450 Units/hr (10/11/15 0934)   Background: 75 yo female with AF, HTN, HLD, gut, asthma - s/p fall with R distal humerus fx, repaired on 09/24/15. Developed bradycardia, hypotension, progressive renal failure >>likely from PNA, and medications (beta blocker/calcium channel blocker). Resultant hypoxemia respiratory failure, shock liver. Baseline creatinine around 0.7. CRRT 09/29/15-10/07/15  for anuric renal failure, volume overload.(catheter placed 9/12 by CCM). Stopped 10/07/15.   Assessment/ Plan:    1. Acute kidney injury. Baseline creatinine 0.7.  Ischemic ATN secondary to profound/sustained hypotension/PNA.  Dialysis dependent  09/29/15 - 10/07/15 (CRRT - stopped 9/20).  CXR no edema, still with LE edema to thighs despite drop in vol from 92.7->78 kg. Rate of rise of creatinine slowing since stopping CRRT. BUN stable (no higher) with change in TF's/elimination of prostat.  1. No indication for dialysis 2. If creatinine about same tomorrow and still making urine, will probably remove catheter (in since the 12th)  2. Hypoxic respiratory failure/sepsis/pneumonia:Finished levaquin for LLL PNA. S/p trach 9/20. Now on trach collar  3. S/p R humerus fx - op 09/24/15.  Open treatment internal fixation of right supracondylar humerus fracture with intracondylar extension.  Olecranon osteotomy and ORIF of olecranon with internal fixation.  Subcutaneous transposition of ulnar nerve at elbow  4. Anemia:Hb 7.4 stable past couple of days.  1. S/p Feraheme 510 IV  2. S/p Aranesp 100 9/21 (ordered for weekly)  5. AFib.  On IV heparin/warfarinmetoprolol.  Eventual nuclear stress test.  Limited Code status (no CPR or defib)   Jamal Maes, MD [facility] [phone] Pager 10/10/2015, 9:35 AM

## 2015-10-10 NOTE — Progress Notes (Signed)
PT Cancellation Note  Patient Details Name: CAYLYNN BROOKING MRN: [id number] DOB: 1940/09/08   Cancelled Treatment:    Reason Eval/Treat Not Completed: Other (comment). Pt declined participation and frustrated with her situation.   Madalee Altmann 10/10/2015, 9:39 AM [place]

## 2015-10-10 NOTE — Progress Notes (Signed)
PULMONARY / CRITICAL CARE MEDICINE   Name: Cristina [name] MRN: [id number] DOB: 1941/01/05    ADMISSION DATE:  09/24/2015 CONSULTATION DATE:  09/27/15  REFERRING MD: Dr. Erlinda Hong  CHIEF COMPLAINT:  Fall   SUBJECTIVE:  Tmax 101.1, off vent overnight on ATC, weaned to 28% trach collar, no acute events.   VITAL SIGNS: BP (!) 142/84   Pulse (!) 107   Temp 97.8 F (36.6 C) (Oral)   Resp 20   Ht 5\' 4"  (1.626 m)   Wt 176 lb 12.9 oz (80.2 kg)   SpO2 100%   BMI 30.35 kg/m   HEMODYNAMICS:    VENTILATOR SETTINGS: Vent Mode: CPAP;PSV FiO2 (%):  [25 %-40 %] 28 % PEEP:  [5 cmH20] 5 cmH20 Pressure Support:  [5 cmH20] 5 cmH20  INTAKE / OUTPUT: I/O last 3 completed shifts: In: 1909.7 [I.V.:510.2; NG/GT:1077.5; IV Piggyback:322] Out: 2085 [Urine:1660; Other:425]  PHYSICAL EXAMINATION: General: chronically ill appearing female in NAD Neuro: Awake, alert, communicates, follows commands HEENT: #6 trach c/d/i, R IJ HD cath  Cardiac: irregular Chest: non-labored, clear  Abd: soft, non tender Ext: 1+ generalized edema  Skin: no rashes  LABS:  BMET  Recent Labs Lab 10/09/15 0452 10/09/15 1551 10/10/15 0500  NA 136 139 139  K 3.4* 3.9 3.6  CL 102 104 103  CO2 25 27 27   BUN 81* 96* 106*  CREATININE 2.79* 3.17* 3.27*  GLUCOSE 123* 106* 108*   Electrolytes  Recent Labs Lab 10/08/15 0425 10/09/15 0452 10/09/15 1551 10/10/15 0500  CALCIUM 9.2 9.2 9.2 9.3  MG 2.4 2.2  --  2.0  PHOS 1.9* 1.1* 3.7 4.7*     CBC  Recent Labs Lab 10/08/15 0425 10/09/15 0452 10/10/15 0500  WBC 13.4* 12.7* 14.7*  HGB 8.1* 7.3* 7.4*  HCT 27.0* 23.8* 24.0*  PLT 184 168 202   Coag's  Recent Labs Lab 10/04/15 1616 10/05/15 0515 10/06/15 0402 10/09/15 0452 10/10/15 0500  APTT 94* 72* 65*  --   --   INR  --  1.19 1.19 1.40 1.48    Sepsis Markers No results for input(s): LATICACIDVEN, PROCALCITON, O2SATVEN in the last 168 hours.  ABG No results for input(s): PHART, PCO2ART,  PO2ART in the last 168 hours.   Liver Enzymes  Recent Labs Lab 10/06/15 0403  10/08/15 0425 10/09/15 0452 10/09/15 1551 10/10/15 0500  AST 25  --  20  --   --   --   ALT 181*  --  92*  --   --   --   ALKPHOS 109  --  109  --   --   --   BILITOT 2.0*  --  0.8  --   --   --   ALBUMIN 4.0  4.0  < > 2.8* 2.6* 2.8* 2.8*  < > = values in this interval not displayed.   Cardiac Enzymes No results for input(s): TROPONINI, PROBNP in the last 168 hours.  Glucose  Recent Labs Lab 10/09/15 0810 10/09/15 1125 10/09/15 1546 10/09/15 2058 10/10/15 0014 10/10/15 0437  GLUCAP 101* 151* 113* 106* 124* 117*   Imaging Dg Chest Port 1 View  Result Date: 10/10/2015 CLINICAL DATA:  Respiratory failure. EXAM: PORTABLE CHEST 1 VIEW COMPARISON:  10/08/2015. FINDINGS: Tracheostomy tube in satisfactory position. Right jugular catheter tip in the superior vena cava. Interval removal of the left jugular catheter. Nasogastric tube extending into the proximal stomach. Stable enlarged cardiac silhouette. Mildly improved bibasilar airspace opacity and linear density. Thoracic spine  and bilateral shoulder degenerative changes. Old left rib fractures. IMPRESSION: 1. Mildly improved left basilar atelectasis or pneumonia. 2. Mildly improved minimal right basilar atelectasis. 3. Stable cardiomegaly Electronically Signed   By: Claudie Revering M.D.   On: 10/10/2015 07:15     STUDIES:  2D echo 9/10 >> EF 99991111, mod MR, systolic function moderately reduced, PA peak pressure 30mm  CULTURES: Blood 9/10 >negative Sputum 9/10 > normal flora  MRSA 9/10 >negative  ANTIBIOTICS: Levaquin 9/10 > 9/16 Vancomycin 9/10 > 9/13  SIGNIFICANT EVENTS: 9/07 admit after fall. Had repair of right shoulder surgery. 9/10 PCCM consulted for hypotension, bradycardia. Intubated for airway.  Xarelto stopped. 9/12 Renal consulted, start CRRT 9/19 Family meeting >> limited resuscitation; off pressors 9/20 trach 9/21 stopped  precedex 9/23 off vent overnight, weaned to 28% ATC   LINES/TUBES: L IJ CVL 9/10 >> 9/22 ETT 9/1 1>> 9/20 Trach 6 cuffed (DF) 9/20 >> R IJ HD catheter 9/12 >>   DISCUSSION: 75 yo female had fall and developed Rt distal humerus fx.  Had repair on 9/07.  Developed bradycardia, hypotension, progressive renal failure >> likely from PNA, and medications (beta blocker/calcium channel blocker). Now s/p prolonged critical illness w/ weaning efforts c/b delirium, severe malnutrition & deconditioning. Her trach was placed 9/20.  Tolerating pressure support, and might be ready for trach collar trial soon.  PMHx of A fib on xarelto, HTN, HLD, Gout, Asthma, Anxiety.   ASSESSMENT / PLAN:  PULMONARY A: Acute hypoxic respiratory failure 2nd to HCAP, acute pulmonary edema. Hx of asthma. P:   Pressure support wean to trach collar as tolerated SLP consult for PMV use  F/u CXR intermittently Continue pulmicort. brovana, prn duoneb  CARDIOVASCULAR A:  Shock 2nd to sepsis from PNA and bradycardia from beta blocker/calcium channel blocker-->resolved. Chronic A fib - on anticoagulation Elevated troponin 2nd to demand ischemia. Moderate mitral regurgitation. Hx of HTN, HLD. P:  Transition from heparin to coumadin per pharmacy LFT's improved >>  lipitor restarted 9/22 Continue metoprolol   Nuclear stress test once recovered  RENAL A:   AKI 2nd to ischemia >> baseline creatinine 0.77 from 08/19/15. P:   Intermittent HD per renal  Trend BMP  Replace electrolytes as indicated  Will need to consider more permanent form of HD access   GASTROINTESTINAL A:   Shock liver 2nd to ischemia - resolved. Nutrition. P:   Tube feeds  Protonix for SUP NPO  HEMATOLOGIC A:   Anemia of critical illness. Mild thrombocytopenia. Coagulopathy - r/t shock liver >> resolved. P:  F/u CBC Heparin gtt with transition to coumadin per pharmacy   INFECTIOUS A:   Sepsis 2nd to HCAP >> completed Abx  9/16. P:   Monitor off Abx Trend wbc and fever curve   ENDOCRINE A:   Relative adrenal insufficiency - cortisol 14.3 from 09/28/15 >> off solu cortef. Hyperglycemia. P:   SSI while on tube feeds  NEUROLOGIC A:   Acute metabolic encephalopathy. Hx of anxiety. Hx of RLS. Deconditioning. P:   RASS goal: 0 PRN versed, fentanyl Continue celexa, mirapex Continue 1/2 dosing of her home xanax PT/OT  ORTHOPEDICS A: Rt humerus fracture. P: Per ortho  GOALS OF CARE A: Limited resuscitation. P: Family okay with trach and short term vent support Would not want SNF with long term HD and vent support No CPR/defibrillation  Transfer to 2c vent sdu bed when available.  Will need to discuss with family if they would want to consider LTAC placement at some  point.  No family available am 9/23.    Noe Gens, NP-C [facility] Pgr: [phone] or if no answer [phone] 10/10/2015, 8:01 AM

## 2015-10-11 ENCOUNTER — Inpatient Hospital Stay (HOSPITAL_COMMUNITY): Payer: Medicare Other

## 2015-10-11 LAB — HEPARIN LEVEL (UNFRACTIONATED): HEPARIN UNFRACTIONATED: 0.59 [IU]/mL (ref 0.30–0.70)

## 2015-10-11 LAB — RENAL FUNCTION PANEL
Albumin: 2.6 g/dL — ABNORMAL LOW (ref 3.5–5.0)
Anion gap: 12 (ref 5–15)
BUN: 112 mg/dL — AB (ref 6–20)
CALCIUM: 9.1 mg/dL (ref 8.9–10.3)
CHLORIDE: 100 mmol/L — AB (ref 101–111)
CO2: 29 mmol/L (ref 22–32)
CREATININE: 3.47 mg/dL — AB (ref 0.44–1.00)
GFR calc non Af Amer: 12 mL/min — ABNORMAL LOW (ref >60)
GFR, EST AFRICAN AMERICAN: 14 mL/min — AB (ref >60)
GLUCOSE: 184 mg/dL — AB (ref 65–99)
Phosphorus: 4.2 mg/dL (ref 2.5–4.6)
Potassium: 3.5 mmol/L (ref 3.5–5.1)
SODIUM: 141 mmol/L (ref 135–145)

## 2015-10-11 LAB — CBC
HEMATOCRIT: 24.8 % — AB (ref 36.0–46.0)
Hemoglobin: 7.6 g/dL — ABNORMAL LOW (ref 12.0–15.0)
MCH: 27.8 pg (ref 26.0–34.0)
MCHC: 30.6 g/dL (ref 30.0–36.0)
MCV: 90.8 fL (ref 78.0–100.0)
PLATELETS: 260 10*3/uL (ref 150–400)
RBC: 2.73 MIL/uL — ABNORMAL LOW (ref 3.87–5.11)
RDW: 17.8 % — AB (ref 11.5–15.5)
WBC: 18.1 10*3/uL — AB (ref 4.0–10.5)

## 2015-10-11 LAB — GLUCOSE, CAPILLARY
GLUCOSE-CAPILLARY: 135 mg/dL — AB (ref 65–99)
GLUCOSE-CAPILLARY: 172 mg/dL — AB (ref 65–99)
Glucose-Capillary: 161 mg/dL — ABNORMAL HIGH (ref 65–99)

## 2015-10-11 LAB — PROTIME-INR
INR: 1.82
Prothrombin Time: 21.3 seconds — ABNORMAL HIGH (ref 11.4–15.2)

## 2015-10-11 LAB — MAGNESIUM: Magnesium: 2 mg/dL (ref 1.7–2.4)

## 2015-10-11 MED ORDER — POTASSIUM CHLORIDE 20 MEQ/15ML (10%) PO SOLN
20.0000 meq | Freq: Once | ORAL | Status: AC
  Administered 2015-10-11: 20 meq
  Filled 2015-10-11: qty 15

## 2015-10-11 MED ORDER — WARFARIN SODIUM 5 MG PO TABS
5.0000 mg | ORAL_TABLET | Freq: Once | ORAL | Status: AC
  Administered 2015-10-11: 5 mg via ORAL
  Filled 2015-10-11: qty 1

## 2015-10-11 NOTE — Progress Notes (Signed)
Pt this morning refused mouth care and CBG. I spoke with patient and explained that it was important for me to do these things for her she still refused. Pt wants to be left alone. Will continue to assess and monitor pt closely.

## 2015-10-11 NOTE — Progress Notes (Signed)
.   ANTICOAGULATION CONSULT NOTE - Follow-up Consult  Pharmacy Consult for heparin/warfarin Indication: atrial fibrillation  Patient Measurements: Height: 5\' 4"  (162.6 cm) Weight: 179 lb 10.8 oz (81.5 kg) IBW/kg (Calculated) : 54.7 Heparin Dosing Weight: 76  Vital Signs: Temp: 98.8 F (37.1 C) (09/23 1948) Temp Source: Axillary (09/23 1948) BP: 135/61 (09/24 0500) Pulse Rate: 84 (09/24 0732)  Labs:  Recent Labs  10/09/15 0452 10/09/15 0828 10/09/15 1551 10/10/15 0500 10/10/15 0514 10/11/15 0500  HGB 7.3*  --   --  7.4*  --  7.6*  HCT 23.8*  --   --  24.0*  --  24.8*  PLT 168  --   --  202  --  260  LABPROT 17.3*  --   --  18.1*  --  21.3*  INR 1.40  --   --  1.48  --  1.82  HEPARINUNFRC  --  0.49  --   --  0.51 0.59  CREATININE 2.79*  --  3.17* 3.27*  --  3.47*    Estimated Creatinine Clearance: 14.2 mL/min (by C-G formula based on SCr of 3.47 mg/dL (H)).   Assessment: 45 yoF with h/o afib on Xarelto PTA (last dose 9/10). Transitioned to IV heparin in setting of acute shock liver and AKI. LFT trending down. Baseline INR 1.19 on 9/19. Pt to transition to Coumadin starting 9/21 with questionable recovery of renal function.   INR today remains SUBtherapeutic at 1.82 but HL therapeutic at 0.59. Hgb remains low but stable, pltc wnl with no bleeding issues reported on Aranesp qThurs.  Goal of Therapy:  INR 2-3 Heparin level 0.3-0.7   Plan:  - Coumadin 5 mg PO x 1 tonight  - Continue heparin at 1450 Units/hr  - Daily HL/INR/CBC - Monitor closely for s/s bleeding   Melburn Popper, PharmD Clinical Pharmacy Resident Pager: [phone] 10/11/15 7:48 AM

## 2015-10-11 NOTE — Progress Notes (Signed)
PULMONARY / CRITICAL CARE MEDICINE   Name: Cristina [name] MRN: [id number] DOB: [birth year]    ADMISSION DATE:  09/24/2015 CONSULTATION DATE:  09/27/15  REFERRING MD: Dr. Erlinda Hong  CHIEF COMPLAINT:  Fall   SUBJECTIVE:  RN reports pt refusing elements of care.  Choosing when she will interact with staff.  Pt states "I don't want any of this".    VITAL SIGNS: BP (!) 159/70 (BP Location: Right Leg)   Pulse 92   Temp 98.8 F (37.1 C) (Axillary)   Resp 17   Ht 5\' 4"  (1.626 m)   Wt 179 lb 10.8 oz (81.5 kg)   SpO2 100%   BMI 30.84 kg/m   HEMODYNAMICS:    VENTILATOR SETTINGS: FiO2 (%):  [28 %] 28 %  INTAKE / OUTPUT: I/O last 3 completed shifts: In: 1779 [I.V.:495.7; NG/GT:1283.3] Out: 2825 [Urine:2400; Other:425]  PHYSICAL EXAMINATION: General: chronically ill appearing female in NAD Neuro: Awake, alert, communicates, follows commands with persistent instruction - initially ignored provider HEENT: #6 trach c/d/i, sutures in place, R IJ HD cath  Cardiac: irregular Chest: non-labored, clear  Abd: soft, non tender Ext: 1+ generalized edema, R arm dressing with odor Skin: no rashes  LABS:  BMET  Recent Labs Lab 10/09/15 1551 10/10/15 0500 10/11/15 0500  NA 139 139 141  K 3.9 3.6 3.5  CL 104 103 100*  CO2 27 27 29   BUN 96* 106* 112*  CREATININE 3.17* 3.27* 3.47*  GLUCOSE 106* 108* 184*   Electrolytes  Recent Labs Lab 10/09/15 0452 10/09/15 1551 10/10/15 0500 10/11/15 0500  CALCIUM 9.2 9.2 9.3 9.1  MG 2.2  --  2.0 2.0  PHOS 1.1* 3.7 4.7* 4.2     CBC  Recent Labs Lab 10/09/15 0452 10/10/15 0500 10/11/15 0500  WBC 12.7* 14.7* 18.1*  HGB 7.3* 7.4* 7.6*  HCT 23.8* 24.0* 24.8*  PLT 168 202 260   Coag's  Recent Labs Lab 10/04/15 1616  10/05/15 0515 10/06/15 0402 10/09/15 0452 10/10/15 0500 10/11/15 0500  APTT 94*  --  72* 65*  --   --   --   INR  --   < > 1.19 1.19 1.40 1.48 1.82  < > = values in this interval not displayed.  Sepsis  Markers No results for input(s): LATICACIDVEN, PROCALCITON, O2SATVEN in the last 168 hours.  ABG No results for input(s): PHART, PCO2ART, PO2ART in the last 168 hours.   Liver Enzymes  Recent Labs Lab 10/06/15 0403  10/08/15 0425  10/09/15 1551 10/10/15 0500 10/11/15 0500  AST 25  --  20  --   --   --   --   ALT 181*  --  92*  --   --   --   --   ALKPHOS 109  --  109  --   --   --   --   BILITOT 2.0*  --  0.8  --   --   --   --   ALBUMIN 4.0  4.0  < > 2.8*  < > 2.8* 2.8* 2.6*  < > = values in this interval not displayed.   Cardiac Enzymes No results for input(s): TROPONINI, PROBNP in the last 168 hours.  Glucose  Recent Labs Lab 10/10/15 0842 10/10/15 1145 10/10/15 1655 10/10/15 1947 10/11/15 0023 10/11/15 0424  GLUCAP 124* 143* 113* 123* 135* 172*   Imaging Dg Chest Port 1 View  Result Date: 10/11/2015 CLINICAL DATA:  Acute resp failure EXAM: PORTABLE CHEST 1 VIEW  COMPARISON:  One day prior FINDINGS: Right internal jugular line is unchanged. Patient rotated left. Nasogastric tube extends beyond the inferior aspect of the film. Tracheostomy appropriately positioned. Numerous leads and wires project over the chest. Cardiomegaly accentuated by AP portable technique. Atherosclerosis in the transverse aorta. Probable left pleural effusion. No pneumothorax. Mild interstitial edema. Persistent left greater than right base airspace disease. IMPRESSION: No significant change since one day prior. Left worse than right base airspace disease with probable left pleural effusion. Cardiomegaly with mild interstitial edema. Electronically Signed   By: Abigail Miyamoto M.D.   On: 10/11/2015 07:19     STUDIES:  2D echo 9/10 >> EF 99991111, mod MR, systolic function moderately reduced, PA peak pressure 58mm  CULTURES: Blood 9/10 > negative Sputum 9/10 > normal flora  MRSA 9/10 > negative  ANTIBIOTICS: Levaquin 9/10 > 9/16 Vancomycin 9/10 > 9/13  SIGNIFICANT EVENTS: 9/07 admit after  fall. Had repair of right shoulder surgery. 9/10 PCCM consulted for hypotension, bradycardia. Intubated for airway.  Xarelto stopped. 9/12 Renal consulted, start CRRT 9/19 Family meeting >> limited resuscitation; off pressors 9/20 trach 9/21 stopped precedex 9/23 off vent overnight, weaned to 28% ATC   LINES/TUBES: L IJ CVL 9/10 >> 9/22 ETT 9/11 >> 9/20 Trach 6 cuffed (DF) 9/20 >> R IJ HD catheter 9/12 >>   DISCUSSION: 75 yo female had fall and developed Rt distal humerus fx.  Had repair on 9/07.  Developed bradycardia, hypotension, progressive renal failure >> likely from PNA, and medications (beta blocker/calcium channel blocker). Now s/p prolonged critical illness w/ weaning efforts c/b delirium, severe malnutrition & deconditioning. Her trach was placed 9/20.  Tolerating pressure support, and might be ready for trach collar trial soon.  PMHx of A fib on xarelto, HTN, HLD, Gout, Asthma, Anxiety.   ASSESSMENT / PLAN:  PULMONARY A: Acute hypoxic respiratory failure 2nd to HCAP, acute pulmonary edema. Hx of asthma. P:   Pressure support wean to trach collar as tolerated Plan for suture removal 9/22 -9/29 SLP consult for PMV use  F/u CXR intermittently Continue pulmicort. brovana, prn duoneb  CARDIOVASCULAR A:  Shock 2nd to sepsis from PNA and bradycardia from beta blocker/calcium channel blocker-->resolved. Chronic A fib - on anticoagulation Elevated troponin 2nd to demand ischemia. Moderate mitral regurgitation. Hx of HTN, HLD. P:  Coumadin per pharmacy LFT's improved >>  lipitor restarted 9/22 Continue metoprolol   Nuclear stress test once recovered  RENAL A:   AKI 2nd to ischemia >> baseline creatinine 0.77 from 08/19/15. P:   Intermittent HD per renal  Trend BMP  Replace electrolytes as indicated  Will need to consider more permanent form of HD access    GASTROINTESTINAL A:   Shock liver 2nd to ischemia - resolved. Nutrition. P:   Tube feeds  Protonix  for SUP NPO  HEMATOLOGIC A:   Anemia of critical illness. Mild thrombocytopenia. Coagulopathy - r/t shock liver >> resolved. P:  F/u CBC Coumadin per pharmacy   INFECTIOUS A:   Sepsis 2nd to HCAP >> completed Abx 9/16. P:   Monitor off Abx Trend wbc and fever curve   ENDOCRINE A:   Relative adrenal insufficiency - cortisol 14.3 from 09/28/15 >> off solu cortef. Hyperglycemia. P:   SSI while on tube feeds  NEUROLOGIC A:   Acute metabolic encephalopathy. Hx of anxiety. Hx of RLS. Deconditioning. P:   RASS goal: 0 PRN fentanyl Continue celexa, mirapex Continue 1/2 dosing of her home xanax PT/OT  ORTHOPEDICS A: Rt humerus  fracture. P: Per ortho Will ask ortho tech to change dressing - beginning to have odor   GOALS OF CARE A: Limited resuscitation. P: Family okay with trach and short term vent support Would not want SNF with long term HD and vent support No CPR/defibrillation Palliative Consulted 9/24 for ongoing goals of care.    Transfer to 2c vent sdu bed when available.  Will need to discuss with family if they would want to consider LTAC placement at some point.  No family available am 9/24.    Noe Gens, NP-C [facility] Pgr: [phone] or if no answer [phone] 10/11/2015, 8:55 AM

## 2015-10-11 NOTE — Progress Notes (Signed)
Received a phone call from Shanon Brow, pts son, he was very upset because his sisters had a palliative care meeting with them about comfort measures and moving to a palliative floor without him. He felt that the family should have included him and he was unaware of all of these changes. I stated that he needed to speak with his sisters about the family meeting and why he was not involved and I also stated that I would have the palliative care MD call him on his personal phone. He was very tearful but appreciative of the update. Paged palliative care and she stated she would call the son and explain to him the situation and answer any questions.   Patients daughter, Langley Gauss, later called the unit and spoke with me about the situation. She was very worried about her mother. She felt there was a "bad" situation happening with she and her siblings. She stated she "did not want Shanon Brow in the room by himself." She proceeded to explain that she spoke with Shanon Brow about a new meeting with palliative medicine tomorrow 9/25 at 1400 and she said he refused to come and was very angry with her. I stated that she just needed to be here with all her sisters and try to get him here or on the phone for the meeting. No decisions will be made to the patients care until the palliative care team meets with all children tomorrow.

## 2015-10-11 NOTE — Progress Notes (Signed)
Received new orders for PMSV evaluation. Unable to complete this date. Will f/u 9/25.   [place], [place] [phone]

## 2015-10-11 NOTE — Consult Note (Addendum)
Consultation Note Date: 10/11/2015   Patient Name: Cristina [name]  DOB: [birth year]  MRN: [id number]  Age / Sex: 75 y.o., female  PCP: Kathyrn Lass, MD Referring Physician: Rush Landmark, MD  Reason for Consultation: Establishing goals of care  HPI/Patient Profile: 75 y.o. female   admitted on 09/24/2015    Clinical Assessment and Goals of Care:  75 year old lady with a past medical history significant for atrial fibrillation-on anticoagulation with Xarelto in the outpatient setting, hypertension, dyslipidemia, gout, asthma, anxiety. Patient recently had an ankle fracture and was hospitalized. Patient unfortunately suffered another fall and fractured her right humerus. She has had unsteady gait and some physical/functional decline lately. After the ankle fracture [facility].  Patient was admitted to the hospital for right humerus fracture for which she underwent repair. Unfortunately, postoperative course was complicated by development of hypotension, bradycardia. Patient was intubated for airway protection. Additionally, she required CRRT short-term. Multiple family meetings were held by various providers throughout the course of this hospitalization. Initially it was decided for limited resuscitation measures. Pressors were weaned off. Patient required tracheostomy placement. Over the course of the past few days, patient was noted to have decent urine output and CRRT was discontinued on 9-20. It was established that the patient would not want CPR and would not want to be sent to a skilled nursing facility towards the end of this hospitalization. Patient remains with generalized distress/discomfort and has been voicing that she would not want current level of care. Hence, palliative consultation has been obtained  Patient is older than stated age appearing  chronically ill appearing lady resting in bed. She appears to have increased congestion. She does not verbalize or participate in discussions. Discussed with patient that I would meet with the patient's daughters outside the room. Met with daughters Cristina [name] and Cristina [name] outside the patient's room. Cristina [name]'s husband was also present for the family meeting. I introduced myself and palliative care as follows: Palliative medicine is specialized medical care for people living with serious illness. It focuses on providing relief from the symptoms and stress of a serious illness. The goal is to improve quality of life for both the patient and the family.  Discussed and reviewed patient's current course of hospitalization. Discussed that while the patient has had a complicated course of hospitalization, she was currently off CRRT not requiring vent support. Goals and wishes attempted to be elicited. Family initially agreed for time trial off the next 24-48 hours and repeat family meeting on Tuesday 9-26 -17. Versions answered. Patient's family members were given information on how to contact palliative service.  Subsequently, later this afternoon, I received call from patient's daughter Cristina [name] that their mother has wished to not undergo current efforts. She wished to be kept as comfortable as possible. Discussions then held over the phone with Cristina [name] regarding transfer to [facility], comfort measures only, no ceiling with hospice consultation by 9-25. She stated that she was agreeable and that this would best  represent the patient's previously expressed wishes. Additionally, call placed and discussed with another daughter Cristina [name] who also voiced that a mode of care that focuses exclusively on comfort at this time is what the patient would want as has been previously expressed by her on multiple occasions.  Received call from patient's bedside RN that patient's son Cristina [name] called the hospital and was reportedly upset at the  decision to proceed with comfort measures.   Call placed and attempted to reach David at [phone]. Unable to reach. Subsequently, call placed and discussed again with daughter Cristina [name] at [phone]. Since there is no established healthcare power of attorney, plan is now to have a family meeting and discuss with all of the patient's 4 children: Cristina [name] in a family meeting on 9-25 at 32. At that time, patient's oldest daughter Cristina [name] who lives in [place] will participate via the phone. There is no spouse. Additional discussions to be undertaken. See summary of recommendations below. Thank you for the consult.  NEXT OF KIN  There is no established healthcare power of attorney agent there are 2 daughters who make decisions for the patient and her primary caregivers. Cristina [name] at [phone], Cristina [name] at [phone]  SUMMARY [place] meeting for additional discussions and decision making scheduled for 10-12-15 at 1400 Further recommendations to follow.   Code Status/Advance Care Planning:  Limited code    Symptom Management:     Into new current measures Palliative Prophylaxis:   Delirium Protocol     Psycho-social/Spiritual:   Desire for further Chaplaincy support:no  Additional Recommendations: Education on Hospice  Prognosis:   Guarded   Discharge Planning: To Be Determined      Primary Diagnoses: Present on Admission: . Hypotension due to drugs   I have reviewed the medical record, interviewed the patient and family, and examined the patient. The following aspects are pertinent.  Past Medical History:  Diagnosis Date  . Anxiety   . Asthma   . Atrial fibrillation ([place])   . Dysrhythmia    PAF  . History of gout   . Hyperlipemia   . Hypertension   . Pneumonia ~ 2012  . Shortness of breath dyspnea    with exertion   Social History   Social History  . Marital status: Divorced    Spouse name: N/A  . Number of children: N/A    . Years of education: N/A   Occupational History  . Retired    Social History Main Topics  . Smoking status: Never Smoker  . Smokeless tobacco: Never Used  . Alcohol use Yes     Comment: 08/20/2015 "glass of wine maybe once/month"  . Drug use: No  . Sexual activity: Yes   Other Topics Concern  . None   Social History Narrative   Lives in [place] since 1982   Divorced   G6P4   daughters live close   Retired from [employer] smoker/dirnks occ wine         Family History  Problem Relation Age of Onset  . CAD  37    father-mulitple  . Emphysema      mother  . Diabetes Mellitus II      father   Scheduled Meds: . arformoterol  15 mcg Nebulization BID  . aspirin  81 mg Oral Daily  . atorvastatin  20 mg Oral q1800  . budesonide (PULMICORT) nebulizer solution  0.5 mg Nebulization BID  . chlorhexidine  15 mL Mouth  Rinse BID  . citalopram  20 mg Per Tube QHS  . darbepoetin (ARANESP) injection - NON-DIALYSIS  100 mcg Subcutaneous Q Thu-1800  . insulin aspart  0-20 Units Subcutaneous Q4H  . mouth rinse  15 mL Mouth Rinse 10 times per day  . metoprolol tartrate  50 mg Oral BID  . pantoprazole sodium  40 mg Per Tube Daily  . pramipexole  2 mg Oral QHS  . Vitamin D (Ergocalciferol)  50,000 Units Oral Q7 days  . warfarin  5 mg Oral ONCE-1800  . Warfarin - Pharmacist Dosing Inpatient   Does not apply q1800   Continuous Infusions: . feeding supplement (VITAL 1.5 CAL) 1,000 mL (10/11/15 1512)  . heparin 1,450 Units/hr (10/11/15 0934)   PRN Meds:.sodium chloride, acetaminophen (TYLENOL) oral liquid 160 mg/5 mL, ALPRAZolam, fentaNYL (SUBLIMAZE) injection, ipratropium-albuterol, [DISCONTINUED] ondansetron **OR** ondansetron (ZOFRAN) IV, polyethylene glycol, sorbitol Medications Prior to Admission:  Prior to Admission medications   Medication Sig Start Date End Date Taking? Authorizing Provider  ALPRAZolam (XANAX) 0.25 MG tablet Take 0.25 mg by mouth 3 (three) times  daily as needed for anxiety.  09/12/15  Yes Historical Provider, MD  atorvastatin (LIPITOR) 20 MG tablet Take 20 mg by mouth at bedtime.    Yes Historical Provider, MD  citalopram (CELEXA) 20 MG tablet Take 20 mg by mouth at bedtime.   Yes Historical Provider, MD  methocarbamol (ROBAXIN) 750 MG tablet Take 1 tablet (750 mg total) by mouth 2 (two) times daily as needed for muscle spasms. 08/19/15  Yes Leandrew Koyanagi, MD  metoprolol succinate (TOPROL-XL) 100 MG 24 hr tablet Take 100 mg by mouth at bedtime. Take with or immediately following a meal.   Yes Historical Provider, MD  naproxen sodium (ANAPROX) 220 MG tablet Take 440 mg by mouth 2 (two) times daily as needed (for pain).    Yes Historical Provider, MD  ondansetron (ZOFRAN) 4 MG tablet Take 1-2 tablets (4-8 mg total) by mouth every 8 (eight) hours as needed for nausea or vomiting. 08/19/15  Yes Naiping Ephriam Jenkins, MD  oxyCODONE-acetaminophen (PERCOCET) 5-325 MG tablet Take 1-2 tablets by mouth every 4 (four) hours as needed for severe pain. 09/15/15  Yes Sharlett Iles, MD  pramipexole (MIRAPEX) 1 MG tablet Take 2 mg by mouth at bedtime.    Yes Historical Provider, MD  verapamil (CALAN-SR) 240 MG CR tablet Take 240 mg by mouth at bedtime.   Yes Historical Provider, MD  Vitamin D, Ergocalciferol, (DRISDOL) 50000 units CAPS capsule Take 50,000 Units by mouth every 7 (seven) days. Pt takes on Tuesday.   Yes Historical Provider, MD  colchicine 0.6 MG tablet Take 0.6 mg by mouth 2 (two) times daily as needed (for gout flares).     Jacolyn Reedy, MD  oxyCODONE (OXY IR/ROXICODONE) 5 MG immediate release tablet Take 1-3 tablets (5-15 mg total) by mouth every 4 (four) hours as needed. 09/24/15   Leandrew Koyanagi, MD  oxyCODONE (OXYCONTIN) 10 mg 12 hr tablet Take 1 tablet (10 mg total) by mouth every 12 (twelve) hours. 09/24/15   Naiping Ephriam Jenkins, MD  rivaroxaban (XARELTO) 20 MG TABS tablet Take 1 tablet (20 mg total) by mouth daily with supper. 03/06/15   Lelon Perla,  MD  senna-docusate (SENOKOT S) 8.6-50 MG tablet Take 1 tablet by mouth at bedtime as needed. Patient not taking: Reported on 09/15/2015 08/19/15   Leandrew Koyanagi, MD   Allergies  Allergen Reactions  . Augmentin [Amoxicillin-Pot Clavulanate]  Anaphylaxis and Other (See Comments)    Has patient had a PCN reaction causing immediate rash, facial/tongue/throat swelling, SOB or lightheadedness with hypotension: Yes Has patient had a PCN reaction causing severe rash involving mucus membranes or skin necrosis: No Has patient had a PCN reaction that required hospitalization No Has patient had a PCN reaction occurring within the last 10 years: Yes If all of the above answers are "NO", then may proceed with Cephalosporin use.  [name] Kitchen Lisinopril Anaphylaxis and Cough   Review of Systems Non verbal  Physical Exam Chronically ill-appearing elderly lady resting in bed Patient opens eyes to voice command but does not interact much. S1-S2 is regular Shallow breath sounds patchy rhonchorous breath sounds in the anterior lung fields Right upper extremity with dressing Generalized edema Has trach site appears clean has a right IJ hemodialysis catheter  Vital Signs: BP (!) 152/76   Pulse 94   Temp 98.1 F (36.7 C) (Axillary)   Resp 16   Ht _0  (1.626 m)   Wt 81.5 kg (179 lb 10.8 oz)   SpO2 100%   BMI 30.84 kg/m  Pain Assessment: CPOT POSS *See Group Information*: 2-Acceptable,Slightly drowsy, easily aroused Pain Score: Asleep   SpO2: SpO2: 100 % O2 Device:SpO2: 100 % O2 Flow Rate: .O2 Flow Rate (L/min): 8 L/min  IO: Intake/output summary:  Intake/Output Summary (Last 24 hours) at 10/11/15 1527 Last data filed at 10/11/15 1200  Gross per 24 hour  Intake          1373.33 ml  Output             1700 ml  Net          -326.67 ml    LBM: Last BM Date: 10/10/15 Baseline Weight: Weight: 81.2 kg (179 lb) Most recent weight: Weight: 81.5 kg (179 lb 10.8 oz)     Palliative  Assessment/Data:   Flowsheet Rows   Flowsheet Row Most Recent Value  Intake Tab  Referral Department  Critical care  Unit at Time of Referral  ICU  Palliative Care Primary Diagnosis  Other (Comment)  Palliative Care Type  New Palliative care  Reason for referral  Clarify Goals of Care  Date first seen by Palliative Care  10/11/15  Clinical Assessment  Palliative Performance Scale Score  20%  Pain Max last 24 hours  4  Pain Min Last 24 hours  3  Dyspnea Max Last 24 Hours  4  Dyspnea Min Last 24 hours  3  Nausea Max Last 24 Hours  4  Nausea Min Last 24 Hours  3  Anxiety Max Last 24 Hours  4  Anxiety Min Last 24 Hours  3  Psychosocial & Spiritual Assessment  Palliative Care Outcomes  Patient/Family meeting held?  Yes  Who was at the meeting?  patient, 2 daughters and son in law   Palliative Care Outcomes  Clarified goals of care  Palliative Care follow-up planned  Yes, Facility      Time In:  1300 Time Out:  1430 Time Total:  90  Greater than 50%  of this time was spent counseling and coordinating care related to the above assessment and plan.  Signed by: Loistine Chance, MD  [phone]  Please contact Palliative Medicine Team phone at [phone] for questions and concerns.  For individual provider: See Shea Evans

## 2015-10-11 NOTE — Progress Notes (Addendum)
Patient ID: Cristina [name], female   DOB: [birth year], 75 y.o.   MRN: [id number]  [facility] Progress Note   Subjective:    Stopped CRRT  (9/20)  Not wanting to be bothered, refusing nursing care (except occasional turning and suctioning of trach) Told NP for CCM "don't want any of this" Fortunately making urine without diuretics TF's changed to get rid of the high protein TF's    Objective:   BP (!) 144/71 (BP Location: Right Leg)   Pulse 88   Temp 98.1 F (36.7 C) (Axillary)   Resp (!) 21   Ht 5\' 4"  (1.626 m)   Wt 81.5 kg (179 lb 10.8 oz)   SpO2 100%   BMI 30.84 kg/m   Intake/Output Summary (Last 24 hours) at 10/11/15 1301 Last data filed at 10/11/15 1200  Gross per 24 hour  Intake          1373.33 ml  Output             1700 ml  Net          -326.67 ml   Weight trending  10/07/15  79.9 kg CRRT discontinued  10/06/15 0406  81.5 kg (179 lb 10.8 oz)   10/05/15 0500  83.4 kg (183 lb 13.8 oz)   10/04/15 0400  84 kg (185 lb 3 oz)   10/03/15 0407  83.5 kg (184 lb 1.4 oz)   10/02/15 0341  86.8 kg (191 lb 5.8 oz)   10/01/15 0400  91.4 kg (201 lb 8 oz)   09/30/15 0400  93.3 kg (205 lb 11 oz)   09/29/15 0500  92.7 kg (204 lb 5.9 oz) CRRT initiated   Physical Exam: Central line out On trach collar Malodorous secretions in trach collar and coughing R IJ HD cath (9/12) Pulse irregular irreg, S1 and S2 normal Clear anteriorly Soft, obese, not distended, 1+ edema both LE's and UE's with pitting edema to the thighs Large amt urine clear yellow in foley  Imaging: Dg Chest Port 1 View  Result Date: 10/11/2015 CLINICAL DATA:  Acute resp failure EXAM: PORTABLE CHEST 1 VIEW COMPARISON:  One day prior FINDINGS: Right internal jugular line is unchanged. Patient rotated left. Nasogastric tube extends beyond the inferior aspect of the film. Tracheostomy appropriately positioned. Numerous leads and wires project over the chest. Cardiomegaly accentuated by AP  portable technique. Atherosclerosis in the transverse aorta. Probable left pleural effusion. No pneumothorax. Mild interstitial edema. Persistent left greater than right base airspace disease. IMPRESSION: No significant change since one day prior. Left worse than right base airspace disease with probable left pleural effusion. Cardiomegaly with mild interstitial edema. Electronically Signed   By: Abigail Miyamoto M.D.   On: 10/11/2015 07:19   Dg Chest Port 1 View  Result Date: 10/10/2015 CLINICAL DATA:  Respiratory failure. EXAM: PORTABLE CHEST 1 VIEW COMPARISON:  10/08/2015. FINDINGS: Tracheostomy tube in satisfactory position. Right jugular catheter tip in the superior vena cava. Interval removal of the left jugular catheter. Nasogastric tube extending into the proximal stomach. Stable enlarged cardiac silhouette. Mildly improved bibasilar airspace opacity and linear density. Thoracic spine and bilateral shoulder degenerative changes. Old left rib fractures. IMPRESSION: 1. Mildly improved left basilar atelectasis or pneumonia. 2. Mildly improved minimal right basilar atelectasis. 3. Stable cardiomegaly Electronically Signed   By: Claudie Revering M.D.   On: 10/10/2015 07:15   Labs:  Recent Labs Lab 10/07/15 1629 10/07/15 1741 10/08/15 0425 10/09/15 0452 10/09/15 1551 10/10/15 0500 10/11/15 0500  NA 140 139 138 136 139 139 141  K 3.8 4.2 4.4 3.4* 3.9 3.6 3.5  CL 106 103 105 102 104 103 100*  CO2 23 28 20* 25 27 27 29   GLUCOSE 160* 123* 123* 123* 106* 108* 184*  BUN 85* 24* 48* 81* 96* 106* 112*  CREATININE 3.34* 1.00 1.62* 2.79* 3.17* 3.27* 3.47*  CALCIUM 7.8* 9.2 9.2 9.2 9.2 9.3 9.1  PHOS 5.8* 2.3* 1.9* 1.1* 3.7 4.7* 4.2     Recent Labs Lab 10/08/15 0425 10/09/15 0452 10/10/15 0500 10/11/15 0500  WBC 13.4* 12.7* 14.7* 18.1*  HGB 8.1* 7.3* 7.4* 7.6*  HCT 27.0* 23.8* 24.0* 24.8*  MCV 91.2 88.1 89.6 90.8  PLT 184 168 202 260    Lab Results  Component Value Date   INR 1.82  10/11/2015   INR 1.48 10/10/2015   INR 1.40 10/09/2015    Iron/TIBC/Ferritin/ %Sat    Component Value Date/Time   IRON 49 10/01/2015 0830   TIBC 280 10/01/2015 0830   FERRITIN 1,292 (H) 10/01/2015 0830   IRONPCTSAT 18 10/01/2015 0830   Medications:    . arformoterol  15 mcg Nebulization BID  . aspirin  81 mg Oral Daily  . atorvastatin  20 mg Oral q1800  . budesonide (PULMICORT) nebulizer solution  0.5 mg Nebulization BID  . chlorhexidine  15 mL Mouth Rinse BID  . citalopram  20 mg Per Tube QHS  . darbepoetin (ARANESP) injection - NON-DIALYSIS  100 mcg Subcutaneous Q Thu-1800  . insulin aspart  0-20 Units Subcutaneous Q4H  . mouth rinse  15 mL Mouth Rinse 10 times per day  . metoprolol tartrate  50 mg Oral BID  . pantoprazole sodium  40 mg Per Tube Daily  . pramipexole  2 mg Oral QHS  . Vitamin D (Ergocalciferol)  50,000 Units Oral Q7 days  . warfarin  5 mg Oral ONCE-1800  . Warfarin - Pharmacist Dosing Inpatient   Does not apply q1800   Infusions . feeding supplement (VITAL 1.5 CAL) 1,000 mL (10/10/15 1514)  . heparin 1,450 Units/hr (10/11/15 0934)   Background: 75 yo female with AF, HTN, HLD, gout, asthma - s/p fall with R distal humerus fx, repaired on 09/24/15. Developed bradycardia, hypotension, progressive renal failure >>likely from PNA, and medications (beta blocker/calcium channel blocker). Resultant hypoxemia respiratory failure, shock liver. Baseline creatinine around 0.7. CRRT 09/29/15-10/07/15  for anuric renal failure, volume overload.(catheter placed 9/12 by CCM). Stopped 10/07/15.   Assessment/ Plan:    1. Acute kidney injury. Baseline creatinine 0.7.  Ischemic ATN secondary to profound/sustained hypotension/PNA.  Dialysis dependent  09/29/15 - 10/07/15 (CRRT - stopped 9/20).  CXR no edema, still with LE edema to thighs despite drop in vol from 92.7->78 kg. Rate of rise of creatinine slowing since stopping CRRT. BUN stable (no higher) with change in TF's/elimination  of prostat.  1. No indication for dialysis 2. If creatinine about same tomorrow and still making urine, will probably remove catheter (in since the 12th)  2. Hypoxic respiratory failure/sepsis/pneumonia:Finished levaquin for LLL PNA. S/p trach 9/20. Now on trach collar  3. S/p R humerus fx - op 09/24/15.  Open treatment internal fixation of right supracondylar humerus fracture with intracondylar extension.  Olecranon osteotomy and ORIF of olecranon with internal fixation.  Subcutaneous transposition of ulnar nerve at elbow  4. Anemia:Hb 7.4 stable past couple of days.  1. S/p Feraheme 510 IV  2. S/p Aranesp 100 9/21 (ordered for weekly)  5. AFib.  On IV  heparin/warfarinmetoprolol.  Eventual nuclear stress test.  Limited Code status (no CPR or defib)   Jamal Maes, MD [place] Pager 10/11/2015, 1:01 PM

## 2015-10-12 DIAGNOSIS — Z7189 Other specified counseling: Secondary | ICD-10-CM

## 2015-10-12 DIAGNOSIS — R410 Disorientation, unspecified: Secondary | ICD-10-CM

## 2015-10-12 LAB — CBC
HEMATOCRIT: 25.9 % — AB (ref 36.0–46.0)
HEMOGLOBIN: 7.8 g/dL — AB (ref 12.0–15.0)
MCH: 27.8 pg (ref 26.0–34.0)
MCHC: 30.1 g/dL (ref 30.0–36.0)
MCV: 92.2 fL (ref 78.0–100.0)
Platelets: 291 10*3/uL (ref 150–400)
RBC: 2.81 MIL/uL — AB (ref 3.87–5.11)
RDW: 18.1 % — ABNORMAL HIGH (ref 11.5–15.5)
WBC: 15.5 10*3/uL — AB (ref 4.0–10.5)

## 2015-10-12 LAB — RENAL FUNCTION PANEL
ALBUMIN: 2.5 g/dL — AB (ref 3.5–5.0)
Anion gap: 10 (ref 5–15)
BUN: 105 mg/dL — AB (ref 6–20)
CALCIUM: 9.1 mg/dL (ref 8.9–10.3)
CHLORIDE: 102 mmol/L (ref 101–111)
CO2: 30 mmol/L (ref 22–32)
CREATININE: 3.09 mg/dL — AB (ref 0.44–1.00)
GFR calc Af Amer: 16 mL/min — ABNORMAL LOW (ref >60)
GFR, EST NON AFRICAN AMERICAN: 14 mL/min — AB (ref >60)
Glucose, Bld: 158 mg/dL — ABNORMAL HIGH (ref 65–99)
PHOSPHORUS: 3.5 mg/dL (ref 2.5–4.6)
Potassium: 3.6 mmol/L (ref 3.5–5.1)
SODIUM: 142 mmol/L (ref 135–145)

## 2015-10-12 LAB — HEPARIN LEVEL (UNFRACTIONATED): Heparin Unfractionated: 0.49 IU/mL (ref 0.30–0.70)

## 2015-10-12 LAB — GLUCOSE, CAPILLARY
GLUCOSE-CAPILLARY: 148 mg/dL — AB (ref 65–99)
GLUCOSE-CAPILLARY: 142 mg/dL — AB (ref 65–99)
Glucose-Capillary: 164 mg/dL — ABNORMAL HIGH (ref 65–99)
Glucose-Capillary: 142 mg/dL — ABNORMAL HIGH (ref 65–99)
Glucose-Capillary: 115 mg/dL — ABNORMAL HIGH (ref 65–99)
Glucose-Capillary: 145 mg/dL — ABNORMAL HIGH (ref 65–99)
Glucose-Capillary: 159 mg/dL — ABNORMAL HIGH (ref 65–99)

## 2015-10-12 LAB — MAGNESIUM: MAGNESIUM: 1.8 mg/dL (ref 1.7–2.4)

## 2015-10-12 LAB — PROTIME-INR
INR: 2.17
PROTHROMBIN TIME: 24.5 s — AB (ref 11.4–15.2)

## 2015-10-12 MED ORDER — SODIUM CHLORIDE 0.9 % IV SOLN: INTRAVENOUS | Status: DC

## 2015-10-12 MED ORDER — METOPROLOL TARTRATE 50 MG PO TABS
75.0000 mg | ORAL_TABLET | Freq: Two times a day (BID) | ORAL | Status: DC
  Administered 2015-10-12 – 2015-10-14 (×5): 75 mg via ORAL
  Filled 2015-10-12 (×5): qty 1

## 2015-10-12 MED ORDER — MAGNESIUM SULFATE 2 GM/50ML IV SOLN
2.0000 g | Freq: Once | INTRAVENOUS | Status: AC
  Administered 2015-10-12: 2 g via INTRAVENOUS
  Filled 2015-10-12: qty 50

## 2015-10-12 MED ORDER — WARFARIN SODIUM 5 MG PO TABS
5.0000 mg | ORAL_TABLET | Freq: Once | ORAL | Status: AC
  Administered 2015-10-12: 5 mg via ORAL
  Filled 2015-10-12: qty 1

## 2015-10-12 NOTE — Progress Notes (Signed)
PULMONARY / CRITICAL CARE MEDICINE   Name: Cristina [name] MRN: [id number] DOB: 03-Jul-1940    ADMISSION DATE:  09/24/2015 CONSULTATION DATE:  09/27/15  REFERRING MD: Dr. Erlinda Hong  CHIEF COMPLAINT:  Fall   SUBJECTIVE:  Much more alert and interactive this AM and tolerating TC.   VITAL SIGNS: BP (!) 150/64   Pulse 86   Temp 98.1 F (36.7 C) (Oral)   Resp 18   Ht 5\' 4"  (1.626 m)   Wt 79.7 kg (175 lb 11.3 oz)   SpO2 99%   BMI 30.16 kg/m   HEMODYNAMICS:    VENTILATOR SETTINGS: FiO2 (%):  [28 %-30 %] 28 %  INTAKE / OUTPUT: I/O last 3 completed shifts: In: 2486.5 [I.V.:536.5; NG/GT:1950] Out: 2010 [Urine:2010]  PHYSICAL EXAMINATION: General: chronically ill appearing female in NAD Neuro: Awake, alert, communicates, follows commands HEENT: #6 trach c/d/i, sutures in place, R IJ HD cath  Cardiac: irregular Chest: non-labored, clear  Abd: soft, non tender Ext: 1+ generalized edema, R arm dressing with odor Skin: no rashes  LABS:  BMET  Recent Labs Lab 10/10/15 0500 10/11/15 0500 10/12/15 0219  NA 139 141 142  K 3.6 3.5 3.6  CL 103 100* 102  CO2 27 29 30   BUN 106* 112* 105*  CREATININE 3.27* 3.47* 3.09*  GLUCOSE 108* 184* 158*   Electrolytes  Recent Labs Lab 10/10/15 0500 10/11/15 0500 10/12/15 0219  CALCIUM 9.3 9.1 9.1  MG 2.0 2.0 1.8  PHOS 4.7* 4.2 3.5    CBC  Recent Labs Lab 10/10/15 0500 10/11/15 0500 10/12/15 0219  WBC 14.7* 18.1* 15.5*  HGB 7.4* 7.6* 7.8*  HCT 24.0* 24.8* 25.9*  PLT 202 260 291   Coag's  Recent Labs Lab 10/06/15 0402  10/10/15 0500 10/11/15 0500 10/12/15 0219  APTT 65*  --   --   --   --   INR 1.19  < > 1.48 1.82 2.17  < > = values in this interval not displayed.  Sepsis Markers No results for input(s): LATICACIDVEN, PROCALCITON, O2SATVEN in the last 168 hours.  ABG No results for input(s): PHART, PCO2ART, PO2ART in the last 168 hours.   Liver Enzymes  Recent Labs Lab 10/06/15 0403  10/08/15 0425   10/10/15 0500 10/11/15 0500 10/12/15 0219  AST 25  --  20  --   --   --   --   ALT 181*  --  92*  --   --   --   --   ALKPHOS 109  --  109  --   --   --   --   BILITOT 2.0*  --  0.8  --   --   --   --   ALBUMIN 4.0  4.0  < > 2.8*  < > 2.8* 2.6* 2.5*  < > = values in this interval not displayed.   Cardiac Enzymes No results for input(s): TROPONINI, PROBNP in the last 168 hours.  Glucose  Recent Labs Lab 10/11/15 0023 10/11/15 0424 10/11/15 1940 10/12/15 0100 10/12/15 0431 10/12/15 0758  GLUCAP 135* 172* 161* 164* 142* 115*   Imaging No results found.   STUDIES:  2D echo 9/10 >> EF [id number], mod MR, systolic function moderately reduced, PA peak pressure 62mm  CULTURES: Blood 9/10 > negative Sputum 9/10 > normal flora  MRSA 9/10 > negative  ANTIBIOTICS: Levaquin 9/10 > 9/16 Vancomycin 9/10 > 9/13  SIGNIFICANT EVENTS: 9/07 admit after fall. Had repair of right shoulder surgery. 9/10 PCCM  consulted for hypotension, bradycardia. Intubated for airway.  Xarelto stopped. 9/12 Renal consulted, start CRRT 9/19 Family meeting >> limited resuscitation; off pressors 9/20 trach 9/21 stopped precedex 9/23 off vent overnight, weaned to 28% ATC   LINES/TUBES: L IJ CVL 9/10 >> 9/22 ETT 9/11 >> 9/20 Trach 6 cuffed (DF) 9/20 >> R IJ HD catheter 9/12 >>   I reviewed CXR myself, trach is good position.  DISCUSSION: 75 yo female had fall and developed Rt distal humerus fx.  Had repair on 9/07.  Developed bradycardia, hypotension, progressive renal failure >> likely from PNA, and medications (beta blocker/calcium channel blocker). Now s/p prolonged critical illness w/ weaning efforts c/b delirium, severe malnutrition & deconditioning. Discussed with palliative care team, ongoing discussion regarding plan of care.  PMHx of A fib on xarelto, HTN, HLD, Gout, Asthma, Anxiety.   ASSESSMENT / PLAN:  PULMONARY A: Acute hypoxic respiratory failure 2nd to HCAP, acute pulmonary  edema. Hx of asthma. P:   Transition to TC as tolerated. D/C vent out of room. Plan for suture removal 9/29. SLP consult for PMV use per speech pathology F/u CXR intermittently Continue pulmicort. Brovana, prn duoneb.  CARDIOVASCULAR A:  Shock 2nd to sepsis from PNA and bradycardia from beta blocker/calcium channel blocker-->resolved. Chronic A fib - on anticoagulation Elevated troponin 2nd to demand ischemia. Moderate mitral regurgitation. Hx of HTN, HLD. P:  Coumadin per pharmacy LFT's improved >>  lipitor restarted 9/22 Continue metoprolol   Nuclear stress test once recovered  RENAL A:   AKI 2nd to ischemia >> baseline creatinine 0.77 from 08/19/15. P:   Intermittent HD per renal  Trend BMP  Replace electrolytes as indicated  Will need to consider more permanent form of HD access    GASTROINTESTINAL A:   Shock liver 2nd to ischemia - resolved. Nutrition. P:   Tube feeds  Protonix for SUP NPO until able to swallow per speech recommendations.  HEMATOLOGIC A:   Anemia of critical illness. Mild thrombocytopenia. Coagulopathy - r/t shock liver >> resolved. P:  F/u CBC Coumadin per pharmacy   INFECTIOUS A:   Sepsis 2nd to HCAP >> completed Abx 9/16. P:   Monitor off Abx Trend wbc and fever curve   ENDOCRINE A:   Relative adrenal insufficiency - cortisol 14.3 from 09/28/15 >> off solu cortef. Hyperglycemia. P:   SSI while on tube feeds  NEUROLOGIC A:   Acute metabolic encephalopathy. Hx of anxiety. Hx of RLS. Deconditioning. P:   RASS goal: 0 PRN fentanyl Continue celexa, mirapex Continue 1/2 dosing of her home xanax PT/OT  ORTHOPEDICS A: Rt humerus fracture. P: Per ortho  GOALS OF CARE A: Limited resuscitation. P: Spoke with palliative care, meeting today at 2 PM, patient was interactive but once topic of plan of care came up she stopped responding and refused to make decisions.  Rush Farmer, M.D. [facility] Pulmonary/Critical Care  Medicine. Pager: [phone]. After hours pager: [phone].

## 2015-10-12 NOTE — Progress Notes (Signed)
Orthopedic Tech Progress Note Patient Details:  Cristina [name] 04/18/1940 [id number]  Ortho Devices Type of Ortho Device: Arm sling Ortho Device/Splint Location: rue Ortho Device/Splint Interventions: Application   Cristina [name] 10/12/2015, 4:00 PM Removed splint; then applied arm sling

## 2015-10-12 NOTE — Progress Notes (Signed)
.   ANTICOAGULATION CONSULT NOTE - Follow-up Consult  Pharmacy Consult for heparin/warfarin Indication: atrial fibrillation  Patient Measurements: Height: 5\' 4"  (162.6 cm) Weight: 175 lb 11.3 oz (79.7 kg) IBW/kg (Calculated) : 54.7 Heparin Dosing Weight: 76  Vital Signs: Temp: 98.1 F (36.7 C) (09/25 0400) Temp Source: Oral (09/25 0400) BP: 173/76 (09/25 0600) Pulse Rate: 90 (09/25 0600)  Labs:  Recent Labs  10/10/15 0500 10/10/15 0514 10/11/15 0500 10/12/15 0219  HGB 7.4*  --  7.6* 7.8*  HCT 24.0*  --  24.8* 25.9*  PLT 202  --  260 291  LABPROT 18.1*  --  21.3* 24.5*  INR 1.48  --  1.82 2.17  HEPARINUNFRC  --  0.51 0.59 0.49  CREATININE 3.27*  --  3.47* 3.09*    Estimated Creatinine Clearance: 15.8 mL/min (by C-G formula based on SCr of 3.09 mg/dL (H)).   Assessment: 19 yoF with h/o afib on Xarelto PTA (last dose 9/10). Transitioned to IV heparin in setting of acute shock liver and AKI. LFT trending down. Baseline INR 1.19 on 9/19. Pt to transition to Coumadin starting 9/21 given questionable recovery of renal function.   INR therapeutic at 2.17 today and HL therapeutic at 0.49. Hgb remains low but stable, pltc wnl with no bleeding issues noted on Aranesp qThurs. Continue heparin bridge until INR therapeutic one more day.   Goal of Therapy:  INR 2-3 Heparin level 0.3-0.7   Plan:  - Coumadin 5 mg PO x 1 tonight  - Continue heparin at 1450 Units/hr  - Daily HL, INR, CBC - Monitor closely for s/s bleeding   Arrie Senate, PharmD PGY-1 Pharmacy Resident Pager: [phone] 10/12/2015

## 2015-10-12 NOTE — Progress Notes (Signed)
Daily Progress Note   Patient Name: Cristina [name]       Date: 10/12/2015 DOB: [birth year]  Age: 75 y.o. MRN#: [id number] Attending Physician: Thurnell Lose, MD Primary Care Physician: Tawanna Solo, MD Admit Date: 09/24/2015  Reason for Consultation/Follow-up: Establishing goals of care  75 yo female PMHx of A fib on xarelto, HTN, HLD, Gout, Asthma, Anxiety. had fall and developed Rt distal humerus fx.  Had repair on 9/07.  Developed bradycardia, hypotension, progressive renal failure >> likely from PNA, and medications (beta blocker/calcium channel blocker). Now s/p prolonged critical illness and delirium, severe malnutrition & deconditioning. She had a trach placed 9/20 now tolerating pressure support with trach collar.   Overall appears frail, critical care involved palliative care, currently partial code, she is still somnolent unable to once a questions appropriately, palliative care consulted for goals of care.     Subjective:  patient seen earlier this am. She was quite awake alert, had purposeful interactive gaze. She even was attempting to voice/mouth words.   This afternoon, she appears lethargic, does not open eyes, does not follow commands.   Length of Stay: 16  Current Medications: Scheduled Meds:  . arformoterol  15 mcg Nebulization BID  . aspirin  81 mg Oral Daily  . atorvastatin  20 mg Oral q1800  . budesonide (PULMICORT) nebulizer solution  0.5 mg Nebulization BID  . chlorhexidine  15 mL Mouth Rinse BID  . citalopram  20 mg Per Tube QHS  . darbepoetin (ARANESP) injection - NON-DIALYSIS  100 mcg Subcutaneous Q Thu-1800  . insulin aspart  0-20 Units Subcutaneous Q4H  . mouth rinse  15 mL Mouth Rinse 10 times per day  . metoprolol tartrate  75 mg Oral BID  .  pantoprazole sodium  40 mg Per Tube Daily  . pramipexole  2 mg Oral QHS  . Vitamin D (Ergocalciferol)  50,000 Units Oral Q7 days  . warfarin  5 mg Oral ONCE-1800  . Warfarin - Pharmacist Dosing Inpatient   Does not apply q1800    Continuous Infusions: . feeding supplement (VITAL 1.5 CAL) 1,000 mL (10/12/15 0700)    PRN Meds: sodium chloride, acetaminophen (TYLENOL) oral liquid 160 mg/5 mL, ALPRAZolam, fentaNYL (SUBLIMAZE) injection, ipratropium-albuterol, [DISCONTINUED] ondansetron **OR** ondansetron (ZOFRAN) IV, polyethylene glycol, sorbitol  Physical Exam  General: chronically ill appearing female in NAD Neuro: Awake, alert, communicates, follows commands HEENT: #6 trach c/d/i, sutures in place, R IJ HD cath  Cardiac: irregular Chest: non-labored, clear  Abd: soft, non tender Ext: 1+ generalized edema, R arm dressing with odor Skin: no rashes  Vital Signs: BP (!) 183/85   Pulse 86   Temp 98.1 F (36.7 C) (Oral)   Resp 16   Ht _0  (1.626 m)   Wt 79.7 kg (175 lb 11.3 oz)   SpO2 99%   BMI 30.16 kg/m  SpO2: SpO2: 99 % O2 Device: O2 Device: Tracheostomy Collar O2 Flow Rate: O2 Flow Rate (L/min): 5 L/min  Intake/output summary:  Intake/Output Summary (Last 24 hours) at 10/12/15 1420 Last data filed at 10/12/15 1100  Gross per 24 hour  Intake           1425.5 ml  Output             1135 ml  Net            290.5 ml   LBM: Last BM Date: 10/12/15 Baseline Weight: Weight: 81.2 kg (179 lb) Most recent weight: Weight: 79.7 kg (175 lb 11.3 oz)       Palliative Assessment/Data:    Flowsheet Rows   Flowsheet Row Most Recent Value  Intake Tab  Referral Department  Critical care  Unit at Time of Referral  ICU  Palliative Care Primary Diagnosis  Other (Comment)  Palliative Care Type  New Palliative care  Reason for referral  Clarify Goals of Care  Date first seen by Palliative Care  10/11/15  Clinical Assessment  Palliative Performance Scale Score  20%  Pain  Max last 24 hours  4  Pain Min Last 24 hours  3  Dyspnea Max Last 24 Hours  4  Dyspnea Min Last 24 hours  3  Nausea Max Last 24 Hours  4  Nausea Min Last 24 Hours  3  Anxiety Max Last 24 Hours  4  Anxiety Min Last 24 Hours  3  Psychosocial & Spiritual Assessment  Palliative Care Outcomes  Patient/Family meeting held?  Yes  Who was at the meeting?  patient, 2 daughters and son in law   Palliative Care Outcomes  Clarified goals of care  Palliative Care follow-up planned  Yes, Facility      Patient Active Problem List   Diagnosis Date Noted  . Status post tracheostomy ([place])   . Surgery, elective   . History of ETT   . Acute hypoxemic respiratory failure ([place])   . Pressure ulcer 10/03/2015  . Persistent atrial fibrillation ([place])   . Encounter for central line placement   . Elevated troponin   . Orthostatic hypotension   . Hypotension due to drugs 09/27/2015  . Bradycardia   . Acute respiratory failure ([place])   . S/P ORIF (open reduction internal fixation) fracture 08/19/2015  . Anaphylactic reaction 01/23/2014  . Essential hypertension 08/07/2013  . Atrial fibrillation ([place]) 07/28/2013  . Hypertensive heart disease 07/28/2013  . Chronic diastolic congestive heart failure ([place]) 07/28/2013  . Obesity (BMI 30-39.9)   . Gout   . Current use of long term anticoagulation   . Acute diastolic CHF (congestive heart failure) ([place]) 07/24/2013    Palliative Care Assessment & Plan   Patient Profile:    Assessment:  recent R humerus Fx s/p repair Recent VDRF s/p trach.  Recent renal failure, now off CRRT Deconditioning, generalized   Recommendations/Plan:    Family  meeting held with son Shanon Brow in the room earlier this morning, then met with 2 daughters Langley Gauss and Kenney Houseman in the room, then also discussed with daughter Lesleigh Noe over the phone:  PLAN: continue with current mode of care, to go to step down, to attempt trach down size, passy muir valve trial, speech pathology eval,  continue tube feeds for now. All 4 kids don't want patient to undergo PEG tube, no dialysis.  Continue current scope of care for at least the next 2-3 days and then re assess. Plan may include comfort care and hospice discussions if patient does not improve much in the next few days.  Palliative will continue to follow along and help guide decision making.   Code Status:    Code Status Orders        Start     Ordered   10/06/15 1500  Limited resuscitation (code)  Continuous    Question Answer Comment  In the event of cardiac or respiratory ARREST: Perform CPR No   In the event of cardiac or respiratory ARREST: Perform Intubation/Mechanical Ventilation Yes   In the event of cardiac or respiratory ARREST: Perform Defibrillation or Cardioversion if indicated No   Antiarrhythmic drugs - Any drug used to treat arrhythmias No   Vasoactive drug - Any drug used to stabilize blood pressure Yes      10/06/15 1459    Code Status History    Date Active Date Inactive Code Status Order ID Comments User Context   10/06/2015  2:51 PM 10/06/2015  2:59 PM DNR [id number]  Erick Colace, NP Inpatient   09/27/2015  3:08 PM 10/06/2015  2:51 PM Full Code [id number]  Rush Landmark, MD Inpatient   09/24/2015  6:51 PM 09/27/2015  3:08 PM Full Code [id number]  Leandrew Koyanagi, MD Inpatient   08/19/2015  3:20 PM 08/21/2015  6:19 PM Full Code [id number]  Leandrew Koyanagi, MD Inpatient   01/24/2014 12:19 AM 01/24/2014  6:16 PM Full Code [id number]  Rhetta Mura Schorr, NP Inpatient   01/23/2014  7:50 PM 01/24/2014 12:19 AM DNR [id number]  Nita Sells, MD Inpatient   07/22/2013 11:50 PM 07/28/2013  1:52 PM Full Code [id number]  Lonn Georgia, PA-C Inpatient       Prognosis:   guarded  Discharge Planning:  To Be Determined  Care plan was discussed with  Patient, son, 3 daughters, RN, Dr Nelda Marseille.   Thank you for allowing the Palliative Medicine Team to assist in the care of this patient.   Time In: 9, 1400 Time Out:  935, 1435 Total Time 70 min  Prolonged Time Billed  yes        Greater than 50%  of this time was spent counseling and coordinating care related to the above assessment and plan.  Loistine Chance, MD [phone]  Please contact Palliative Medicine Team phone at [phone] for questions and concerns.

## 2015-10-12 NOTE — Evaluation (Signed)
Physical Therapy Re-Evaluation Patient Details Name: Cristina [name] MRN: [id number] DOB: 1940-11-06 Today's Date: 10/12/2015   History of Present Illness  Pt admitted for ORIF of R elbow. Pt sustained a fx in a fall on 09/15/15. Pt transferred to ICU 9/10 with hypotension, bradycardia and acute respiratory failure, required intubation. Lurline Idol 9/20 as well as CRRT. PMH: recent L ankle fx with ORIF 8/2 with ongoing NWB status, pt had been at [facility] for rehab and discharged home with daughter, afib, asthma, anxiety, CHF, HTN, back surgery.   Clinical Impression  Pt with flat affect and limited attempts at mouthing words or responses throughout session. Son, Shanon Brow present throughout and both agreeable to mobility and attempts toward improved function. Pt with maintained knee extension throughout session and unclear if volitional or increased tone. Pt with decreased strength, function and mobility who will benefit from acute therapy to maximize mobility, function and balance to decrease burden of care.   Pt maintained 92-94% on RA throughout session, returned to 28% trach collar end of session    Follow Up Recommendations SNF;Supervision/Assistance - 24 hour    Equipment Recommendations  Hospital bed    Recommendations for Other Services       Precautions / Restrictions Precautions Precautions: Fall Precaution Comments: watch sats Required Braces or Orthoses: Sling Restrictions Weight Bearing Restrictions: Yes RUE Weight Bearing: Non weight bearing RLE Weight Bearing: Weight bearing as tolerated LLE Weight Bearing: Non weight bearing      Mobility  Bed Mobility Overal bed mobility: +2 for physical assistance;Needs Assistance Bed Mobility: Supine to Sit;Sit to Supine;Rolling Rolling: +2 for physical assistance;Total assist   Supine to sit: +2 for physical assistance;Total assist Sit to supine: +2 for physical assistance;Total assist   General bed mobility comments: total assist  to rotate trunk and pelvis with rolling x 3, total assist with pad and helicopter technique to pivot to and from EOB  Transfers                 General transfer comment: not attempted  Ambulation/Gait                Stairs            Wheelchair Mobility    Modified Rankin (Stroke Patients Only)       Balance Overall balance assessment: Needs assistance Sitting-balance support: Feet supported;Single extremity supported Sitting balance-Leahy Scale: Poor Sitting balance - Comments: requirin min to mod assist with L side lean and posterior tendency Postural control: Posterior lean Standing balance support: Single extremity supported                                 Pertinent Vitals/Pain Pain Assessment: Faces Faces Pain Scale: Hurts even more Pain Location: sacrum, RUE, LLE, neck Pain Descriptors / Indicators: Grimacing;Guarding Pain Intervention(s): Monitored during session;Repositioned    Home Living Family/patient expects to be discharged to:: Unsure Living Arrangements: Children Available Help at Discharge: Family;Available 24 hours/day Type of Home: House Home Access: Stairs to enter Entrance Stairs-Rails: None Entrance Stairs-Number of Steps: 3 Home Layout: One level Home Equipment: Bedside commode;Walker - 2 wheels;Cane - single point;Wheelchair - manual Additional Comments: Pt was staying with her daughter prior to admission.    Prior Function Level of Independence: Needs assistance   Gait / Transfers Assistance Needed:  1 person assist for stand pivot transfers, non ambulatory-using w/c. Family reports that the patient has been minimally active.  ADL's / Homemaking Assistance Needed: dependent with dressing and bathing        Hand Dominance   Dominant Hand: Right    Extremity/Trunk Assessment   Upper Extremity Assessment: Defer to OT evaluation RUE Deficits / Details: splinted MPs>proximal to elbow, edematous hand with  minimal finger AROM RUE: Unable to fully assess due to immobilization   LUE Deficits / Details: generalized weakness   Lower Extremity Assessment: Generalized weakness;RLE deficits/detail RLE Deficits / Details: pt maintaining knee extension bil despite AAROm and even EOB to bend knees. hips also with limited abduction bil  LLE Deficits / Details: pt maintaining knee extension bil despite AAROm and even EOB to bend knees. hips also with limited abduction bil   Cervical / Trunk Assessment: Kyphotic  Communication   Communication: Tracheostomy  Cognition Arousal/Alertness: Awake/alert Behavior During Therapy: Flat affect Overall Cognitive Status: Impaired/Different from baseline Area of Impairment: Orientation;Following commands Orientation Level: Disoriented to;Time   Memory: Decreased short-term memory Following Commands: Follows one step commands with increased time;Follows multi-step commands consistently            General Comments      Exercises Other Exercises Other Exercises: retrograde massage, A/PROM R hand   Assessment/Plan    PT Assessment Patient needs continued PT services  PT Problem List Decreased strength;Decreased activity tolerance;Decreased balance;Decreased mobility;Decreased range of motion;Pain;Decreased skin integrity          PT Treatment Interventions DME instruction;Functional mobility training;Therapeutic activities;Therapeutic exercise;Patient/family education;Wheelchair mobility training    PT Goals (Current goals can be found in the Care Plan section)  Acute Rehab PT Goals Patient Stated Goal: be able to move better PT Goal Formulation: With patient/family Time For Goal Achievement: 10/26/15 Potential to Achieve Goals: Fair    Frequency Min 3X/week   Barriers to discharge Decreased caregiver support      Co-evaluation   Reason for Co-Treatment: Complexity of the patient's impairments (multi-system involvement);For  patient/therapist safety   OT goals addressed during session: Strengthening/ROM       End of Session   Activity Tolerance: Patient tolerated treatment well Patient left: in bed;with call bell/phone within reach;with family/visitor present;with nursing/sitter in room Nurse Communication: Mobility status;Precautions;Need for lift equipment         Time: 0924-1006 PT Time Calculation (min) (ACUTE ONLY): 42 min   Charges:   PT Evaluation $PT Re-evaluation: 1 Procedure PT Treatments $Therapeutic Activity: 8-22 mins   PT G Codes:        Melford Aase 10/12/2015, 10:50 AM Elwyn Reach, [place]

## 2015-10-12 NOTE — Progress Notes (Signed)
Trach Collar, tubing, and water bottle changed per protocol. RT will continue to monitor.

## 2015-10-12 NOTE — Progress Notes (Signed)
Subjective: Interval History: has no complaint, but minimally interactive.  Objective: Vital signs in last 24 hours: Temp:  [97.6 F (36.4 C)-98.1 F (36.7 C)] 98.1 F (36.7 C) (09/25 0400) Pulse Rate:  [80-99] 90 (09/25 0600) Resp:  [13-24] 15 (09/25 0700) BP: (120-184)/(57-89) 150/64 (09/25 0700) SpO2:  [97 %-100 %] 100 % (09/25 0600) FiO2 (%):  [28 %-30 %] 28 % (09/25 0400) Weight:  [79.7 kg (175 lb 11.3 oz)] 79.7 kg (175 lb 11.3 oz) (09/25 0427) Weight change: -1.8 kg (-3 lb 15.5 oz)  Intake/Output from previous day: 09/24 0701 - 09/25 0700 In: 1677 [I.V.:377; NG/GT:1300] Out: 1235 [Urine:1235] Intake/Output this shift: No intake/output data recorded.  General appearance: moderately obese, pale and below Neck: moderately obese, pale and no interactive but coop,  and moves eyes and acknowledges Resp: diminished breath sounds bilaterally and rales bibasilar Cardio: S1, S2 normal and systolic murmur: holosystolic 2/6, blowing at apex GI: obese,pos bs,liver down 4 cm Extremities: edema 2-3+ including presacral  Lab Results:  Recent Labs  10/11/15 0500 10/12/15 0219  WBC 18.1* 15.5*  HGB 7.6* 7.8*  HCT 24.8* 25.9*  PLT 260 291   BMET:  Recent Labs  10/11/15 0500 10/12/15 0219  NA 141 142  K 3.5 3.6  CL 100* 102  CO2 29 30  GLUCOSE 184* 158*  BUN 112* 105*  CREATININE 3.47* 3.09*  CALCIUM 9.1 9.1   No results for input(s): PTH in the last 72 hours. Iron Studies: No results for input(s): IRON, TIBC, TRANSFERRIN, FERRITIN in the last 72 hours.  Studies/Results: Dg Chest Port 1 View  Result Date: 10/11/2015 CLINICAL DATA:  Acute resp failure EXAM: PORTABLE CHEST 1 VIEW COMPARISON:  One day prior FINDINGS: Right internal jugular line is unchanged. Patient rotated left. Nasogastric tube extends beyond the inferior aspect of the film. Tracheostomy appropriately positioned. Numerous leads and wires project over the chest. Cardiomegaly accentuated by AP portable  technique. Atherosclerosis in the transverse aorta. Probable left pleural effusion. No pneumothorax. Mild interstitial edema. Persistent left greater than right base airspace disease. IMPRESSION: No significant change since one day prior. Left worse than right base airspace disease with probable left pleural effusion. Cardiomegaly with mild interstitial edema. Electronically Signed   By: Abigail Miyamoto M.D.   On: 10/11/2015 07:19    I have reviewed the patient's current medications.  Assessment/Plan: 1 AKI stable GFR, vol xs.  K and acid/base ok.  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   2 Resp failure down to track colla 3 pneu resolved 4 Obesity 5 Arm Fx 6 Afib anticoag, rate control P PC discussions, follow chem,     LOS: 16 days   Jhoselyn Ruffini L 10/12/2015,7:30 AM

## 2015-10-12 NOTE — Evaluation (Signed)
Occupational Therapy Re-Evaluation Patient Details Name: Cristina [name] MRN: [id number] DOB: [birth year] Today's Date: 10/12/2015    History of Present Illness Pt admitted for ORIF of R elbow. Pt sustained a fx in a fall on 09/15/15. Pt transferred to ICU 9/10 with hypotension, bradycardia and acute respiratory failure, required intubation and trach 9/20 as well as CRRT. PMH: recent L ankle fx with ORIF with ongoing NWB status, pt had been at [facility] for rehab and discharged home with daughter, afib, asthma, anxiety, CHF, HTN, back surgery.    Clinical Impression   Pt agreeable to work with therapies. Presents with profound weakness, requiring 2 person assist for all mobility and total assist for ADL. Tolerated EOB x 10 minutes with min to moderate assist. Maintains LEs extended in sitting. Indicates pain with movement including ROM of neck and R digits. Son in room throughout visit encouraging pt. Pt with minimal attempts to converse, but following commands with increased time. Will follow acutely.    Follow Up Recommendations  SNF;Supervision/Assistance - 24 hour    Equipment Recommendations       Recommendations for Other Services       Precautions / Restrictions Precautions Precautions: Fall Precaution Comments: watch sats Restrictions Weight Bearing Restrictions: Yes RUE Weight Bearing: Non weight bearing LLE Weight Bearing: Non weight bearing      Mobility Bed Mobility Overal bed mobility: +2 for physical assistance;Needs Assistance Bed Mobility: Supine to Sit;Sit to Supine;Rolling Rolling: +2 for physical assistance;Total assist   Supine to sit: +2 for physical assistance;Total assist Sit to supine: +2 for physical assistance;Total assist   General bed mobility comments: pivoted with pad to EOB, rolled for pericare  Transfers                 General transfer comment: not attempted    Balance Overall balance assessment: Needs  assistance Sitting-balance support: Feet supported;Single extremity supported Sitting balance-Leahy Scale: Poor Sitting balance - Comments: requirin min to mod assist with L side lean                                    ADL Overall ADL's : Needs assistance/impaired Eating/Feeding: NPO                                     General ADL Comments: total assist for bathing, dressing, grooming and pericare     Vision Additional Comments: eyes closed most of session   Perception     Praxis      Pertinent Vitals/Pain Pain Assessment: Faces Faces Pain Scale: Hurts even more Pain Location: neck, R UE, L LE Pain Descriptors / Indicators: Grimacing;Guarding Pain Intervention(s): Monitored during session;Repositioned     Hand Dominance Right   Extremity/Trunk Assessment Upper Extremity Assessment Upper Extremity Assessment: RUE deficits/detail;LUE deficits/detail RUE Deficits / Details: splinted MPs>proximal to elbow, edematous hand with minimal finger AROM RUE: Unable to fully assess due to immobilization RUE Coordination: decreased fine motor;decreased gross motor LUE Deficits / Details: generalized weakness LUE Coordination: decreased fine motor;decreased gross motor   Lower Extremity Assessment Lower Extremity Assessment: Defer to PT evaluation   Cervical / Trunk Assessment Cervical / Trunk Assessment: Other exceptions Cervical / Trunk Exceptions: head turned to L, able to passively turn to R, but with pt indicating pain, positioned pt on R side with pillow for  neutral head position and instructed son to speak to pt from R   Communication Communication Communication: Tracheostomy   Cognition Arousal/Alertness: Awake/alert (although eyes closed most of session) Behavior During Therapy: Flat affect Overall Cognitive Status: Impaired/Different from baseline Area of Impairment: Orientation;Following commands Orientation Level: Disoriented to;Time    Memory: Decreased short-term memory Following Commands: Follows one step commands with increased time;Follows multi-step commands consistently           General Comments       Exercises   Other Exercises Other Exercises: retrograde massage, A/PROM R hand   Shoulder Instructions      Home Living Family/patient expects to be discharged to:: Unsure Living Arrangements: Children Available Help at Discharge: Family;Available 24 hours/day Type of Home: House Home Access: Stairs to enter [employer] of Steps: 3 Entrance Stairs-Rails: None Home Layout: One level     Bathroom Shower/Tub: Tub/shower unit Shower/tub characteristics: Architectural technologist: Standard Bathroom Accessibility: No   Home Equipment: Bedside commode;Walker - 2 wheels;Cane - single point;Wheelchair - manual   Additional Comments: Pt was staying with her daughter prior to admission.      Prior Functioning/Environment Level of Independence: Needs assistance  Gait / Transfers Assistance Needed:  1 person assist for stand pivot transfers, non ambulatory-using w/c. Family reports that the patient has been minimally active. ADL's / Homemaking Assistance Needed: dependent with dressing and bathing            OT Problem List: Decreased strength;Decreased range of motion;Decreased activity tolerance;Impaired balance (sitting and/or standing);Decreased coordination;Decreased cognition;Decreased knowledge of precautions;Obesity;Pain;Impaired UE functional use;Increased edema;Decreased knowledge of use of DME or AE;Cardiopulmonary status limiting activity   OT Treatment/Interventions: Self-care/ADL training;Therapeutic exercise;Patient/family education;Therapeutic activities    OT Goals(Current goals can be found in the care plan section) Acute Rehab OT Goals Patient Stated Goal: be able to move better OT Goal Formulation: With patient Time For Goal Achievement: 10/26/15 Potential to Achieve  Goals: Fair ADL Goals Pt Will Transfer to Toilet: with +2 assist;with mod assist;bedside commode;stand pivot transfer Pt/caregiver will Perform Home Exercise Program: With minimal assist;Both right and left upper extremity Additional ADL Goal #3: Pt will sit EOB x 10 minutes with supervision in preparation for ADL./  OT Frequency: Min 2X/week   Barriers to D/C:            Co-evaluation PT/OT/SLP Co-Evaluation/Treatment: Yes Reason for Co-Treatment: Complexity of the patient's impairments (multi-system involvement);For patient/therapist safety   OT goals addressed during session: Strengthening/ROM      End of Session Nurse Communication: Mobility status;Need for lift equipment  Activity Tolerance: Patient limited by pain;Patient limited by fatigue Patient left: in bed;with call bell/phone within reach;with family/visitor present   Time: [id number] OT Time Calculation (min): 43 min Charges:  OT General Charges $OT Visit: 1 Procedure OT Evaluation $OT Re-eval: 1 Procedure G-Codes:    Malka So 10/12/2015, 10:35 AM  [phone]

## 2015-10-12 NOTE — Progress Notes (Signed)
Orthopedic Tech Progress Note Patient Details:  Cristina [name] September 25, 1940 [id number]  Ortho Devices Type of Ortho Device: Arm sling Ortho Device/Splint Location: rue Ortho Device/Splint Interventions: Application   Hildred Priest 10/12/2015, 4:01 PM Removed splint and then applied arm sling; viewed order from doctor's order list

## 2015-10-12 NOTE — Evaluation (Signed)
[facility] Speaking Valve - Evaluation Patient Details  Name: Cristina [name] MRN: [id number] Date of Birth: 06/30/1940  Today's Date: 10/12/2015 Time: 1045-1100 SLP Time Calculation (min) (ACUTE ONLY): 15 min  Past Medical History:  Past Medical History:  Diagnosis Date  . Anxiety   . Asthma   . Atrial fibrillation ([place])   . Dysrhythmia    PAF  . History of gout   . Hyperlipemia   . Hypertension   . Pneumonia ~ 2012  . Shortness of breath dyspnea    with exertion   Past Surgical History:  Past Surgical History:  Procedure Laterality Date  . ANKLE FRACTURE SURGERY Right 2000s  . BACK SURGERY    . FRACTURE SURGERY    . [place] SURGERY  1990s  . ORIF ANKLE FRACTURE Left 08/19/2015   Procedure: OPEN REDUCTION INTERNAL FIXATION (ORIF) LEFT ANKLE FRACTURE, POSSIBLE SYNDESMOSIS;  Surgeon: Leandrew Koyanagi, MD;  Location: [place];  Service: Orthopedics;  Laterality: Left;  . ORIF HUMERUS FRACTURE Right 09/24/2015   Procedure: OPEN REDUCTION INTERNAL FIXATION (ORIF) RIGHT DISTAL HUMERUS FRACTURE;  Surgeon: Leandrew Koyanagi, MD;  Location: [place];  Service: Orthopedics;  Laterality: Right;  . TONSILLECTOMY AND ADENOIDECTOMY  1950s   HPI:  Pt admitted for ORIF of R elbow. Pt sustained a fx in a fall on 09/15/15. Pt transferred to ICU 9/10 with hypotension, bradycardia and acute respiratory failure, required intubation. Cristina [name] 9/20 as well as CRRT. Family in discussions with palliative care regarding plan of care. PMH: recent L ankle fx with ORIF 8/2 with ongoing NWB status, pt had been at [facility] for rehab and discharged home with daughter, afib, asthma, anxiety, CHF, HTN, back surgery.    Assessment / Plan / Recommendation Clinical Impression  PMSV evaluation complete. Valve placed for average 10 seconds intervals x 4-5 trials. Pt with subtle-no upper airway patency. SLP provided max cueing to attempt to clear upper airway with cough/throat clear to r/o impact of secretions on valve function  without patient attempt to participate. Discussed results with patient's son and palliative care MD who were present for evaluation. Potential causes of poor upper airway patency include upper airway secretions, edema, and/or size of tracheostomy tube. SLP will continue efforts at bedside.     SLP Assessment  Patient needs continued Speech Lanaguage Pathology Services    Follow Up Recommendations   (TBD)    Frequency and Duration min 3x week  2 weeks    PMSV Trial PMSV was placed for: 10 seconds Able to redirect subglottic air through upper airway: No Able to Attain Phonation: No Able to Expectorate Secretions: No Respirations During Trial: 20 SpO2 During Trial: 99 % Pulse During Trial: 99 Behavior: Lethargic;No attempt to communicate   Tracheostomy Tube  Additional Tracheostomy Tube Assessment Fenestrated: No Trach Collar Period: 24 hours Secretion Description: moderate Level of Secretion Expectoration: Tracheal    Vent Dependency  FiO2 (%): 28 %    Cuff Deflation Trial  GO Tolerated Cuff Deflation: Yes Length of Time for Cuff Deflation Trial: deflated prior to session Behavior:  (lethargic, decreased clinician interaction)      Cristina Rainwater MA, CCC-SLP [phone]    Cristina [name] Cristina [name] 10/12/2015, 11:34 AM

## 2015-10-12 NOTE — Progress Notes (Addendum)
PROGRESS NOTE                                                                                                                                                                                                             Patient Demographics:    Cristina [name], is a 75 y.o. female, DOB - 1940-12-08, [id number]  Admit date - 09/24/2015   Admitting Physician Naiping Ephriam Jenkins, MD  Outpatient Primary MD for the patient is Tawanna Solo, MD  LOS - 16  No chief complaint on file.      Brief Narrative   75 yo female PMHx of A fib on xarelto, HTN, HLD, Gout, Asthma, Anxiety. had fall and developed Rt distal humerus fx. Had repair on 9/07. Developed bradycardia, hypotension, progressive renal failure >>likely from PNA, and medications (beta blocker/calcium channel blocker). Now s/p prolonged critical illness and delirium, severe malnutrition & deconditioning. She had a trach placed 9/20 now tolerating pressure support with trach collar.  Overall appears frail, critical care involved palliative care, currently partial code, she is still somnolent unable to once a questions appropriately, she is due for a palliative care meeting for goals of care on 10/12/2015.    Subjective:    Cristina [name] today In bed, somnolent, unable to once a questions reliably.   Assessment  & Plan :     1. Acute hypoxic respiratory failure 2nd to HCAP, acute pulmonary edema with history of asthma. She was on prolonged ventilator, eventually underwent tracheostomy placement on 10/07/2015, currently weaned down to trach collar since 10/10/2015, she has finished her antibiotic treatment, has been diuresed in the past, currently on supportive care with trach collar and nebulizer treatments along with trach site care per protocol.  2. Shock due to combination of sepsis and beta blocker/calcium channel blocker induced bradycardia. Resolved, had mild  troponin elevation under the care of critical care due to demand ischemia not any serious pattern, seen by cardiology, echocardiogram noted with EF of 55% and chronic diastolic CHF, currently on aspirin, statin and moderate dose beta blocker and stable. Shock has resolved.  3. Chronic atrial fibrillation [place] vasc 2 score of at least 4 - on beta blocker, heparin drip/Coumadin for anticoagulation, per cardiology stress test once medically stable. In by cardiology this admission.  4. ARF. ATN secondary to shock and ischemia. Underwent dialysis and  CRRT this admission, renal following, currently CRRT has been held. We'll defer management of this problem to nephrology.  5. Always right humerus fracture. Status post surgical correction by Dr. Erlinda Hong, right arm is under Bandage defer management to orthopedics.  6. Anemia of chronic disease along with anemia of critical care illness. Monitor, no indication for transfusion.  7. Encephalopathy. Critical care delirium, metabolic encephalopathy, also generalized weakness and deconditioning, appears frail, agree with palliative care involvement for long-term goals of care. Discussed with radical care physician Dr. Lake Bells on 10/12/2015.  8. Relative adrenal insufficiency earlier in admission while she was in shock. Has finished steroid support.  9. Shock liver due to hypotension. Liver function has considerably improved and almost completely normalized. We'll monitor CMP.    Family Communication  :  None  Code Status :  partial  Diet : VIA NG  Disposition Plan  :  Step down  Consults  :  PCCM, Pall care, Cards, Renal  Procedures  :   SIGNIFICANT EVENTS: 9/07 admit after fall. Had repair of right shoulder surgery. 9/10 PCCM consulted for hypotension, bradycardia. Intubated for airway.  Xarelto stopped. 9/12 Renal consulted, start CRRT 9/19 Family meeting >> limited resuscitation; off pressors 9/20 trach 9/21 stopped precedex 9/23 off vent  overnight, weaned to 28% ATC   LINES/TUBES: L IJ CVL 9/10 >> 9/22 ETT 9/11 >> 9/20 Trach 6 cuffed (DF) 9/20 >> R IJ HD catheter 9/12 >>  TTE 09-27-15 - EF 55%, dCHF   DVT Prophylaxis  :   Heparin gtt  Lab Results  Component Value Date   PLT 291 10/12/2015    Inpatient Medications  Scheduled Meds: . arformoterol  15 mcg Nebulization BID  . aspirin  81 mg Oral Daily  . atorvastatin  20 mg Oral q1800  . budesonide (PULMICORT) nebulizer solution  0.5 mg Nebulization BID  . chlorhexidine  15 mL Mouth Rinse BID  . citalopram  20 mg Per Tube QHS  . darbepoetin (ARANESP) injection - NON-DIALYSIS  100 mcg Subcutaneous Q Thu-1800  . insulin aspart  0-20 Units Subcutaneous Q4H  . mouth rinse  15 mL Mouth Rinse 10 times per day  . metoprolol tartrate  50 mg Oral BID  . pantoprazole sodium  40 mg Per Tube Daily  . pramipexole  2 mg Oral QHS  . Vitamin D (Ergocalciferol)  50,000 Units Oral Q7 days  . warfarin  5 mg Oral ONCE-1800  . Warfarin - Pharmacist Dosing Inpatient   Does not apply q1800   Continuous Infusions: . feeding supplement (VITAL 1.5 CAL) 1,000 mL (10/12/15 0700)  . heparin 1,450 Units/hr (10/12/15 0700)   PRN Meds:.sodium chloride, acetaminophen (TYLENOL) oral liquid 160 mg/5 mL, ALPRAZolam, fentaNYL (SUBLIMAZE) injection, ipratropium-albuterol, [DISCONTINUED] ondansetron **OR** ondansetron (ZOFRAN) IV, polyethylene glycol, sorbitol  Antibiotics  :    Anti-infectives    Start     Dose/Rate Route Frequency Ordered Stop   10/01/15 0000  vancomycin (VANCOCIN) IVPB 1000 mg/200 mL premix  Status:  Discontinued     1,000 mg 200 mL/hr over 60 Minutes Intravenous Every 24 hours 09/30/15 1143 10/01/15 0626   09/30/15 2100  vancomycin (VANCOCIN) IVPB 1000 mg/200 mL premix  Status:  Discontinued     1,000 mg 200 mL/hr over 60 Minutes Intravenous Every 24 hours 09/29/15 1955 09/30/15 1143   09/30/15 1800  Levofloxacin (LEVAQUIN) IVPB 250 mg     250 mg 50 mL/hr over 60  Minutes Intravenous Every 24 hours 09/29/15 1942 10/03/15 1858  09/29/15 1758  levofloxacin (LEVAQUIN) IVPB 750 mg  Status:  Discontinued     750 mg 100 mL/hr over 90 Minutes Intravenous Every 48 hours 09/28/15 1030 09/29/15 0754   09/29/15 1758  levofloxacin (LEVAQUIN) IVPB 500 mg  Status:  Discontinued     500 mg 100 mL/hr over 60 Minutes Intravenous Every 48 hours 09/29/15 0754 09/29/15 1943   09/29/15 0656  vancomycin (VANCOCIN) IVPB 1000 mg/200 mL premix  Status:  Discontinued     1,000 mg 200 mL/hr over 60 Minutes Intravenous Every 24 hours 09/28/15 1039 09/29/15 0751   09/28/15 0600  vancomycin (VANCOCIN) IVPB 750 mg/150 ml premix  Status:  Discontinued     750 mg 150 mL/hr over 60 Minutes Intravenous Every 12 hours 09/27/15 1542 09/28/15 1039   09/27/15 1600  levofloxacin (LEVAQUIN) IVPB 750 mg  Status:  Discontinued     750 mg 100 mL/hr over 90 Minutes Intravenous Every 24 hours 09/27/15 1540 09/28/15 1030   09/27/15 1545  vancomycin (VANCOCIN) 1,500 mg in sodium chloride 0.9 % 500 mL IVPB     1,500 mg 250 mL/hr over 120 Minutes Intravenous  Once 09/27/15 1541 09/27/15 1958   09/25/15 0000  vancomycin (VANCOCIN) IVPB 1000 mg/200 mL premix     1,000 mg 200 mL/hr over 60 Minutes Intravenous Every 12 hours 09/24/15 1851 09/25/15 1459   09/24/15 1015  vancomycin (VANCOCIN) IVPB 1000 mg/200 mL premix     1,000 mg 200 mL/hr over 60 Minutes Intravenous  Once 09/24/15 1004 09/24/15 1155   09/24/15 1006  vancomycin (VANCOCIN) 1-5 GM/200ML-% IVPB    Comments:  Leandrew Koyanagi   : cabinet override      09/24/15 1006 09/24/15 2214         Objective:   Vitals:   10/12/15 0500 10/12/15 0600 10/12/15 0700 10/12/15 0749  BP: (!) 160/74 (!) 173/76 (!) 150/64 (!) 150/64  Pulse: 85 90  86  Resp: 13 16 15 18   Temp:      TempSrc:      SpO2: 100% 100%  99%  Weight:      Height:        Wt Readings from Last 3 Encounters:  10/12/15 79.7 kg (175 lb 11.3 oz)  09/15/15 81.2 kg (179  lb)  08/19/15 77.1 kg (170 lb)     Intake/Output Summary (Last 24 hours) at 10/12/15 1032 Last data filed at 10/12/15 0700  Gross per 24 hour  Intake           1454.5 ml  Output             1110 ml  Net            344.5 ml     Physical Exam  Patient in bed, somnolent, unable to reliably follow commands or answer questions [place].AT,PERRAL Supple Neck,No JVD, No cervical lymphadenopathy appriciated. , Trach site stable, NG tube in place, Symmetrical Chest wall movement, Good air movement bilaterally, CTAB RRR,No Gallops,Rubs or new Murmurs, No Parasternal Heave +ve B.Sounds, Abd Soft, No tenderness, No organomegaly appriciated, No rebound - guarding or rigidity. No Cyanosis, Clubbing or edema, No new Rash or bruise, Right arm and a bandage,    Data Review:    CBC  Recent Labs Lab 10/08/15 0425 10/09/15 0452 10/10/15 0500 10/11/15 0500 10/12/15 0219  WBC 13.4* 12.7* 14.7* 18.1* 15.5*  HGB 8.1* 7.3* 7.4* 7.6* 7.8*  HCT 27.0* 23.8* 24.0* 24.8* 25.9*  PLT 184 168 202 260 291  MCV 91.2 88.1 89.6 90.8 92.2  MCH 27.4 27.0 27.6 27.8 27.8  MCHC 30.0 30.7 30.8 30.6 30.1  RDW 17.2* 17.1* 17.6* 17.8* 18.1*    Chemistries   Recent Labs Lab 10/06/15 0403  10/08/15 0425 10/09/15 0452 10/09/15 1551 10/10/15 0500 10/11/15 0500 10/12/15 0219  NA 137  < > 138 136 139 139 141 142  K 4.2  < > 4.4 3.4* 3.9 3.6 3.5 3.6  CL 102  < > 105 102 104 103 100* 102  CO2 29  < > 20* 25 27 27 29 30   GLUCOSE 119*  < > 123* 123* 106* 108* 184* 158*  BUN 20  < > 48* 81* 96* 106* 112* 105*  CREATININE 0.94  < > 1.62* 2.79* 3.17* 3.27* 3.47* 3.09*  CALCIUM 9.3  < > 9.2 9.2 9.2 9.3 9.1 9.1  MG 2.4  < > 2.4 2.2  --  2.0 2.0 1.8  AST 25  --  20  --   --   --   --   --   ALT 181*  --  92*  --   --   --   --   --   ALKPHOS 109  --  109  --   --   --   --   --   BILITOT 2.0*  --  0.8  --   --   --   --   --   < > = values in this interval not  displayed. ------------------------------------------------------------------------------------------------------------------ No results for input(s): CHOL, HDL, LDLCALC, TRIG, CHOLHDL, LDLDIRECT in the last 72 hours.  Lab Results  Component Value Date   HGBA1C 6.0 (H) 07/23/2013   ------------------------------------------------------------------------------------------------------------------ No results for input(s): TSH, T4TOTAL, T3FREE, THYROIDAB in the last 72 hours.  Invalid input(s): FREET3 ------------------------------------------------------------------------------------------------------------------ No results for input(s): VITAMINB12, FOLATE, FERRITIN, TIBC, IRON, RETICCTPCT in the last 72 hours.  Coagulation profile  Recent Labs Lab 10/06/15 0402 10/09/15 0452 10/10/15 0500 10/11/15 0500 10/12/15 0219  INR 1.19 1.40 1.48 1.82 2.17    No results for input(s): DDIMER in the last 72 hours.  Cardiac Enzymes No results for input(s): CKMB, TROPONINI, MYOGLOBIN in the last 168 hours.  Invalid input(s): CK ------------------------------------------------------------------------------------------------------------------    Component Value Date/Time   BNP 208.1 (H) 01/23/2014 1220    Micro Results No results found for this or any previous visit (from the past 240 hour(s)).  Radiology Reports  Dg Elbow 2 Views Right  Result Date: 09/24/2015 CLINICAL DATA:  ORIF. EXAM: RIGHT ELBOW - 2 VIEW; DG C-ARM GT 120 MIN COMPARISON:  CT 09/15/2015. FINDINGS: ORIF distal humeral fracture. Hardware intact. Anatomic alignment. ORIF proximal ulna . Hardware intact. Anatomic alignment. Two images obtained. 0 minutes 23 seconds fluoroscopy time. IMPRESSION: Postsurgical changes about the right elbow . Electronically Signed   By: Marcello Moores  Register   On: 09/24/2015 16:19     Ct 3d Recon At Scanner  Result Date: 09/16/2015 CLINICAL DATA:  Patient fell wall today at home. Right elbow  fractures. EXAM: CT OF THE RIGHT ELBOW WITHOUT CONTRAST; 3-DIMENSIONAL CT IMAGE RENDERING ON ACQUISITION WORKSTATION TECHNIQUE: Multidetector CT imaging was performed according to the standard protocol. Multiplanar CT image reconstructions were also generated. COMPARISON:  Right elbow radiographs 09/15/2015 FINDINGS: Comminuted fractures demonstrated of the distal right humerus. Oblique fracture line begins in the distal shaft and extends towards the intercondylar region. There is mild posterior displacement and overlap of the distal fracture fragments. Transverse fracture lines extend to the medial  and lateral epicondyles. Condylar fragments are displaced and distracted. Impaction of the condylar fragments into the distal humerus. The ulnar and radial joints appear preserved without evidence of dislocation at the joints. Fracture lines do not appear to extend to the articular surfaces. Soft tissue swelling and effusion are present. Incidental note of a fracture of the lateral right ribs which could be acute. IMPRESSION: Comminuted fractures of the distal right humerus involving the distal shaft and intercondylar regions. Impaction and distraction of the condylar fragments. Posterior angulation and displacement of the distal shaft fragment. Fracture lines do not appear to be intraarticular and there is no evidence of dislocation of the joint. Incidental note of a right lateral rib fracture which could be acute. Electronically Signed   By: Lucienne Capers M.D.   On: 09/16/2015 01:02   Dg Chest Port 1 View  Result Date: 10/11/2015 CLINICAL DATA:  Acute resp failure EXAM: PORTABLE CHEST 1 VIEW COMPARISON:  One day prior FINDINGS: Right internal jugular line is unchanged. Patient rotated left. Nasogastric tube extends beyond the inferior aspect of the film. Tracheostomy appropriately positioned. Numerous leads and wires project over the chest. Cardiomegaly accentuated by AP portable technique. Atherosclerosis in  the transverse aorta. Probable left pleural effusion. No pneumothorax. Mild interstitial edema. Persistent left greater than right base airspace disease. IMPRESSION: No significant change since one day prior. Left worse than right base airspace disease with probable left pleural effusion. Cardiomegaly with mild interstitial edema. Electronically Signed   By: Abigail Miyamoto M.D.   On: 10/11/2015 07:19     Dg Humerus Right  Result Date: 09/15/2015 CLINICAL DATA:  Pain after trauma EXAM: RIGHT HUMERUS - 2+ VIEW COMPARISON:  None. FINDINGS: The distal humeral fracture is again evident, extending into the intercondylar region. This is seen to better advantage on the accompanying elbow radiographs. The remainder of the humerus is intact. IMPRESSION: Distal humeral fracture with mild apex -anterior angulation. Remainder of the humerus is intact. Electronically Signed   By: Andreas Newport M.D.   On: 09/15/2015 21:44   Dg C-arm Gt 120 Min  Result Date: 09/24/2015 CLINICAL DATA:  ORIF. EXAM: RIGHT ELBOW - 2 VIEW; DG C-ARM GT 120 MIN COMPARISON:  CT 09/15/2015. FINDINGS: ORIF distal humeral fracture. Hardware intact. Anatomic alignment. ORIF proximal ulna . Hardware intact. Anatomic alignment. Two images obtained. 0 minutes 23 seconds fluoroscopy time. IMPRESSION: Postsurgical changes about the right elbow . Electronically Signed   By: Marcello Moores  Register   On: 09/24/2015 16:19    Time Spent in minutes  30   Lala Lund K M.D on 10/12/2015 at 10:32 AM  Between 7am to 7pm - Pager - [phone]  After 7pm go to [email or web address] - password [facility]  [facility] -  Office  [phone]

## 2015-10-13 DIAGNOSIS — Z7189 Other specified counseling: Secondary | ICD-10-CM

## 2015-10-13 DIAGNOSIS — G9340 Encephalopathy, unspecified: Secondary | ICD-10-CM

## 2015-10-13 DIAGNOSIS — J9601 Acute respiratory failure with hypoxia: Secondary | ICD-10-CM

## 2015-10-13 DIAGNOSIS — Z515 Encounter for palliative care: Secondary | ICD-10-CM

## 2015-10-13 LAB — POCT I-STAT 3, ART BLOOD GAS (G3+)
ACID-BASE EXCESS: 10 mmol/L — AB (ref 0.0–2.0)
Bicarbonate: 34 mmol/L — ABNORMAL HIGH (ref 20.0–28.0)
O2 SAT: 95 %
PH ART: 7.489 — AB (ref 7.350–7.450)
PO2 ART: 72 mmHg — AB (ref 83.0–108.0)
TCO2: 35 mmol/L (ref 0–100)
pCO2 arterial: 44.7 mmHg (ref 32.0–48.0)

## 2015-10-13 LAB — RENAL FUNCTION PANEL
ALBUMIN: 2.6 g/dL — AB (ref 3.5–5.0)
Anion gap: 11 (ref 5–15)
BUN: 92 mg/dL — AB (ref 6–20)
CALCIUM: 9.6 mg/dL (ref 8.9–10.3)
CHLORIDE: 105 mmol/L (ref 101–111)
CO2: 31 mmol/L (ref 22–32)
CREATININE: 2.56 mg/dL — AB (ref 0.44–1.00)
GFR, EST AFRICAN AMERICAN: 20 mL/min — AB (ref >60)
GFR, EST NON AFRICAN AMERICAN: 17 mL/min — AB (ref >60)
Glucose, Bld: 145 mg/dL — ABNORMAL HIGH (ref 65–99)
PHOSPHORUS: 3.4 mg/dL (ref 2.5–4.6)
Potassium: 3.4 mmol/L — ABNORMAL LOW (ref 3.5–5.1)
Sodium: 147 mmol/L — ABNORMAL HIGH (ref 135–145)

## 2015-10-13 LAB — BLOOD GAS, ARTERIAL

## 2015-10-13 LAB — COMPREHENSIVE METABOLIC PANEL
ALT: 46 U/L (ref 14–54)
AST: 27 U/L (ref 15–41)
Albumin: 2.6 g/dL — ABNORMAL LOW (ref 3.5–5.0)
Alkaline Phosphatase: 117 U/L (ref 38–126)
Anion gap: 10 (ref 5–15)
BILIRUBIN TOTAL: 0.8 mg/dL (ref 0.3–1.2)
BUN: 92 mg/dL — ABNORMAL HIGH (ref 6–20)
CHLORIDE: 106 mmol/L (ref 101–111)
CO2: 31 mmol/L (ref 22–32)
Calcium: 9.6 mg/dL (ref 8.9–10.3)
Creatinine, Ser: 2.55 mg/dL — ABNORMAL HIGH (ref 0.44–1.00)
GFR, EST AFRICAN AMERICAN: 20 mL/min — AB (ref >60)
GFR, EST NON AFRICAN AMERICAN: 17 mL/min — AB (ref >60)
Glucose, Bld: 144 mg/dL — ABNORMAL HIGH (ref 65–99)
POTASSIUM: 3.5 mmol/L (ref 3.5–5.1)
Sodium: 147 mmol/L — ABNORMAL HIGH (ref 135–145)
TOTAL PROTEIN: 5.2 g/dL — AB (ref 6.5–8.1)

## 2015-10-13 LAB — CBC
HCT: 26.5 % — ABNORMAL LOW (ref 36.0–46.0)
HEMOGLOBIN: 8 g/dL — AB (ref 12.0–15.0)
MCH: 28 pg (ref 26.0–34.0)
MCHC: 30.2 g/dL (ref 30.0–36.0)
MCV: 92.7 fL (ref 78.0–100.0)
PLATELETS: 328 10*3/uL (ref 150–400)
RBC: 2.86 MIL/uL — ABNORMAL LOW (ref 3.87–5.11)
RDW: 19.5 % — AB (ref 11.5–15.5)
WBC: 14.9 10*3/uL — ABNORMAL HIGH (ref 4.0–10.5)

## 2015-10-13 LAB — GLUCOSE, CAPILLARY
GLUCOSE-CAPILLARY: 151 mg/dL — AB (ref 65–99)
GLUCOSE-CAPILLARY: 156 mg/dL — AB (ref 65–99)
GLUCOSE-CAPILLARY: 153 mg/dL — AB (ref 65–99)
Glucose-Capillary: 159 mg/dL — ABNORMAL HIGH (ref 65–99)
Glucose-Capillary: 143 mg/dL — ABNORMAL HIGH (ref 65–99)
Glucose-Capillary: 156 mg/dL — ABNORMAL HIGH (ref 65–99)

## 2015-10-13 LAB — PROTIME-INR
INR: 2.32
PROTHROMBIN TIME: 25.9 s — AB (ref 11.4–15.2)

## 2015-10-13 LAB — MAGNESIUM: MAGNESIUM: 2.2 mg/dL (ref 1.7–2.4)

## 2015-10-13 LAB — PHOSPHORUS: PHOSPHORUS: 3.4 mg/dL (ref 2.5–4.6)

## 2015-10-13 MED ORDER — POTASSIUM CHLORIDE 20 MEQ/15ML (10%) PO SOLN
20.0000 meq | Freq: Once | ORAL | Status: AC
  Administered 2015-10-13: 20 meq via ORAL
  Filled 2015-10-13: qty 15

## 2015-10-13 MED ORDER — MORPHINE SULFATE (PF) 2 MG/ML IV SOLN
2.0000 mg | INTRAVENOUS | Status: DC | PRN
  Administered 2015-10-13: 2 mg via INTRAVENOUS
  Filled 2015-10-13: qty 1

## 2015-10-13 MED ORDER — WARFARIN SODIUM 5 MG PO TABS
5.0000 mg | ORAL_TABLET | Freq: Once | ORAL | Status: AC
  Administered 2015-10-13: 5 mg via ORAL
  Filled 2015-10-13: qty 1

## 2015-10-13 MED ORDER — HYDRALAZINE HCL 20 MG/ML IJ SOLN
10.0000 mg | Freq: Four times a day (QID) | INTRAMUSCULAR | Status: DC | PRN
  Administered 2015-10-13 – 2015-10-14 (×2): 10 mg via INTRAVENOUS
  Filled 2015-10-13 (×2): qty 1

## 2015-10-13 MED ORDER — FREE WATER
250.0000 mL | Freq: Three times a day (TID) | Status: DC
  Administered 2015-10-13 – 2015-10-14 (×5): 250 mL

## 2015-10-13 NOTE — Progress Notes (Signed)
Pt. Transferred from [place]; SCDs applied, VS obtained, pt suctioned and family oriented to room and unit. Pt not interactive, call light within reach and family educated on how to use. Pt resting comfortably.  Jeb Levering, RN

## 2015-10-13 NOTE — Progress Notes (Addendum)
Pt transferred to 2C08. Report called and given to [facility]. Belongings transferred. Family notified of the transfer. [place] notified.

## 2015-10-13 NOTE — Progress Notes (Signed)
PULMONARY / CRITICAL CARE MEDICINE   Name: Cristina [name] MRN: [id number] DOB: 09/28/1940    ADMISSION DATE:  09/24/2015 CONSULTATION DATE:  09/27/15  REFERRING MD: Dr. Erlinda Hong  CHIEF COMPLAINT:  Fall   SUBJECTIVE:  No events overnight. Family meeting yesterday noted.  VITAL SIGNS: BP 105/69   Pulse 100   Temp 98.5 F (36.9 C) (Oral)   Resp 20   Ht 5\' 4"  (1.626 m)   Wt 78.3 kg (172 lb 9.9 oz)   SpO2 100%   BMI 29.63 kg/m   HEMODYNAMICS:    VENTILATOR SETTINGS: FiO2 (%):  [28 %-30 %] 30 %  INTAKE / OUTPUT: I/O last 3 completed shifts: In: [id number] [I.V.:254; NG/GT:1250] Out: 2485 [Urine:2485]  PHYSICAL EXAMINATION: General: chronically ill appearing female in NAD Neuro: Awake, alert, communicates, follows commands HEENT: #6 trach c/d/i, sutures in place, R IJ HD cath  Cardiac: irregular Chest: non-labored, clear  Abd: soft, non tender Ext: 1+ generalized edema, R arm dressing with odor Skin: no rashes  LABS:  BMET  Recent Labs Lab 10/11/15 0500 10/12/15 0219 10/13/15 0315  NA 141 142 147*  147*  K 3.5 3.6 3.5  3.4*  CL 100* 102 106  105  CO2 29 30 31  31   BUN 112* 105* 92*  92*  CREATININE 3.47* 3.09* 2.55*  2.56*  GLUCOSE 184* 158* 144*  145*   Electrolytes  Recent Labs Lab 10/11/15 0500 10/12/15 0219 10/13/15 0315  CALCIUM 9.1 9.1 9.6  9.6  MG 2.0 1.8 2.2  PHOS 4.2 3.5 3.4  3.4    CBC  Recent Labs Lab 10/11/15 0500 10/12/15 0219 10/13/15 0315  WBC 18.1* 15.5* 14.9*  HGB 7.6* 7.8* 8.0*  HCT 24.8* 25.9* 26.5*  PLT 260 291 328   Coag's  Recent Labs Lab 10/11/15 0500 10/12/15 0219 10/13/15 0315  INR 1.82 2.17 2.32    Sepsis Markers No results for input(s): LATICACIDVEN, PROCALCITON, O2SATVEN in the last 168 hours.  ABG  Recent Labs Lab 10/13/15 0756  PHART 7.489*  PCO2ART 44.7  PO2ART 72.0*     Liver Enzymes  Recent Labs Lab 10/08/15 0425  10/11/15 0500 10/12/15 0219 10/13/15 0315  AST 20  --   --   --   27  ALT 92*  --   --   --  46  ALKPHOS 109  --   --   --  117  BILITOT 0.8  --   --   --  0.8  ALBUMIN 2.8*  < > 2.6* 2.5* 2.6*  2.6*  < > = values in this interval not displayed.   Cardiac Enzymes No results for input(s): TROPONINI, PROBNP in the last 168 hours.  Glucose  Recent Labs Lab 10/12/15 1138 10/12/15 1604 10/12/15 1931 10/12/15 2324 10/13/15 0313 10/13/15 0720  GLUCAP 145* 148* 142* 159* 151* 156*   Imaging No results found.   STUDIES:  2D echo 9/10 >> EF [id number], mod MR, systolic function moderately reduced, PA peak pressure 21mm  CULTURES: Blood 9/10 > negative Sputum 9/10 > normal flora  MRSA 9/10 > negative  ANTIBIOTICS: Levaquin 9/10 > 9/16 Vancomycin 9/10 > 9/13  SIGNIFICANT EVENTS: 9/07 admit after fall. Had repair of right shoulder surgery. 9/10 PCCM consulted for hypotension, bradycardia. Intubated for airway.  Xarelto stopped. 9/12 Renal consulted, start CRRT 9/19 Family meeting >> limited resuscitation; off pressors 9/20 trach 9/21 stopped precedex 9/23 off vent overnight, weaned to 28% ATC   LINES/TUBES: L IJ CVL 9/10 >>  9/22 ETT 9/11 >> 9/20 Trach 6 cuffed (DF) 9/20 >> R IJ HD catheter 9/12 >>   I reviewed CXR myself, trach is good position.  DISCUSSION: 74 yo female had fall and developed Rt distal humerus fx.  Had repair on 9/07.  Developed bradycardia, hypotension, progressive renal failure >> likely from PNA, and medications (beta blocker/calcium channel blocker). Now s/p prolonged critical illness w/ weaning efforts c/b delirium, severe malnutrition & deconditioning. Discussed with palliative care team, ongoing discussion regarding plan of care.  PMHx of A fib on xarelto, HTN, HLD, Gout, Asthma, Anxiety.   ASSESSMENT / PLAN:  PULMONARY A: Acute hypoxic respiratory failure 2nd to HCAP, acute pulmonary edema. Hx of asthma. P:   Titrate O2 for sat of 88-92%. Plan for suture removal 9/29. SLP consult for PMV use per speech  pathology, family does not want any further escalation of care. F/u CXR intermittently. Continue pulmicort. Brovana, prn duoneb.  CARDIOVASCULAR A:  Shock 2nd to sepsis from PNA and bradycardia from beta blocker/calcium channel blocker-->resolved. Chronic A fib - on anticoagulation Elevated troponin 2nd to demand ischemia. Moderate mitral regurgitation. Hx of HTN, HLD. P:  Coumadin per pharmacy. LFT's improved>>Lipitor restarted 9/22. Continue metoprolol.  RENAL A:   AKI 2nd to ischemia >> baseline creatinine 0.77 from 08/19/15. P:   Family does not wish for any further dialysis at this point. Trend BMP  Replace electrolytes as indicated   GASTROINTESTINAL A:   Shock liver 2nd to ischemia - resolved. Nutrition. P:   Tube feeds. Protonix for SUP. NPO until able to swallow per speech recommendations.  HEMATOLOGIC A:   Anemia of critical illness. Mild thrombocytopenia. Coagulopathy - r/t shock liver >> resolved. P:  F/u CBC Coumadin per pharmacy   INFECTIOUS A:   Sepsis 2nd to HCAP >> completed Abx 9/16. P:   Monitor off Abx Trend wbc and fever curve   ENDOCRINE A:   Relative adrenal insufficiency - cortisol 14.3 from 09/28/15 >> off solu cortef. Hyperglycemia. P:   SSI while on tube feeds  NEUROLOGIC A:   Acute metabolic encephalopathy. Hx of anxiety. Hx of RLS. Deconditioning. P:   RASS goal: 0 PRN fentanyl Continue celexa, mirapex Continue 1/2 dosing of her home xanax PT/OT  ORTHOPEDICS A: Rt humerus fracture. P: Per ortho  GOALS OF CARE A: Limited resuscitation. P: Discussed with bedside RN and PCCM-NP, no further escalation of care, family wants 2-3 days before making decision to withdraw or not.  Rush Farmer, M.D. [facility] Pulmonary/Critical Care Medicine. Pager: [phone]. After hours pager: [phone].

## 2015-10-13 NOTE — Progress Notes (Signed)
Daily Progress Note   Patient Name: Cristina [name]       Date: 10/13/2015 DOB: 10-30-1940  Age: 75 y.o. MRN#: [id number] Attending Physician: Thurnell Lose, MD Primary Care Physician: Tawanna Solo, MD Admit Date: 09/24/2015  Reason for Consultation/Follow-up: Establishing goals of care  75 yo female PMHx of A fib on xarelto, HTN, HLD, Gout, Asthma, Anxiety. had fall and developed Rt distal humerus fx.  Had repair on 9/07.  Developed bradycardia, hypotension, progressive renal failure >> likely from PNA, and medications (beta blocker/calcium channel blocker). Now s/p prolonged critical illness and delirium, severe malnutrition & deconditioning. She had a trach placed 9/20 now tolerating pressure support with trach collar.   Overall appears frail, critical care involved palliative care, currently partial code, she is still somnolent unable to once a questions appropriately, palliative care consulted for goals of care.    Subjective:  Patient seen this am in conjunction with Dr. Candiss Norse. Her children seem to be split on what the patient would want moving forward with her care. She has been noted intermittently by staff to being saying things such as, "I don't want this."  Dr. Candiss Norse and I attempted to discuss wishes with patient again, but she was not able to participate in conversation.  I called and spoke with each of her daughter independently.  I also called and left a voicemail for her son. Langley Gauss and Hilliard both report that I feel that her mother would want to focus only on her comfort moving forward and not pursue any further aggressive treatments or therapies.  Lauro Regulus reports that she had been prepared for her mother to continue to worsen and die, but she now wants to see how she does over  the next several days and feels that she is going to recover.  I did not speak with Shanon Brow, but he is reported to want to continue treatment and was supposed to any consideration of focus on comfort.   Length of Stay: 17  Current Medications: Scheduled Meds:  . arformoterol  15 mcg Nebulization BID  . aspirin  81 mg Oral Daily  . atorvastatin  20 mg Oral q1800  . budesonide (PULMICORT) nebulizer solution  0.5 mg Nebulization BID  . chlorhexidine  15 mL Mouth Rinse BID  . citalopram  20 mg Per Tube QHS  .  darbepoetin (ARANESP) injection - NON-DIALYSIS  100 mcg Subcutaneous Q Thu-1800  . free water  250 mL Per Tube Q8H  . insulin aspart  0-20 Units Subcutaneous Q4H  . mouth rinse  15 mL Mouth Rinse 10 times per day  . metoprolol tartrate  75 mg Oral BID  . pantoprazole sodium  40 mg Per Tube Daily  . pramipexole  2 mg Oral QHS  . Vitamin D (Ergocalciferol)  50,000 Units Oral Q7 days  . Warfarin - Pharmacist Dosing Inpatient   Does not apply q1800    Continuous Infusions: . feeding supplement (VITAL 1.5 CAL) 1,000 mL (10/13/15 1537)    PRN Meds: sodium chloride, acetaminophen (TYLENOL) oral liquid 160 mg/5 mL, ALPRAZolam, fentaNYL (SUBLIMAZE) injection, hydrALAZINE, ipratropium-albuterol, morphine injection, [DISCONTINUED] ondansetron **OR** ondansetron (ZOFRAN) IV, polyethylene glycol, sorbitol  Physical Exam         General: chronically ill appearing female in NAD Neuro: Intermittent periods of wakefulness does not follow commands HEENT:trach c/d/i, sutures in place, R IJ HD cath  Cardiac: irregular Chest: non-labored, clear  Abd: soft, non tender Ext: 1+ generalized edema Skin: no rashes  Vital Signs: BP (!) 175/83   Pulse 97   Temp 99.2 F (37.3 C) (Oral)   Resp 18   Ht 5\' 4"  (1.626 m)   Wt 78.3 kg (172 lb 9.9 oz)   SpO2 98%   BMI 29.63 kg/m  SpO2: SpO2: 98 % O2 Device: O2 Device: Tracheostomy Collar O2 Flow Rate: O2 Flow Rate (L/min): 8 L/min  Intake/output  summary:   Intake/Output Summary (Last 24 hours) at 10/13/15 1716 Last data filed at 10/13/15 1600  Gross per 24 hour  Intake             1010 ml  Output             2005 ml  Net             -995 ml   LBM: Last BM Date: 10/12/15 Baseline Weight: Weight: 81.2 kg (179 lb) Most recent weight: Weight: 78.3 kg (172 lb 9.9 oz)       Palliative Assessment/Data:    Flowsheet Rows   Flowsheet Row Most Recent Value  Intake Tab  Referral Department  Critical care  Unit at Time of Referral  ICU  Palliative Care Primary Diagnosis  Other (Comment)  Palliative Care Type  New Palliative care  Reason for referral  Clarify Goals of Care  Date first seen by Palliative Care  10/11/15  Clinical Assessment  Palliative Performance Scale Score  20%  Pain Max last 24 hours  4  Pain Min Last 24 hours  3  Dyspnea Max Last 24 Hours  4  Dyspnea Min Last 24 hours  3  Nausea Max Last 24 Hours  4  Nausea Min Last 24 Hours  3  Anxiety Max Last 24 Hours  4  Anxiety Min Last 24 Hours  3  Psychosocial & Spiritual Assessment  Palliative Care Outcomes  Patient/Family meeting held?  Yes  Who was at the meeting?  patient, 2 daughters and son in law   Palliative Care Outcomes  Clarified goals of care  Palliative Care follow-up planned  Yes, Facility      Patient Active Problem List   Diagnosis Date Noted  . Status post tracheostomy ([place])   . Surgery, elective   . History of ETT   . Acute hypoxemic respiratory failure ([place])   . Pressure ulcer 10/03/2015  . Persistent atrial fibrillation (  [place])   . Encounter for central line placement   . Elevated troponin   . Orthostatic hypotension   . Hypotension due to drugs 09/27/2015  . Bradycardia   . Acute respiratory failure ([place])   . S/P ORIF (open reduction internal fixation) fracture 08/19/2015  . Anaphylactic reaction 01/23/2014  . Essential hypertension 08/07/2013  . Atrial fibrillation ([place]) 07/28/2013  . Hypertensive heart disease 07/28/2013  .  Chronic diastolic congestive heart failure ([place]) 07/28/2013  . Obesity (BMI 30-39.9)   . Gout   . Current use of long term anticoagulation   . Acute diastolic CHF (congestive heart failure) ([place]) 07/24/2013    Palliative Care Assessment & Plan   Patient Profile:    Assessment:  recent R humerus Fx s/p repair Recent VDRF s/p trach.  Recent renal failure, now off CRRT Deconditioning, generalized   Recommendations/Plan: PLAN: continue with current mode of care, to go to step down, to attempt trach down size, passy muir valve trial, speech pathology eval, continue tube feeds for now. Reportedly, all 4 kids don't want patient to undergo PEG tube, no dialysis, but I did not discuss this myself today. - There is disagreement amongst family about exactly what Mr. Lavell Luster wishes would be moving forward for her care.  She is a source of support to members of her family both emotionally and financially, and concern has been raised that this may be affecting decisions of family rather than focusing what the patient would choose for her care moving forward if she were to understand her condition.   - Plan for ethics consult. I have scheduled an appointment to discuss with ethics tomorrow at 8:30 AM.  Family aware of plan for ethics consult to hopefully help delineate patient wishes.  Palliative will continue to follow along and help guide decision making.   Code Status:    Code Status Orders        Start     Ordered   10/06/15 1500  Limited resuscitation (code)  Continuous    Question Answer Comment  In the event of cardiac or respiratory ARREST: Perform CPR No   In the event of cardiac or respiratory ARREST: Perform Intubation/Mechanical Ventilation Yes   In the event of cardiac or respiratory ARREST: Perform Defibrillation or Cardioversion if indicated No   Antiarrhythmic drugs - Any drug used to treat arrhythmias No   Vasoactive drug - Any drug used to stabilize blood pressure Yes        10/06/15 1459    Code Status History    Date Active Date Inactive Code Status Order ID Comments User Context   10/06/2015  2:51 PM 10/06/2015  2:59 PM DNR [id number]  Erick Colace, NP Inpatient   09/27/2015  3:08 PM 10/06/2015  2:51 PM Full Code [id number]  Rush Landmark, MD Inpatient   09/24/2015  6:51 PM 09/27/2015  3:08 PM Full Code [id number]  Leandrew Koyanagi, MD Inpatient   08/19/2015  3:20 PM 08/21/2015  6:19 PM Full Code [id number]  Leandrew Koyanagi, MD Inpatient   01/24/2014 12:19 AM 01/24/2014  6:16 PM Full Code [id number]  Rhetta Mura Schorr, NP Inpatient   01/23/2014  7:50 PM 01/24/2014 12:19 AM DNR [id number]  Nita Sells, MD Inpatient   07/22/2013 11:50 PM 07/28/2013  1:52 PM Full Code [id number]  Lonn Georgia, PA-C Inpatient       Prognosis:   guarded  Discharge Planning:  To Be Determined  Care plan was discussed with  Patient, son, 3 daughters, RN, Dr Nelda Marseille.   Thank you for allowing the Palliative Medicine Team to assist in the care of this patient.   Time In: 0900 Time Out: 0940 Total Time 40 Prolonged Time Billed No      Greater than 50%  of this time was spent counseling and coordinating care related to the above assessment and plan.  Micheline Rough, MD [phone]  Please contact Palliative Medicine Team phone at [phone] for questions and concerns.

## 2015-10-13 NOTE — Progress Notes (Signed)
SLP Cancellation Note  Patient Details Name: Cristina [name] MRN: [id number] DOB: 07/10/1940   Cancelled treatment:       Reason Eval/Treat Not Completed: Fatigue/lethargy limiting ability to participate;Patient's level of consciousness. Pt recently given pain meds, not alert for PMSV trials. Of note, pt may need a downsize or cuffless trach before she can be more successful with valve, based on initial assessment notes. Discussed with RT.    Blessings Inglett, Katherene Ponto 10/13/2015, 3:24 PM

## 2015-10-13 NOTE — Progress Notes (Signed)
Subjective: Interval History: bp stable, PC has assisted, staying off vent.  Objective: Vital signs in last 24 hours: Temp:  [97.8 F (36.6 C)-98.5 F (36.9 C)] 98.5 F (36.9 C) (09/26 0400) Pulse Rate:  [72-100] 97 (09/26 0700) Resp:  [14-22] 19 (09/26 0700) BP: (119-187)/(53-94) 119/70 (09/26 0600) SpO2:  [93 %-100 %] 100 % (09/26 0746) FiO2 (%):  [28 %-30 %] 30 % (09/26 0746) Weight:  [78.3 kg (172 lb 9.9 oz)] 78.3 kg (172 lb 9.9 oz) (09/26 0600) Weight change: -1.4 kg (-3 lb 1.4 oz)  Intake/Output from previous day: 09/25 0701 - 09/26 0700 In: 730 [I.V.:80; NG/GT:650] Out: 1975 [Urine:1975] Intake/Output this shift: No intake/output data recorded.  General appearance: morbidly obese, pale and nodds head, obeys some commands Neck: Trach, RIJ HD cath Resp: diminished breath sounds bilaterally and rales bibasilar Cardio: S1, S2 normal and systolic murmur: holosystolic 2/6, blowing at apex GI: obese, pos bs soft Extremities: wrapped R arm  Card irreg  Lab Results:  Recent Labs  10/12/15 0219 10/13/15 0315  WBC 15.5* 14.9*  HGB 7.8* 8.0*  HCT 25.9* 26.5*  PLT 291 328   BMET:  Recent Labs  10/12/15 0219 10/13/15 0315  NA 142 147*  147*  K 3.6 3.5  3.4*  CL 102 106  105  CO2 30 31  31   GLUCOSE 158* 144*  145*  BUN 105* 92*  92*  CREATININE 3.09* 2.55*  2.56*  CALCIUM 9.1 9.6  9.6   No results for input(s): PTH in the last 72 hours. Iron Studies: No results for input(s): IRON, TIBC, TRANSFERRIN, FERRITIN in the last 72 hours.  Studies/Results: No results found.  I have reviewed the patient's current medications.  Assessment/Plan: 1 AKI improving.  Acid/base/K ok.  S Na rising and may need free water soon. 2 Nutrition on TF 3 Resp failure on Trach , per Pulm 4 Afib on anticoag, rate control 5 Arm fx 6 DM 7 Obesity 8 Depression P follow lytes, use free water, pull HD cath    LOS: 17 days   Kathrynn Backstrom L 10/13/2015,7:48 AM

## 2015-10-13 NOTE — Progress Notes (Signed)
.   ANTICOAGULATION CONSULT NOTE - Follow-up Consult  Pharmacy Consult for warfarin Indication: atrial fibrillation  Patient Measurements: Height: 5\' 4"  (162.6 cm) Weight: 172 lb 9.9 oz (78.3 kg) IBW/kg (Calculated) : 54.7 Heparin Dosing Weight: 76  Vital Signs: Temp: 98.5 F (36.9 C) (09/26 0400) Temp Source: Oral (09/26 0400) BP: 119/70 (09/26 0600) Pulse Rate: 97 (09/26 0700)  Labs:  Recent Labs  10/11/15 0500 10/12/15 0219 10/13/15 0315  HGB 7.6* 7.8* 8.0*  HCT 24.8* 25.9* 26.5*  PLT 260 291 328  LABPROT 21.3* 24.5* 25.9*  INR 1.82 2.17 2.32  HEPARINUNFRC 0.59 0.49  --   CREATININE 3.47* 3.09* 2.55*  2.56*    Estimated Creatinine Clearance: 18.9 mL/min (by C-G formula based on SCr of 2.56 mg/dL (H)).   Assessment: 48 yoF with h/o afib on Xarelto PTA (last dose 9/10). Transitioned to IV heparin in setting of acute shock liver and AKI. LFT now wnl and UOP improving without CRRT. Baseline INR 1.19 on 9/19. Pt transitioned to Coumadin 9/21 given questionable recovery of renal function. INR therapeutic 9/25 and heparin discontinued, remains therapeutic today at 2.32. Hgb 8.0, plt 328, no S/Sx bleeding noted  Goal of Therapy:  INR 2-3 Heparin level 0.3-0.7   Plan:  - Coumadin 5 mg PO x 1 tonight  - Daily INR, CBC - Monitor closely for s/sx bleeding   Arrie Senate, PharmD PGY-1 Pharmacy Resident Pager: [phone] 10/13/2015

## 2015-10-13 NOTE — Progress Notes (Addendum)
PROGRESS NOTE                                                                                                                                                                                                             Patient Demographics:    Cristina [name], is a 75 y.o. female, DOB - 06/16/1940, [id number]  Admit date - 09/24/2015   Admitting Physician Naiping Ephriam Jenkins, MD  Outpatient Primary MD for the patient is Tawanna Solo, MD  LOS - 17  No chief complaint on file.      Brief Narrative   75 yo female PMHx of A fib on xarelto, HTN, HLD, Gout, Asthma, Anxiety. had fall and developed Rt distal humerus fx. Had repair on 9/07. Developed bradycardia, hypotension, progressive renal failure >>likely from PNA, and medications (beta blocker/calcium channel blocker). Now s/p prolonged critical illness and delirium, severe malnutrition & deconditioning. She had a trach placed 9/20 now tolerating pressure support with trach collar.  Overall appears frail, critical care involved palliative care, currently partial code, she is still somnolent unable to once a questions appropriately, she is due for a palliative care meeting for goals of care on 10/12/2015.    Subjective:    Cristina [name] today In bed, somnolent, unable to once a questions reliably.   Assessment  & Plan :     1. Acute hypoxic respiratory failure 2nd to HCAP, acute pulmonary edema with history of asthma. She was on prolonged ventilator, eventually underwent tracheostomy placement on 10/07/2015, currently weaned down to trach collar since 10/10/2015, she has finished her antibiotic treatment, has been diuresed in the past, currently on supportive care with trach collar and nebulizer treatments along with trach site care per protocol.  However not much meaningful recovery, still is encephalopathic and severely deconditioned, palliative care has been  involved after discussions with critical care, family at this time is divided on future course of action, palliative care has involved Audiological scientist. Kindly see details below in family communication.   2. Shock due to combination of sepsis and beta blocker/calcium channel blocker induced bradycardia. Resolved, had mild troponin elevation under the care of critical care due to demand ischemia not any serious pattern, seen by cardiology, echocardiogram noted with EF of 55% and chronic diastolic CHF, currently on aspirin, statin and moderate dose beta blocker and stable. Shock  has resolved.  3. Chronic atrial fibrillation [place] vasc 2 score of at least 4 - on beta blocker, was on heparin and Coumadin overlap, now on Coumadin as INR is therapeutic being monitored by pharmacy, per cardiology stress test once medically stable. In by cardiology this admission.  4. ARF. ATN secondary to shock and ischemia. Underwent dialysis and CRRT this admission, renal following, currently CRRT has been held. We'll defer management of this problem to nephrology who are on the case.  5. Always right humerus fracture. Status post surgical correction by Dr. Erlinda Hong, right arm is under Bandage defer management to orthopedics.  6. Anemia of chronic disease along with anemia of critical care illness. Monitor, no indication for transfusion.  7. Encephalopathy. Critical care delirium, metabolic encephalopathy, also generalized weakness and deconditioning, appears frail, agree with palliative care involvement for long-term goals of care. Discussed with critical care physician Dr. Lake Bells on 10/12/2015.  8. Relative adrenal insufficiency earlier in admission while she was in shock. Has finished steroid support.  9. Shock liver due to hypotension. After supportive care for hypertension liver function has normalized.  10. Mild hypokalemia. Replaced.    Family Communication  :  Me & palliative care physician Dr. Lorin Picket had detailed  discussions with patient's daughter bedside on 10/13/2015 10:30 AM, this daughter and her sister in [place] want comfort measures and hospice, however 2 other family members want full care but per her daughter today they have some financial interest with the patient. At this time Ethics has been involved by palliative care as well.   Code Status :  partial  Diet : VIA NG  Disposition Plan  :  Step down  Consults  :  PCCM, Pall care, Cards, Renal, ethics committee  Procedures  :   SIGNIFICANT EVENTS: 9/07 admit after fall. Had repair of right shoulder surgery. 9/10 PCCM consulted for hypotension, bradycardia. Intubated for airway.  Xarelto stopped. 9/12 Renal consulted, start CRRT 9/19 Family meeting >> limited resuscitation; off pressors 9/20 trach 9/21 stopped precedex 9/23 off vent overnight, weaned to 28% ATC   LINES/TUBES: L IJ CVL 9/10 >> 9/22 ETT 9/11 >> 9/20 Trach 6 cuffed (DF) 9/20 >> R IJ HD catheter 9/12 >>  TTE 09-27-15 - EF 55%, dCHF   DVT Prophylaxis  :   Heparin gtt  Lab Results  Component Value Date   PLT 328 10/13/2015    Inpatient Medications  Scheduled Meds: . arformoterol  15 mcg Nebulization BID  . aspirin  81 mg Oral Daily  . atorvastatin  20 mg Oral q1800  . budesonide (PULMICORT) nebulizer solution  0.5 mg Nebulization BID  . chlorhexidine  15 mL Mouth Rinse BID  . citalopram  20 mg Per Tube QHS  . darbepoetin (ARANESP) injection - NON-DIALYSIS  100 mcg Subcutaneous Q Thu-1800  . free water  250 mL Per Tube Q8H  . insulin aspart  0-20 Units Subcutaneous Q4H  . mouth rinse  15 mL Mouth Rinse 10 times per day  . metoprolol tartrate  75 mg Oral BID  . pantoprazole sodium  40 mg Per Tube Daily  . potassium chloride  20 mEq Oral Once  . pramipexole  2 mg Oral QHS  . Vitamin D (Ergocalciferol)  50,000 Units Oral Q7 days  . warfarin  5 mg Oral ONCE-1800  . Warfarin - Pharmacist Dosing Inpatient   Does not apply q1800   Continuous Infusions: .  feeding supplement (VITAL 1.5 CAL) 1,000 mL (10/12/15 2000)   PRN  Meds:.sodium chloride, acetaminophen (TYLENOL) oral liquid 160 mg/5 mL, ALPRAZolam, fentaNYL (SUBLIMAZE) injection, ipratropium-albuterol, [DISCONTINUED] ondansetron **OR** ondansetron (ZOFRAN) IV, polyethylene glycol, sorbitol  Antibiotics  :    Anti-infectives    Start     Dose/Rate Route Frequency Ordered Stop   10/01/15 0000  vancomycin (VANCOCIN) IVPB 1000 mg/200 mL premix  Status:  Discontinued     1,000 mg 200 mL/hr over 60 Minutes Intravenous Every 24 hours 09/30/15 1143 10/01/15 0626   09/30/15 2100  vancomycin (VANCOCIN) IVPB 1000 mg/200 mL premix  Status:  Discontinued     1,000 mg 200 mL/hr over 60 Minutes Intravenous Every 24 hours 09/29/15 1955 09/30/15 1143   09/30/15 1800  Levofloxacin (LEVAQUIN) IVPB 250 mg     250 mg 50 mL/hr over 60 Minutes Intravenous Every 24 hours 09/29/15 1942 10/03/15 1858   09/29/15 1758  levofloxacin (LEVAQUIN) IVPB 750 mg  Status:  Discontinued     750 mg 100 mL/hr over 90 Minutes Intravenous Every 48 hours 09/28/15 1030 09/29/15 0754   09/29/15 1758  levofloxacin (LEVAQUIN) IVPB 500 mg  Status:  Discontinued     500 mg 100 mL/hr over 60 Minutes Intravenous Every 48 hours 09/29/15 0754 09/29/15 1943   09/29/15 0656  vancomycin (VANCOCIN) IVPB 1000 mg/200 mL premix  Status:  Discontinued     1,000 mg 200 mL/hr over 60 Minutes Intravenous Every 24 hours 09/28/15 1039 09/29/15 0751   09/28/15 0600  vancomycin (VANCOCIN) IVPB 750 mg/150 ml premix  Status:  Discontinued     750 mg 150 mL/hr over 60 Minutes Intravenous Every 12 hours 09/27/15 1542 09/28/15 1039   09/27/15 1600  levofloxacin (LEVAQUIN) IVPB 750 mg  Status:  Discontinued     750 mg 100 mL/hr over 90 Minutes Intravenous Every 24 hours 09/27/15 1540 09/28/15 1030   09/27/15 1545  vancomycin (VANCOCIN) 1,500 mg in sodium chloride 0.9 % 500 mL IVPB     1,500 mg 250 mL/hr over 120 Minutes Intravenous  Once 09/27/15 1541  09/27/15 1958   09/25/15 0000  vancomycin (VANCOCIN) IVPB 1000 mg/200 mL premix     1,000 mg 200 mL/hr over 60 Minutes Intravenous Every 12 hours 09/24/15 1851 09/25/15 1459   09/24/15 1015  vancomycin (VANCOCIN) IVPB 1000 mg/200 mL premix     1,000 mg 200 mL/hr over 60 Minutes Intravenous  Once 09/24/15 1004 09/24/15 1155   09/24/15 1006  vancomycin (VANCOCIN) 1-5 GM/200ML-% IVPB    Comments:  Leandrew Koyanagi   : cabinet override      09/24/15 1006 09/24/15 2214         Objective:   Vitals:   10/13/15 0700 10/13/15 0746 10/13/15 0747 10/13/15 0800  BP:   117/70 105/69  Pulse: 97  98 100  Resp: 19  17 20   Temp:    98 F (36.7 C)  TempSrc:    Oral  SpO2: 100% 100% 100% 100%  Weight:      Height:        Wt Readings from Last 3 Encounters:  10/13/15 78.3 kg (172 lb 9.9 oz)  09/15/15 81.2 kg (179 lb)  08/19/15 77.1 kg (170 lb)     Intake/Output Summary (Last 24 hours) at 10/13/15 0909 Last data filed at 10/13/15 0800  Gross per 24 hour  Intake              980 ml  Output             2150 ml  Net            -  1170 ml     Physical Exam  Patient in bed, somnolent, unable to reliably follow commands or answer questions [place].AT,PERRAL Supple Neck,No JVD, No cervical lymphadenopathy appriciated. , Trach site stable, NG tube in place, Symmetrical Chest wall movement, Good air movement bilaterally, few rales RRR,No Gallops,Rubs or new Murmurs, No Parasternal Heave +ve B.Sounds, Abd Soft, No tenderness, No organomegaly appriciated, No rebound - guarding or rigidity. No Cyanosis, Clubbing or edema, No new Rash or bruise, Right arm and a bandage,    Data Review:    CBC  Recent Labs Lab 10/09/15 0452 10/10/15 0500 10/11/15 0500 10/12/15 0219 10/13/15 0315  WBC 12.7* 14.7* 18.1* 15.5* 14.9*  HGB 7.3* 7.4* 7.6* 7.8* 8.0*  HCT 23.8* 24.0* 24.8* 25.9* 26.5*  PLT 168 202 260 291 328  MCV 88.1 89.6 90.8 92.2 92.7  MCH 27.0 27.6 27.8 27.8 28.0  MCHC 30.7 30.8 30.6  30.1 30.2  RDW 17.1* 17.6* 17.8* 18.1* 19.5*    Chemistries   Recent Labs Lab 10/08/15 0425 10/09/15 0452 10/09/15 1551 10/10/15 0500 10/11/15 0500 10/12/15 0219 10/13/15 0315  NA 138 136 139 139 141 142 147*  147*  K 4.4 3.4* 3.9 3.6 3.5 3.6 3.5  3.4*  CL 105 102 104 103 100* 102 106  105  CO2 20* 25 27 27 29 30 31  31   GLUCOSE 123* 123* 106* 108* 184* 158* 144*  145*  BUN 48* 81* 96* 106* 112* 105* 92*  92*  CREATININE 1.62* 2.79* 3.17* 3.27* 3.47* 3.09* 2.55*  2.56*  CALCIUM 9.2 9.2 9.2 9.3 9.1 9.1 9.6  9.6  MG 2.4 2.2  --  2.0 2.0 1.8 2.2  AST 20  --   --   --   --   --  27  ALT 92*  --   --   --   --   --  46  ALKPHOS 109  --   --   --   --   --  117  BILITOT 0.8  --   --   --   --   --  0.8   ------------------------------------------------------------------------------------------------------------------ No results for input(s): CHOL, HDL, LDLCALC, TRIG, CHOLHDL, LDLDIRECT in the last 72 hours.  Lab Results  Component Value Date   HGBA1C 6.0 (H) 07/23/2013   ------------------------------------------------------------------------------------------------------------------ No results for input(s): TSH, T4TOTAL, T3FREE, THYROIDAB in the last 72 hours.  Invalid input(s): FREET3 ------------------------------------------------------------------------------------------------------------------ No results for input(s): VITAMINB12, FOLATE, FERRITIN, TIBC, IRON, RETICCTPCT in the last 72 hours.  Coagulation profile  Recent Labs Lab 10/09/15 0452 10/10/15 0500 10/11/15 0500 10/12/15 0219 10/13/15 0315  INR 1.40 1.48 1.82 2.17 2.32    No results for input(s): DDIMER in the last 72 hours.  Cardiac Enzymes No results for input(s): CKMB, TROPONINI, MYOGLOBIN in the last 168 hours.  Invalid input(s): CK ------------------------------------------------------------------------------------------------------------------    Component Value Date/Time   BNP  208.1 (H) 01/23/2014 1220   Lab Results  Component Value Date   INR 2.32 10/13/2015   INR 2.17 10/12/2015   INR 1.82 10/11/2015    Micro Results No results found for this or any previous visit (from the past 240 hour(s)).  Radiology Reports  Dg Elbow 2 Views Right  Result Date: 09/24/2015 CLINICAL DATA:  ORIF. EXAM: RIGHT ELBOW - 2 VIEW; DG C-ARM GT 120 MIN COMPARISON:  CT 09/15/2015. FINDINGS: ORIF distal humeral fracture. Hardware intact. Anatomic alignment. ORIF proximal ulna . Hardware intact. Anatomic alignment. Two images obtained. 0 minutes 23 seconds fluoroscopy  time. IMPRESSION: Postsurgical changes about the right elbow . Electronically Signed   By: Marcello Moores  Register   On: 09/24/2015 16:19     Ct 3d Recon At Scanner  Result Date: 09/16/2015 CLINICAL DATA:  Patient fell wall today at home. Right elbow fractures. EXAM: CT OF THE RIGHT ELBOW WITHOUT CONTRAST; 3-DIMENSIONAL CT IMAGE RENDERING ON ACQUISITION WORKSTATION TECHNIQUE: Multidetector CT imaging was performed according to the standard protocol. Multiplanar CT image reconstructions were also generated. COMPARISON:  Right elbow radiographs 09/15/2015 FINDINGS: Comminuted fractures demonstrated of the distal right humerus. Oblique fracture line begins in the distal shaft and extends towards the intercondylar region. There is mild posterior displacement and overlap of the distal fracture fragments. Transverse fracture lines extend to the medial and lateral epicondyles. Condylar fragments are displaced and distracted. Impaction of the condylar fragments into the distal humerus. The ulnar and radial joints appear preserved without evidence of dislocation at the joints. Fracture lines do not appear to extend to the articular surfaces. Soft tissue swelling and effusion are present. Incidental note of a fracture of the lateral right ribs which could be acute. IMPRESSION: Comminuted fractures of the distal right humerus involving the distal  shaft and intercondylar regions. Impaction and distraction of the condylar fragments. Posterior angulation and displacement of the distal shaft fragment. Fracture lines do not appear to be intraarticular and there is no evidence of dislocation of the joint. Incidental note of a right lateral rib fracture which could be acute. Electronically Signed   By: Lucienne Capers M.D.   On: 09/16/2015 01:02   Dg Chest Port 1 View  Result Date: 10/11/2015 CLINICAL DATA:  Acute resp failure EXAM: PORTABLE CHEST 1 VIEW COMPARISON:  One day prior FINDINGS: Right internal jugular line is unchanged. Patient rotated left. Nasogastric tube extends beyond the inferior aspect of the film. Tracheostomy appropriately positioned. Numerous leads and wires project over the chest. Cardiomegaly accentuated by AP portable technique. Atherosclerosis in the transverse aorta. Probable left pleural effusion. No pneumothorax. Mild interstitial edema. Persistent left greater than right base airspace disease. IMPRESSION: No significant change since one day prior. Left worse than right base airspace disease with probable left pleural effusion. Cardiomegaly with mild interstitial edema. Electronically Signed   By: Abigail Miyamoto M.D.   On: 10/11/2015 07:19     Dg Humerus Right  Result Date: 09/15/2015 CLINICAL DATA:  Pain after trauma EXAM: RIGHT HUMERUS - 2+ VIEW COMPARISON:  None. FINDINGS: The distal humeral fracture is again evident, extending into the intercondylar region. This is seen to better advantage on the accompanying elbow radiographs. The remainder of the humerus is intact. IMPRESSION: Distal humeral fracture with mild apex -anterior angulation. Remainder of the humerus is intact. Electronically Signed   By: Andreas Newport M.D.   On: 09/15/2015 21:44   Dg C-arm Gt 120 Min  Result Date: 09/24/2015 CLINICAL DATA:  ORIF. EXAM: RIGHT ELBOW - 2 VIEW; DG C-ARM GT 120 MIN COMPARISON:  CT 09/15/2015. FINDINGS: ORIF distal humeral  fracture. Hardware intact. Anatomic alignment. ORIF proximal ulna . Hardware intact. Anatomic alignment. Two images obtained. 0 minutes 23 seconds fluoroscopy time. IMPRESSION: Postsurgical changes about the right elbow . Electronically Signed   By: Marcello Moores  Register   On: 09/24/2015 16:19    Time Spent in minutes  30   Lala Lund K M.D on 10/13/2015 at 9:09 AM  Between 7am to 7pm - Pager - [phone]  After 7pm go to [email or web address] - password [email or web address]  [facility] -  Office  [phone]      

## 2015-10-14 LAB — GLUCOSE, CAPILLARY
GLUCOSE-CAPILLARY: 174 mg/dL — AB (ref 65–99)
Glucose-Capillary: 161 mg/dL — ABNORMAL HIGH (ref 65–99)
Glucose-Capillary: 152 mg/dL — ABNORMAL HIGH (ref 65–99)
Glucose-Capillary: 146 mg/dL — ABNORMAL HIGH (ref 65–99)
Glucose-Capillary: 161 mg/dL — ABNORMAL HIGH (ref 65–99)

## 2015-10-14 LAB — RENAL FUNCTION PANEL
ALBUMIN: 2.6 g/dL — AB (ref 3.5–5.0)
ANION GAP: 14 (ref 5–15)
BUN: 78 mg/dL — ABNORMAL HIGH (ref 6–20)
CALCIUM: 9.7 mg/dL (ref 8.9–10.3)
CO2: 33 mmol/L — ABNORMAL HIGH (ref 22–32)
Chloride: 104 mmol/L (ref 101–111)
Creatinine, Ser: 2.23 mg/dL — ABNORMAL HIGH (ref 0.44–1.00)
GFR calc non Af Amer: 20 mL/min — ABNORMAL LOW (ref >60)
GFR, EST AFRICAN AMERICAN: 23 mL/min — AB (ref >60)
Glucose, Bld: 184 mg/dL — ABNORMAL HIGH (ref 65–99)
PHOSPHORUS: 3.3 mg/dL (ref 2.5–4.6)
Potassium: 3.5 mmol/L (ref 3.5–5.1)
SODIUM: 151 mmol/L — AB (ref 135–145)

## 2015-10-14 LAB — PROTIME-INR
INR: 2.58
PROTHROMBIN TIME: 28.2 s — AB (ref 11.4–15.2)

## 2015-10-14 MED ORDER — PROCHLORPERAZINE MALEATE 10 MG PO TABS
10.0000 mg | ORAL_TABLET | Freq: Four times a day (QID) | ORAL | Status: DC | PRN
  Filled 2015-10-14: qty 1

## 2015-10-14 MED ORDER — MORPHINE SULFATE (CONCENTRATE) 10 MG/0.5ML PO SOLN: 5.0000 mg | ORAL | Status: DC | PRN

## 2015-10-14 MED ORDER — ACETAMINOPHEN 325 MG PO TABS
650.0000 mg | ORAL_TABLET | Freq: Four times a day (QID) | ORAL | Status: DC | PRN
  Administered 2015-10-14: 650 mg via ORAL
  Filled 2015-10-14: qty 2

## 2015-10-14 MED ORDER — HALOPERIDOL 0.5 MG PO TABS
0.5000 mg | ORAL_TABLET | ORAL | Status: DC | PRN
  Filled 2015-10-14: qty 1

## 2015-10-14 MED ORDER — FENTANYL CITRATE (PF) 100 MCG/2ML IJ SOLN
25.0000 ug | INTRAMUSCULAR | Status: DC | PRN
  Administered 2015-10-14 – 2015-10-15 (×3): 25 ug via INTRAVENOUS
  Filled 2015-10-14 (×3): qty 2

## 2015-10-14 MED ORDER — LORAZEPAM 2 MG/ML IJ SOLN: 1.0000 mg | INTRAMUSCULAR | Status: DC | PRN

## 2015-10-14 MED ORDER — PROCHLORPERAZINE 25 MG RE SUPP
25.0000 mg | Freq: Two times a day (BID) | RECTAL | Status: DC | PRN
Start: 2015-10-14 — End: 2015-10-15
  Filled 2015-10-14: qty 1

## 2015-10-14 MED ORDER — LORAZEPAM 2 MG/ML PO CONC
1.0000 mg | ORAL | Status: DC | PRN
  Filled 2015-10-14: qty 0.5

## 2015-10-14 MED ORDER — WARFARIN SODIUM 5 MG PO TABS: 5.0000 mg | ORAL_TABLET | Freq: Once | ORAL | Status: DC

## 2015-10-14 MED ORDER — PROCHLORPERAZINE EDISYLATE 5 MG/ML IJ SOLN
10.0000 mg | Freq: Two times a day (BID) | INTRAMUSCULAR | Status: DC | PRN
  Filled 2015-10-14: qty 2

## 2015-10-14 MED ORDER — LORAZEPAM 1 MG PO TABS: 1.0000 mg | ORAL_TABLET | ORAL | Status: DC | PRN

## 2015-10-14 MED ORDER — WARFARIN VIDEO: Freq: Once | Status: DC

## 2015-10-14 MED ORDER — HALOPERIDOL LACTATE 2 MG/ML PO CONC
0.5000 mg | ORAL | Status: DC | PRN
  Filled 2015-10-14: qty 0.3

## 2015-10-14 MED ORDER — ACETAMINOPHEN 650 MG RE SUPP
650.0000 mg | Freq: Four times a day (QID) | RECTAL | Status: DC | PRN
  Administered 2015-10-15: 650 mg via RECTAL
  Filled 2015-10-14: qty 1

## 2015-10-14 MED ORDER — COUMADIN BOOK
Freq: Once | Status: DC
  Filled 2015-10-14: qty 1

## 2015-10-14 MED ORDER — DIPHENHYDRAMINE HCL 50 MG/ML IJ SOLN: 12.5000 mg | INTRAMUSCULAR | Status: DC | PRN

## 2015-10-14 MED ORDER — FENTANYL CITRATE (PF) 100 MCG/2ML IJ SOLN: 50.0000 ug | INTRAMUSCULAR | Status: DC | PRN

## 2015-10-14 MED ORDER — CITALOPRAM HYDROBROMIDE 10 MG/5ML PO SOLN
20.0000 mg | Freq: Every day | ORAL | Status: DC
  Filled 2015-10-14 (×2): qty 10

## 2015-10-14 MED ORDER — HALOPERIDOL LACTATE 5 MG/ML IJ SOLN: 0.5000 mg | INTRAMUSCULAR | Status: DC | PRN

## 2015-10-14 MED ORDER — COUMADIN BOOK
Freq: Once | Status: AC
  Administered 2015-10-14: 11:00:00
  Filled 2015-10-14: qty 1

## 2015-10-14 NOTE — Progress Notes (Signed)
Occupational Therapy Treatment Patient Details Name: Cristina [name] MRN: [id number] DOB: October 12, 1940 Today's Date: 10/14/2015    History of present illness Pt admitted for ORIF of R elbow. Pt sustained a fx in a fall on 09/15/15. Pt transferred to ICU 9/10 with hypotension, bradycardia and acute respiratory failure, required intubation. Lurline Idol 9/20 as well as CRRT. PMH: recent L ankle fx with ORIF 8/2 with ongoing NWB status, pt had been at [facility] for rehab and discharged home with daughter, afib, asthma, anxiety, CHF, HTN, back surgery.    OT comments  Pt aroused with reposition to eob and reports with head nod "YES" that sitting eob feels good. Pt kicking legs at eob initially freely in response to sitting up. Pt maintained arousal for ~ 5 minutes. Pt fatigued and nodding yes to return to supine. Pt with minimal movement noted in L Ue at scapula. Edema present in bil UE.   Follow Up Recommendations  SNF;Supervision/Assistance - 24 hour    Equipment Recommendations  Hospital bed;Wheelchair cushion (measurements OT);Wheelchair (measurements OT);Other (comment) (hoyer)    Recommendations for Other Services      Precautions / Restrictions Precautions Precautions: Fall Precaution Comments: watch sats Required Braces or Orthoses: Sling (right UE) Restrictions RUE Weight Bearing: Non weight bearing RLE Weight Bearing: Weight bearing as tolerated LLE Weight Bearing: Non weight bearing       Mobility Bed Mobility Overal bed mobility: +2 for physical assistance;Needs Assistance Bed Mobility: Supine to Sit;Sit to Supine;Rolling Rolling: +2 for physical assistance;Total assist   Supine to sit: +2 for physical assistance;Total assist Sit to supine: +2 for physical assistance;Total assist   General bed mobility comments: total assist to rotate trunk and pelvis with rolling x 3, total assist with pad and helicopter technique to pivot to and from EOB  Transfers                       Balance Overall balance assessment: Needs assistance Sitting-balance support: No upper extremity supported;Feet supported Sitting balance-Leahy Scale: Poor Sitting balance - Comments: requirin max to total assist with L side lean and posterior tendency.  Pt shook head it felt good to sit up.  Pt kicking LEs while sitting.  Sat a total of 8 min EOB.  Did not perform reaching to command.  Attempts to try and incr anterior lean with intiaition by pt were unsuccessful.   Postural control: Posterior lean                         ADL                                                Vision                     Perception     Praxis      Cognition   Behavior During Therapy: Flat affect Overall Cognitive Status: Impaired/Different from baseline Area of Impairment: Following commands;Attention Orientation Level: Place;Time;Situation;Disoriented to Current Attention Level: Focused Memory: Decreased short-term memory  Following Commands: Follows one step commands with increased time;Follows one step commands inconsistently       General Comments: pt nodding head Yes x2 and no x2 during session to questions. pt with better response on L ear side    Extremity/Trunk Assessment  Exercises Other Exercises Other Exercises: Daughter present during session and very tearful. she whispered to therapist that patient told her today "I just want to die" . OT acknowledge daughters statement and reported therapy is here to help support and if at any time family or patient no longer want to participate to just let nursing know. After session and patient reposition daughter very grateful for session and thanking therapist for coming.    Shoulder Instructions       General Comments      Pertinent Vitals/ Pain       Pain Assessment: Faces Faces Pain Scale: Hurts little more Pain Location: grimance with BIL LE for helicopter to EOB  Pain  Descriptors / Indicators: Grimacing Pain Intervention(s): Monitored during session;Repositioned  Home Living                                          Prior Functioning/Environment              Frequency  Min 1X/week        Progress Toward Goals  OT Goals(current goals can now be found in the care plan section)  Progress towards OT goals: Not progressing toward goals - comment  Acute Rehab OT Goals Patient Stated Goal: be able to move better OT Goal Formulation: With patient Time For Goal Achievement: 10/26/15 Potential to Achieve Goals: Fair ADL Goals Pt Will Perform Grooming: with set-up;sitting Pt Will Transfer to Toilet: with +2 assist;with mod assist;bedside commode;stand pivot transfer Pt/caregiver will Perform Home Exercise Program: With minimal assist;Both right and left upper extremity Additional ADL Goal #1: Caregiver will be independent in edema managment. Additional ADL Goal #3: Pt will sit EOB x 10 minutes with supervision in preparation for ADL./  Plan Discharge plan remains appropriate    Co-evaluation    PT/OT/SLP Co-Evaluation/Treatment: Yes Reason for Co-Treatment: Complexity of the patient's impairments (multi-system involvement);For patient/therapist safety PT goals addressed during session: Mobility/safety with mobility OT goals addressed during session: ADL's and self-care;Strengthening/ROM      End of Session Equipment Utilized During Treatment: Oxygen   Activity Tolerance Patient limited by fatigue   Patient Left in bed;with call bell/phone within reach;with bed alarm set;with family/visitor present   Nurse Communication Mobility status;Precautions        Time: [id number] OT Time Calculation (min): 19 min  Charges: OT General Charges $OT Visit: 1 Procedure OT Treatments $Therapeutic Activity: 8-22 mins  Parke Poisson B 10/14/2015, 2:38 PM   Jeri Modena   OTR/L Pager: [phone] Office: [phone] .

## 2015-10-14 NOTE — Progress Notes (Signed)
Daily Progress Note   Patient Name: Cristina [name]       Date: 10/14/2015 DOB: 1940-08-18  Age: 75 y.o. MRN#: [id number] Attending Physician: Cherene Altes, MD Primary Care Physician: Tawanna Solo, MD Admit Date: 09/24/2015  Reason for Consultation/Follow-up: Establishing goals of care  75 yo female PMHx of A fib on xarelto, HTN, HLD, Gout, Asthma, Anxiety. had fall and developed Rt distal humerus fx.  Had repair on 9/07.  Developed bradycardia, hypotension, progressive renal failure >> likely from PNA, and medications (beta blocker/calcium channel blocker). Now s/p prolonged critical illness and delirium, severe malnutrition & deconditioning. She had a trach placed 9/20. Subjective:  Met with the patient's 2 daughters and son who are in town in person as well as her daughter from [place] via phone.  We discussed her clinical course as well fact that she continues to decline and appears significantly different today than yesterday.  We discussed pathways moving forward including continuation with current care versus refocusing care on comfort with plans for placement at residential hospice for end-of-life care. Her family is in agreement that if she were to understand her current condition that her wishes would be to transition to full comfort care and work on getting of the hospital to transition to residential hospice as she approaches end-of-life.  Length of Stay: 18  Current Medications: Scheduled Meds:  . arformoterol  15 mcg Nebulization BID  . budesonide (PULMICORT) nebulizer solution  0.5 mg Nebulization BID  . chlorhexidine  15 mL Mouth Rinse BID  . citalopram  20 mg Oral QHS  . mouth rinse  15 mL Mouth Rinse 10 times per day  . pramipexole  2 mg Oral QHS    Continuous  Infusions:    PRN Meds: sodium chloride, acetaminophen **OR** acetaminophen, diphenhydrAMINE, fentaNYL (SUBLIMAZE) injection, haloperidol **OR** haloperidol **OR** haloperidol lactate, ipratropium-albuterol, LORazepam **OR** LORazepam **OR** LORazepam, morphine CONCENTRATE **OR** morphine CONCENTRATE, [DISCONTINUED] ondansetron **OR** ondansetron (ZOFRAN) IV, prochlorperazine **OR** prochlorperazine **OR** prochlorperazine, sorbitol  Physical Exam         General: chronically ill appearing female in NAD Neuro:Much less awake today, does not open eyes, does not follow commands HEENT:trach c/d/i, sutures in place, R IJ HD cath  Cardiac: irregular Chest: non-labored, clear  Abd: soft, non tender Ext: 1+ generalized edema Skin: no rashes  Vital  Signs: BP (!) 164/77   Pulse (!) 110   Temp 100.1 F (37.8 C) (Axillary)   Resp 15   Ht _0  (1.626 m)   Wt 79.3 kg (174 lb 13.2 oz)   SpO2 100%   BMI 30.01 kg/m  SpO2: SpO2: 100 % O2 Device: O2 Device: Tracheostomy Collar O2 Flow Rate: O2 Flow Rate (L/min): 5 L/min  Intake/output summary:   Intake/Output Summary (Last 24 hours) at 10/14/15 1715 Last data filed at 10/14/15 1259  Gross per 24 hour  Intake              850 ml  Output             1750 ml  Net             -900 ml   LBM: Last BM Date: 10/12/15 Baseline Weight: Weight: 81.2 kg (179 lb) Most recent weight: Weight: 79.3 kg (174 lb 13.2 oz)       Palliative Assessment/Data: Currently 10%    Flowsheet Rows   Flowsheet Row Most Recent Value  Intake Tab  Referral Department  Critical care  Unit at Time of Referral  ICU  Palliative Care Primary Diagnosis  Other (Comment)  Palliative Care Type  New Palliative care  Reason for referral  Clarify Goals of Care  Date first seen by Palliative Care  10/11/15  Clinical Assessment  Palliative Performance Scale Score  20%  Pain Max last 24 hours  4  Pain Min Last 24 hours  3  Dyspnea Max Last 24 Hours  4  Dyspnea Min Last  24 hours  3  Nausea Max Last 24 Hours  4  Nausea Min Last 24 Hours  3  Anxiety Max Last 24 Hours  4  Anxiety Min Last 24 Hours  3  Psychosocial & Spiritual Assessment  Palliative Care Outcomes  Patient/Family meeting held?  Yes  Who was at the meeting?  patient, 2 daughters and son in law   Palliative Care Outcomes  Clarified goals of care  Palliative Care follow-up planned  Yes, Facility      Patient Active Problem List   Diagnosis Date Noted  . Acute respiratory failure with hypoxia ([place])   . Goals of care, counseling/discussion   . Palliative care encounter   . Status post tracheostomy ([place])   . Surgery, elective   . History of ETT   . Acute hypoxemic respiratory failure ([place])   . Pressure ulcer 10/03/2015  . Persistent atrial fibrillation ([place])   . Encounter for central line placement   . Elevated troponin   . Orthostatic hypotension   . Hypotension due to drugs 09/27/2015  . Bradycardia   . Acute respiratory failure ([place])   . S/P ORIF (open reduction internal fixation) fracture 08/19/2015  . Anaphylactic reaction 01/23/2014  . Essential hypertension 08/07/2013  . Atrial fibrillation ([place]) 07/28/2013  . Hypertensive heart disease 07/28/2013  . Chronic diastolic congestive heart failure ([place]) 07/28/2013  . Obesity (BMI 30-39.9)   . Gout   . Current use of long term anticoagulation   . Acute diastolic CHF (congestive heart failure) ([place]) 07/24/2013    Palliative Care Assessment & Plan   Assessment:  recent R humerus Fx s/p repair Recent VDRF s/p trach.  Recent renal failure, now off CRRT Deconditioning, generalized   Recommendations/Plan: PLAN: Patient continues to decline and acutely worse today.  Met with her children and they are in agreement that her wishes would be to pursue comfort moving  forward. Updated CODE STATUS to DO NOT RESUSCITATE. Family would like to pursue placement at [facility] for end-of-life care. Consult placed to social work to  facilitate arrangement for residential hospice.  Code Status:    Code Status Orders        Start     Ordered   10/06/15 1500  Limited resuscitation (code)  Continuous    Question Answer Comment  In the event of cardiac or respiratory ARREST: Perform CPR No   In the event of cardiac or respiratory ARREST: Perform Intubation/Mechanical Ventilation Yes   In the event of cardiac or respiratory ARREST: Perform Defibrillation or Cardioversion if indicated No   Antiarrhythmic drugs - Any drug used to treat arrhythmias No   Vasoactive drug - Any drug used to stabilize blood pressure Yes      10/06/15 1459    Code Status History    Date Active Date Inactive Code Status Order ID Comments User Context   10/06/2015  2:51 PM 10/06/2015  2:59 PM DNR [id number]  Erick Colace, NP Inpatient   09/27/2015  3:08 PM 10/06/2015  2:51 PM Full Code [id number]  Rush Landmark, MD Inpatient   09/24/2015  6:51 PM 09/27/2015  3:08 PM Full Code [id number]  Leandrew Koyanagi, MD Inpatient   08/19/2015  3:20 PM 08/21/2015  6:19 PM Full Code [id number]  Leandrew Koyanagi, MD Inpatient   01/24/2014 12:19 AM 01/24/2014  6:16 PM Full Code [id number]  Rhetta Mura Schorr, NP Inpatient   01/23/2014  7:50 PM 01/24/2014 12:19 AM DNR [id number]  Nita Sells, MD Inpatient   07/22/2013 11:50 PM 07/28/2013  1:52 PM Full Code [id number]  Lonn Georgia, PA-C Inpatient       Prognosis:   Patient has been declining and is acutely worse today. Plan transition to full comfort including stopping of tube feeds.  Her prognosis is less than 2 weeks.  Discharge Planning:  Hospice facility  Care plan was discussed with 3 daughters, Dr Thereasa Solo, Social Work and Erling Conte.  I also discussed case earlier today with Jeanella Craze from ethics committee, but there is now consensus from family that her goal moving forward is focus on comfort.   Thank you for allowing the Palliative Medicine Team to assist in the care of this patient.   Time In: 1500  Time Out: 1540 Total Time 40 Prolonged Time Billed No      Greater than 50%  of this time was spent counseling and coordinating care related to the above assessment and plan.  Micheline Rough, MD [phone]  Please contact Palliative Medicine Team phone at [phone] for questions and concerns.

## 2015-10-14 NOTE — Progress Notes (Signed)
Subjective: Interval History: has no complaint, more responsive.  Objective: Vital signs in last 24 hours: Temp:  [98 F (36.7 C)-100.9 F (38.3 C)] 100.7 F (38.2 C) (09/27 0355) Pulse Rate:  [90-121] 97 (09/27 0355) Resp:  [14-22] 22 (09/27 0355) BP: (105-175)/(69-96) 153/72 (09/27 0355) SpO2:  [88 %-100 %] 100 % (09/27 0355) FiO2 (%):  [30 %] 30 % (09/26 1638) Weight:  [79.3 kg (174 lb 13.2 oz)] 79.3 kg (174 lb 13.2 oz) (09/27 0355) Weight change: 1 kg (2 lb 3.3 oz)  Intake/Output from previous day: 09/26 0701 - 09/27 0700 In: 830 [NG/GT:730] Out: 1730 [Urine:1730] Intake/Output this shift: No intake/output data recorded.  General appearance: moderately obese, pale and opens eyes and acknowledges,   Neck: trach, RIJ cath Resp: diminished breath sounds bilaterally and rhonchi bibasilar Cardio: irregularly irregular rhythm, S1, S2 normal and systolic murmur: holosystolic 2/6, blowing at apex GI: obese, pos bs, soft Extremities: edema 1+ and R arm sore  Lab Results:  Recent Labs  10/12/15 0219 10/13/15 0315  WBC 15.5* 14.9*  HGB 7.8* 8.0*  HCT 25.9* 26.5*  PLT 291 328   BMET:  Recent Labs  10/13/15 0315 10/14/15 0430  NA 147*  147* 151*  K 3.5  3.4* 3.5  CL 106  105 104  CO2 31  31 33*  GLUCOSE 144*  145* 184*  BUN 92*  92* 78*  CREATININE 2.55*  2.56* 2.23*  CALCIUM 9.6  9.6 9.7   No results for input(s): PTH in the last 72 hours. Iron Studies: No results for input(s): IRON, TIBC, TRANSFERRIN, FERRITIN in the last 72 hours.  Studies/Results: No results found.  I have reviewed the patient's current medications.  Assessment/Plan: 1 AKI improving GFR.  Baseline .7 2 ^ S Na,  Just started free water in TF. IF worsens use iv free water.  Some related to post ATN hyponatremic diuresis 3 DM  4 Resp failure Trach per pulm 5 Afib 6 Arm fx P will S/O for now and see again at your request    LOS: 18 days   Alayla Dethlefs L 10/14/2015,7:34  AM

## 2015-10-14 NOTE — Progress Notes (Signed)
Physical Therapy Treatment Patient Details Name: Cristina [name] MRN: [id number] DOB: November 08, 1940 Today's Date: 10/14/2015    History of Present Illness Pt admitted for ORIF of R elbow. Pt sustained a fx in a fall on 09/15/15. Pt transferred to ICU 9/10 with hypotension, bradycardia and acute respiratory failure, required intubation. Lurline Idol 9/20 as well as CRRT. PMH: recent L ankle fx with ORIF 8/2 with ongoing NWB status, pt had been at [facility] for rehab and discharged home with daughter, afib, asthma, anxiety, CHF, HTN, back surgery.     PT Comments    Pt admitted with above diagnosis. Pt currently with functional limitations due to balance and endurance deficits as well as decr participation.  Pt total assist for mobility.  Pt does follow occasional commands but fatigues very quickly.  Daughter present and very tearful during session telling OT that pt told her she didn't want to live this way.  Will check back Friday to determine pt and family wishes as this PT unsure if therapy is desired. Pt will benefit from skilled PT to increase their independence and safety with mobility to allow discharge to the venue listed below.    Follow Up Recommendations  SNF;Supervision/Assistance - 24 hour     Equipment Recommendations  Hospital bed    Recommendations for Other Services       Precautions / Restrictions Precautions Precautions: Fall Precaution Comments: watch sats Required Braces or Orthoses: Sling (right UE) Restrictions RUE Weight Bearing: Non weight bearing RLE Weight Bearing: Weight bearing as tolerated LLE Weight Bearing: Non weight bearing    Mobility  Bed Mobility Overal bed mobility: +2 for physical assistance;Needs Assistance Bed Mobility: Supine to Sit;Sit to Supine;Rolling Rolling: +2 for physical assistance;Total assist   Supine to sit: +2 for physical assistance;Total assist Sit to supine: +2 for physical assistance;Total assist   General bed mobility comments:  total assist to rotate trunk and pelvis with rolling x 3, total assist with pad and helicopter technique to pivot to and from EOB  Transfers                    Ambulation/Gait                 Stairs            Wheelchair Mobility    Modified Rankin (Stroke Patients Only)       Balance Overall balance assessment: Needs assistance;History of Falls Sitting-balance support: No upper extremity supported;Feet supported Sitting balance-Leahy Scale: Poor Sitting balance - Comments: requirin max to total assist with L side lean and posterior tendency.  Pt shook head it felt good to sit up.  Pt kicking LEs while sitting.  Sat a total of 8 min EOB.  Did not perform reaching to command.  Attempts to try and incr anterior lean with intiaition by pt were unsuccessful.   Postural control: Posterior lean;Left lateral lean                          Cognition Arousal/Alertness: Awake/alert Behavior During Therapy: Flat affect Overall Cognitive Status: Impaired/Different from baseline Area of Impairment: Orientation;Following commands Orientation Level: Place;Time;Situation;Disoriented to   Memory: Decreased short-term memory Following Commands: Follows one step commands with increased time            Exercises      General Comments General comments (skin integrity, edema, etc.): Notified nurse to reinforce the tape on the NG tube.  Daughter present and tearful.       Pertinent Vitals/Pain Pain Assessment: Faces Pain Location: when moving body and feet Pain Descriptors / Indicators: Grimacing;Guarding;Aching Pain Intervention(s): Limited activity within patient's tolerance;Monitored during session;Premedicated before session;Repositioned  VSS    Home Living                      Prior Function            PT Goals (current goals can now be found in the care plan section) Progress towards PT goals: Not progressing toward goals - comment  (decr awareness and fatigues quickly)    Frequency    Min 3X/week      PT Plan Current plan remains appropriate    Co-evaluation PT/OT/SLP Co-Evaluation/Treatment: Yes Reason for Co-Treatment: Complexity of the patient's impairments (multi-system involvement) PT goals addressed during session: Mobility/safety with mobility       End of Session Equipment Utilized During Treatment: Other (comment) (right UE sling) Activity Tolerance: Patient limited by fatigue Patient left: in bed;with call bell/phone within reach;with bed alarm set;with family/visitor present;with SCD's reapplied     Time: [id number] PT Time Calculation (min) (ACUTE ONLY): 20 min  Charges:  $Therapeutic Activity: 8-22 mins                    G CodesIrwin Brakeman F 10-19-2015, 2:04 PM Kaytee Taliercio,PT Acute Rehabilitation [phone] (pager)

## 2015-10-14 NOTE — Consult Note (Signed)
[facility] [employer]  Received request from [employer] for family interest in [place]. Met briefly with daughter Langley Gauss to acknowledge request and confirm interest. Langley Gauss is aware [facility] liaison will follow up with family in the morning. CSW [place] and Dr. Thereasa Solo aware.   Thank you,  Erling Conte, LCSW [phone]

## 2015-10-14 NOTE — Ethics Note (Signed)
Initial Ethics Note:  Discussed patient's medical situation and prognosis with Dr. Domingo Cocking including the varying views of patient's children regarding what is in her best interest and what she might find acceptable or not regarding her medical condition and prognosis.   The plan is for Dr. Domingo Cocking and other attending physicians to attempt to discuss patient's current condition and prognosis with all children at the same time to hopefully gain clarity about the patient's wishes given her medical condition and future if her potential for recovery to an acceptable functional state could not happen.   The Ethics Consult service is available to meet with family for this or future conversation.   Cristina Modest, PhD will be the consultant carrying the ethics pager from this point. I have updated him regarding my conversation with Dr. Domingo Cocking.  Ethics Pager [phone]    Cristina [name], M.Glass blower/designer

## 2015-10-14 NOTE — Progress Notes (Signed)
TEAM 1 - Stepdown/ICU TEA  TORREN DURNAN  [id number] DOB: 1940/12/09 DOA: 09/24/2015 PCP: Tawanna Solo, MD    Brief Narrative:  75 yo female w/ a Hx of A fib on xarelto, HTN, HLD, Gout, Asthma, and Anxiety who had a fall and developed a Rt distal humerus fx. This was repaired on 9/07. Following surgery she developed bradycardia, hypotension, and progressive renal failure likely from PNA, and medications (beta blocker/calcium channel blocker). She is now s/p prolonged critical illness and delirium, with severe malnutrition &deconditioning. She had a trach placed 9/20 and has been tolerating pressure support with trach collar.  Subjective: The pt is non-communicative today. She does not appear to be in acute resp distress or discomfort.    Palliative care has worked extensively with the family and 3 of the 4 children now agree that comfort focused care is most appropriate.  The medical team also agrees that comfort focused care is appropriate.  Our goal at this time is to pursue placement at [facility] as soon as possible.  Assessment & Plan:  Acute hypoxic respiratory failure 2nd to HCAP and acute pulmonary edema was on prolonged vent support - underwent tracheostomy placement on 10/07/2015 - weaned down to trach collar 10/10/2015 - finished her antibiotic treatment - has been diuresed   Acute delirium not much meaningful recovery - encephalopathic and severely deconditioned - there have been no acutely reversible factors identified which would lead me to believe that we can reverse her current mental status - the majority of the family now favor comfort focused care and I believe this is in her best interest - referral for residential hospice placement has been made  Hypernatremia  Due to simple free water deficit  Shock due to combination of sepsis and beta blocker/calcium channel blocker induced bradycardia Resolved  Mild troponin elevation  While under the  care of critical care due to demand ischemia - seen by Cardiology - TTE noted EF of 55% and chronic diastolic CHF - on aspirin, statin and moderate dose beta blocker   Chronic atrial fibrillation CHA2DS2 - VASc at least 4 - on beta blocker - has been on Coumadin and INR is therapeutic   ARF ATN secondary to shock and ischemia - underwent dialysis and CRRT this admission - Nephrology now signing off - crt has stabilized in the 2.0-2.5 range   Right humerus fracture status post surgical correction by Dr. Erlinda Hong - activity and wound management per Orthopedics  Anemia of chronic disease along with anemia of critical care illness Hgb stable   Relative adrenal insufficiency earlier in admission while she was in shock - has finished steroid support  Shock liver due to hypotension After supportive care for hypertension liver function has normalized  Mild hypokalemia Replaced  Goals of Care 3 of the patient's 4 children now agree that comfort focused care is most consistent with what she would desire - the medical team agrees that this is in her best interest - the plan is for transfer to [facility] for residential hospice care as soon as arrangements can be completed   DVT prophylaxis: warfarin Code Status: DNR - NO CODE Family Communication: no family present at time of exam  Disposition Plan: comfort care - transfer to [facility] when bed available/arrangements complete   Consultants:  PCCM Palliative Care  Significant Events: 9/07 admit after fall. Had repair of right shoulder surgery. 9/10 PCCM consulted for hypotension, bradycardia. Intubated for airway. Xarelto stopped. 9/12 Renal consulted,  start CRRT 9/19 Family meeting >> limited resuscitation; off pressors 9/20 trach 9/21 stopped precedex 9/23 off vent overnight, weaned to 28% ATC   Antimicrobials:  None presently   Objective: Blood pressure (!) 164/77, pulse (!) 110, temperature (!) 100.5 F (38.1 C),  temperature source Axillary, resp. rate 15, height 5\' 4"  (1.626 m), weight 79.3 kg (174 lb 13.2 oz), SpO2 100 %.  Intake/Output Summary (Last 24 hours) at 10/14/15 1535 Last data filed at 10/14/15 1259  Gross per 24 hour  Intake              950 ml  Output             1900 ml  Net             -950 ml   Filed Weights   10/12/15 0427 10/13/15 0600 10/14/15 0355  Weight: 79.7 kg (175 lb 11.3 oz) 78.3 kg (172 lb 9.9 oz) 79.3 kg (174 lb 13.2 oz)    Examination: General: No acute respiratory distress - essentially unresponsive  Lungs: Clear to auscultation bilaterally - no wheezes or crackles Cardiovascular: Regular rate without murmur  Abdomen: Nondistended, soft, no ascites, no appreciable mass Extremities: No significant cyanosis, clubbing, or edema bilateral lower extremities  CBC:  Recent Labs Lab 10/09/15 0452 10/10/15 0500 10/11/15 0500 10/12/15 0219 10/13/15 0315  WBC 12.7* 14.7* 18.1* 15.5* 14.9*  HGB 7.3* 7.4* 7.6* 7.8* 8.0*  HCT 23.8* 24.0* 24.8* 25.9* 26.5*  MCV 88.1 89.6 90.8 92.2 92.7  PLT 168 202 260 291 [id number]   Basic Metabolic Panel:  Recent Labs Lab 10/09/15 0452  10/10/15 0500 10/11/15 0500 10/12/15 0219 10/13/15 0315 10/14/15 0430  NA 136  < > 139 141 142 147*  147* 151*  K 3.4*  < > 3.6 3.5 3.6 3.5  3.4* 3.5  CL 102  < > 103 100* 102 106  105 104  CO2 25  < > 27 29 30 31  31  33*  GLUCOSE 123*  < > 108* 184* 158* 144*  145* 184*  BUN 81*  < > 106* 112* 105* 92*  92* 78*  CREATININE 2.79*  < > 3.27* 3.47* 3.09* 2.55*  2.56* 2.23*  CALCIUM 9.2  < > 9.3 9.1 9.1 9.6  9.6 9.7  MG 2.2  --  2.0 2.0 1.8 2.2  --   PHOS 1.1*  < > 4.7* 4.2 3.5 3.4  3.4 3.3  < > = values in this interval not displayed.   GFR: Estimated Creatinine Clearance: 21.9 mL/min (by C-G formula based on SCr of 2.23 mg/dL (H)).  Liver Function Tests:  Recent Labs Lab 10/08/15 0425  10/10/15 0500 10/11/15 0500 10/12/15 0219 10/13/15 0315 10/14/15 0430  AST 20  --    --   --   --  27  --   ALT 92*  --   --   --   --  46  --   ALKPHOS 109  --   --   --   --  117  --   BILITOT 0.8  --   --   --   --  0.8  --   PROT 5.1*  --   --   --   --  5.2*  --   ALBUMIN 2.8*  < > 2.8* 2.6* 2.5* 2.6*  2.6* 2.6*  < > = values in this interval not displayed.  Coagulation Profile:  Recent Labs Lab 10/10/15 0500 10/11/15 0500 10/12/15 0219 10/13/15  0315 10/14/15 0430  INR 1.48 1.82 2.17 2.32 2.58    HbA1C: Hgb A1c MFr Bld  Date/Time Value Ref Range Status  07/23/2013 12:37 AM 6.0 (H) <5.7 % Final    Comment:    (NOTE)                                                                       According to the ADA Clinical Practice Recommendations for 2011, when HbA1c is used as a screening test:  >=6.5%   Diagnostic of Diabetes Mellitus           (if abnormal result is confirmed) 5.7-6.4%   Increased risk of developing Diabetes Mellitus References:Diagnosis and Classification of Diabetes Mellitus,Diabetes [id number] 1):S62-S69 and Standards of Medical Care in         Diabetes - 2011,Diabetes Care,2011,34 (Suppl 1):S11-S61.    CBG:  Recent Labs Lab 10/13/15 2018 10/14/15 0019 10/14/15 0357 10/14/15 0757 10/14/15 1252  GLUCAP 153* 174* 161* 152* 146*     Scheduled Meds: . arformoterol  15 mcg Nebulization BID  . aspirin  81 mg Oral Daily  . atorvastatin  20 mg Oral q1800  . budesonide (PULMICORT) nebulizer solution  0.5 mg Nebulization BID  . chlorhexidine  15 mL Mouth Rinse BID  . citalopram  20 mg Per Tube QHS  . free water  250 mL Per Tube Q8H  . insulin aspart  0-20 Units Subcutaneous Q4H  . mouth rinse  15 mL Mouth Rinse 10 times per day  . metoprolol tartrate  75 mg Oral BID  . pantoprazole sodium  40 mg Per Tube Daily  . pramipexole  2 mg Oral QHS  . Vitamin D (Ergocalciferol)  50,000 Units Oral Q7 days  . warfarin  5 mg Oral ONCE-1800  . warfarin   Does not apply Once  . Warfarin - Pharmacist Dosing Inpatient   Does not  apply q1800   Continuous Infusions: . feeding supplement (VITAL 1.5 CAL) 1,000 mL (10/13/15 1537)     LOS: 18 days   Cherene Altes, MD [facility] Office  [phone] Pager - Text Page per Amion as per below:  On-Call/Text Page:      [email or web address]      password [email or web address]  If 7PM-7AM, please contact night-coverage [email or web address] Password [facility] 10/14/2015, 3:35 PM

## 2015-10-14 NOTE — Progress Notes (Signed)
SLP Cancellation Note  Patient Details Name: RUTHANNE WOLFLEY MRN: [id number] DOB: [birth year]   Cancelled treatment:       Reason Eval/Treat Not Completed: Fatigue/lethargy limiting ability to participate;Patient's level of consciousness  Gabriel Rainwater MA, CCC-SLP [phone]  Dorenda Pfannenstiel [name] 10/14/2015, 10:14 AM

## 2015-10-14 NOTE — Care Management Important Message (Signed)
Important Message  Patient Details  Name: Cristina [name] MRN: [id number] Date of Birth: October 10, 1940   Medicare Important Message Given:  Yes    Nathen May 10/14/2015, 10:32 AM

## 2015-10-14 NOTE — Progress Notes (Signed)
.   ANTICOAGULATION CONSULT NOTE - Follow-up Consult  Pharmacy Consult for warfarin Indication: atrial fibrillation  Patient Measurements: Height: 5\' 4"  (162.6 cm) Weight: 174 lb 13.2 oz (79.3 kg) IBW/kg (Calculated) : 54.7 Heparin Dosing Weight: 76  Vital Signs: Temp: 100.6 F (38.1 C) (09/27 0738) Temp Source: Oral (09/27 0738) BP: 165/89 (09/27 0808) Pulse Rate: 120 (09/27 0738)  Labs:  Recent Labs  10/12/15 0219 10/13/15 0315 10/14/15 0430  HGB 7.8* 8.0*  --   HCT 25.9* 26.5*  --   PLT 291 328  --   LABPROT 24.5* 25.9* 28.2*  INR 2.17 2.32 2.58  HEPARINUNFRC 0.49  --   --   CREATININE 3.09* 2.55*  2.56* 2.23*    Estimated Creatinine Clearance: 21.9 mL/min (by C-G formula based on SCr of 2.23 mg/dL (H)).   Assessment: 17 yoF with h/o afib on Xarelto PTA (last dose 9/10). Pt had AKI and is not a good candidate for re-initiation of Xarelto therapy. Baseline INR 1.19 on 9/19. Pt transitioned to Coumadin 9/21. INR therapeutic 9/25 and heparin discontinued, remains therapeutic today at 2.58. Hgb 8.0, plt 328, no S/Sx bleeding noted  Goal of Therapy:  INR 2-3   Plan:  - Coumadin 5 mg PO x 1 tonight  - Daily INR, CBC - Monitor closely for s/sx bleeding   Juanell Fairly, PharmD Candidate 10/14/2015

## 2015-10-15 DIAGNOSIS — K7200 Acute and subacute hepatic failure without coma: Secondary | ICD-10-CM

## 2015-10-15 DIAGNOSIS — S42411A Displaced simple supracondylar fracture without intercondylar fracture of right humerus, initial encounter for closed fracture: Secondary | ICD-10-CM

## 2015-10-15 DIAGNOSIS — R410 Disorientation, unspecified: Secondary | ICD-10-CM

## 2015-10-15 DIAGNOSIS — J189 Pneumonia, unspecified organism: Secondary | ICD-10-CM

## 2015-10-15 DIAGNOSIS — N179 Acute kidney failure, unspecified: Secondary | ICD-10-CM

## 2015-10-15 DIAGNOSIS — I4891 Unspecified atrial fibrillation: Secondary | ICD-10-CM

## 2015-10-15 LAB — GLUCOSE, CAPILLARY
GLUCOSE-CAPILLARY: 132 mg/dL — AB (ref 65–99)
GLUCOSE-CAPILLARY: 108 mg/dL — AB (ref 65–99)

## 2015-10-15 MED ORDER — ACETAMINOPHEN 650 MG RE SUPP: 650.0000 mg | Freq: Four times a day (QID) | RECTAL | 0 refills | Status: AC | PRN

## 2015-10-15 MED ORDER — PROCHLORPERAZINE EDISYLATE 5 MG/ML IJ SOLN: 10.0000 mg | Freq: Two times a day (BID) | INTRAMUSCULAR | 0 refills | Status: AC | PRN

## 2015-10-15 MED ORDER — DIPHENHYDRAMINE HCL 50 MG/ML IJ SOLN: 12.5000 mg | INTRAMUSCULAR | 0 refills | Status: AC | PRN

## 2015-10-15 MED ORDER — BUDESONIDE 0.5 MG/2ML IN SUSP: 0.5000 mg | Freq: Two times a day (BID) | RESPIRATORY_TRACT | 12 refills | Status: AC

## 2015-10-15 MED ORDER — OXYCODONE HCL ER 10 MG PO T12A: 10.0000 mg | EXTENDED_RELEASE_TABLET | Freq: Two times a day (BID) | ORAL | 0 refills | Status: AC

## 2015-10-15 MED ORDER — ARFORMOTEROL TARTRATE 15 MCG/2ML IN NEBU: 15.0000 ug | INHALATION_SOLUTION | Freq: Two times a day (BID) | RESPIRATORY_TRACT | 0 refills | Status: AC

## 2015-10-15 MED ORDER — IPRATROPIUM-ALBUTEROL 0.5-2.5 (3) MG/3ML IN SOLN: 3.0000 mL | RESPIRATORY_TRACT | 0 refills | Status: AC | PRN

## 2015-10-15 MED ORDER — HALOPERIDOL LACTATE 5 MG/ML IJ SOLN: 0.5000 mg | INTRAMUSCULAR | 0 refills | Status: AC | PRN

## 2015-10-15 MED ORDER — FENTANYL CITRATE (PF) 100 MCG/2ML IJ SOLN: 25.0000 ug | INTRAMUSCULAR | 0 refills | Status: AC | PRN

## 2015-10-15 MED ORDER — CHLORHEXIDINE GLUCONATE 0.12 % MT SOLN: 15.0000 mL | Freq: Two times a day (BID) | OROMUCOSAL | 0 refills | Status: AC

## 2015-10-15 MED ORDER — LORAZEPAM 2 MG/ML IJ SOLN
1.0000 mg | INTRAMUSCULAR | 0 refills | Status: AC | PRN
Start: 2015-10-15 — End: ?

## 2015-10-15 NOTE — Discharge Summary (Signed)
Physician Discharge Summary  Cristina [name] [id number] DOB: Sep 10, 1940 DOA: 09/24/2015  PCP: Tawanna Solo, MD  Admit date: 09/24/2015 Discharge date: 10/15/2015  Time spent: 35 minutes  Recommendations for Outpatient Follow-up:   Acute hypoxic respiratory failure 2nd to HCAP and acute pulmonary edema -was on prolonged vent support - underwent tracheostomy placement on 10/07/2015 - weaned down to trach collar 10/10/2015  - finished her antibiotic treatment - has been diuresed   Acute delirium -not much meaningful recovery  - encephalopathic and severely deconditioned  - there have been no acutely reversible factors identified which would lead me to believe that we can reverse her current mental status  Hypernatremia  -Due to simple free water deficit  Shock due to combination of sepsis and beta blocker/calcium channel blocker induced bradycardia -Resolved  Mild troponin elevation  -While under the care of critical care due to demand ischemia - seen by Cardiology - TTEnoted EF of 55% and chronic diastolic CHF - on aspirin, statin and moderate dose beta blocker   Chronic atrial fibrillation -CHA2DS2 - VASc at least 4 - on beta blocker - has been on Coumadin and INR is therapeutic   ARF -ATN secondary to shock and ischemia  - underwent dialysis and CRRT this admission - Nephrology now signing off  - crt has stabilized in the 2.0-2.5 range   Right humerus fracture -status post surgical correction by Dr. Erlinda Hong - activity and wound management per Orthopedics  Anemia of chronic disease along with anemia of critical care illness Hgb stable   Relative adrenal insufficiency -earlier in admission while she was in shock - has finished steroid support  Shock liver due to hypotension -After supportive care for hypertension liver function has normalized  Goals of Care -3 of the patient's 4 children now agree that comfort focused care is most consistent with what she  would desire  - Discharge to [facility] for residential hospice care     Discharge Diagnoses:  Active Problems:   S/P ORIF (open reduction internal fixation) fracture   Hypotension due to drugs   Bradycardia   Acute respiratory failure (HCC)   Elevated troponin   Orthostatic hypotension   Encounter for central line placement   Persistent atrial fibrillation (HCC)   Pressure ulcer   Acute hypoxemic respiratory failure (HCC)   Status post tracheostomy ([place])   Surgery, elective   History of ETT   Acute respiratory failure with hypoxia (HCC)   Goals of care, counseling/discussion   Palliative care encounter   Acute delirium   Acute renal failure ([place])   Right supracondylar humerus fracture   Shock liver   HCAP (healthcare-associated pneumonia)   Discharge Condition: Guarded  Diet recommendation: Nothing by mouth  Filed Weights   10/12/15 0427 10/13/15 0600 10/14/15 0355  Weight: 79.7 kg (175 lb 11.3 oz) 78.3 kg (172 lb 9.9 oz) 79.3 kg (174 lb 13.2 oz)    History of present illness:  75 yo WF PMHx  A- fib on xarelto, HTN, HLD, Gout, Asthma, and Anxiety who had a fall and developed a Rt distal humerus fx. This was repairedon 9/07. Following surgery she developed bradycardia, hypotension, and progressive renal failure likely from PNA, and medications (beta blocker/calcium channel blocker). She is now s/p prolonged critical illness and delirium, with severe malnutrition &deconditioning. She had a trach placed 9/20 and has been tolerating pressure support with trach collar.  During his hospitalization patient was treated for acute respiratory failure with hypoxia secondary to HCAP/sepsis. Despite maximal  treatment patient's condition continued to decrease, resulting in patient undergoing tracheostomy placement. Currently patient minimally/nonresponsive. Family has decided on Inpatient Comfort Care.    Consultants:  [facility]    Procedures/Significant  Events:  9/07 admit after fall. Had repair of right shoulder surgery. 9/10 PCCM consulted for hypotension, bradycardia. Intubated for airway. Xarelto stopped. 9/12 Renal consulted, start CRRT 9/19 Family meeting >> limited resuscitation; off pressors 9/20 trach 9/21 stopped precedex 9/23 off vent overnight, weaned to 28% ATC    Discharge Exam: Vitals:   10/15/15 0700 10/15/15 0825 10/15/15 1126 10/15/15 1237  BP: (!) 193/95 (!) 193/95  (!) 188/85  Pulse: (!) 122 (!) 123 (!) 133 (!) 129  Resp: 18 (!) 21 (!) 22 17  Temp: (!) 101.7 F (38.7 C)   (!) 102.6 F (39.2 C)  TempSrc: Axillary   Axillary  SpO2: 100% 100% 98% 98%  Weight:      Height:        General: Withdraws minimally to painful stimuli, otherwise unresponsive. Cardiovascular: Tachycardic, regular rhythm, negative murmurs rubs or gallops, normal S1/S2 Respiratory: Diffuse coarse breath sounds, negative wheezing, negative crackles.    Discharge Instructions  Discharge Instructions    Call MD / Call 911    Complete by:  As directed    If you experience chest pain or shortness of breath, CALL 911 and be transported to the hospital emergency room.  If you develope a fever above 101.5 F, pus (white drainage) or increased drainage or redness at the wound, or calf pain, call your surgeon's office.   Constipation Prevention    Complete by:  As directed    Drink plenty of fluids.  Prune juice may be helpful.  You may use a stool softener, such as Colace (over the counter) 100 mg twice a day.  Use MiraLax (over the counter) for constipation as needed.   Diet - low sodium heart healthy    Complete by:  As directed    Diet general    Complete by:  As directed    Driving restrictions    Complete by:  As directed    No driving while taking narcotic pain meds.   Increase activity slowly as tolerated    Complete by:  As directed        Medication List    STOP taking these medications   ALPRAZolam 0.25 MG  tablet Commonly known as:  XANAX   oxyCODONE-acetaminophen 5-325 MG tablet Commonly known as:  PERCOCET     TAKE these medications   acetaminophen 650 MG suppository Commonly known as:  TYLENOL Place 1 suppository (650 mg total) rectally every 6 (six) hours as needed for mild pain (or Fever >/= 101).   arformoterol 15 MCG/2ML Nebu Commonly known as:  BROVANA Take 2 mLs (15 mcg total) by nebulization 2 (two) times daily.   atorvastatin 20 MG tablet Commonly known as:  LIPITOR Take 20 mg by mouth at bedtime.   budesonide 0.5 MG/2ML nebulizer solution Commonly known as:  PULMICORT Take 2 mLs (0.5 mg total) by nebulization 2 (two) times daily.   chlorhexidine 0.12 % solution Commonly known as:  PERIDEX 15 mLs by Mouth Rinse route 2 (two) times daily.   citalopram 20 MG tablet Commonly known as:  CELEXA Take 20 mg by mouth at bedtime.   colchicine 0.6 MG tablet Take 0.6 mg by mouth 2 (two) times daily as needed (for gout flares).   diphenhydrAMINE 50 MG/ML injection Commonly known as:  BENADRYL Inject 0.25 mLs (12.5 mg total) into the vein every 4 (four) hours as needed for itching.   fentaNYL 100 MCG/2ML injection Commonly known as:  SUBLIMAZE Inject 0.5 mLs (25 mcg total) into the vein every 30 (thirty) minutes as needed for severe pain (or dyspnea).   haloperidol lactate 5 MG/ML injection Commonly known as:  HALDOL Inject 0.1 mLs (0.5 mg total) into the vein every 4 (four) hours as needed (or delirium).   ipratropium-albuterol 0.5-2.5 (3) MG/3ML Soln Commonly known as:  DUONEB Take 3 mLs by nebulization every 2 (two) hours as needed.   LORazepam 2 MG/ML injection Commonly known as:  ATIVAN Inject 0.5 mLs (1 mg total) into the vein every 4 (four) hours as needed for anxiety.   methocarbamol 750 MG tablet Commonly known as:  ROBAXIN Take 1 tablet (750 mg total) by mouth 2 (two) times daily as needed for muscle spasms.   metoprolol succinate 100 MG 24 hr  tablet Commonly known as:  TOPROL-XL Take 100 mg by mouth at bedtime. Take with or immediately following a meal.   naproxen sodium 220 MG tablet Commonly known as:  ANAPROX Take 440 mg by mouth 2 (two) times daily as needed (for pain).   ondansetron 4 MG tablet Commonly known as:  ZOFRAN Take 1-2 tablets (4-8 mg total) by mouth every 8 (eight) hours as needed for nausea or vomiting.   oxyCODONE 10 mg 12 hr tablet Commonly known as:  OXYCONTIN Take 1 tablet (10 mg total) by mouth every 12 (twelve) hours.   pramipexole 1 MG tablet Commonly known as:  MIRAPEX Take 2 mg by mouth at bedtime.   prochlorperazine 5 MG/ML injection Commonly known as:  COMPAZINE Inject 2 mLs (10 mg total) into the vein every 12 (twelve) hours as needed.   rivaroxaban 20 MG Tabs tablet Commonly known as:  XARELTO Take 1 tablet (20 mg total) by mouth daily with supper.   senna-docusate 8.6-50 MG tablet Commonly known as:  SENOKOT S Take 1 tablet by mouth at bedtime as needed.   verapamil 240 MG CR tablet Commonly known as:  CALAN-SR Take 240 mg by mouth at bedtime.   Vitamin D (Ergocalciferol) 50000 units Caps capsule Commonly known as:  DRISDOL Take 50,000 Units by mouth every 7 (seven) days. Pt takes on Tuesday.      Allergies  Allergen Reactions  . Augmentin [Amoxicillin-Pot Clavulanate] Anaphylaxis and Other (See Comments)    Has patient had a PCN reaction causing immediate rash, facial/tongue/throat swelling, SOB or lightheadedness with hypotension: Yes Has patient had a PCN reaction causing severe rash involving mucus membranes or skin necrosis: No Has patient had a PCN reaction that required hospitalization No Has patient had a PCN reaction occurring within the last 10 years: Yes If all of the above answers are "NO", then may proceed with Cephalosporin use.  [name] Kitchen Lisinopril Anaphylaxis and Cough   Follow-up Information    Marianna Payment, MD Follow up in 2 week(s).   Specialty:   Orthopedic Surgery Why:  For suture removal, For wound re-check Contact information: [address] [place] [id number] [phone]            The results of significant diagnostics from this hospitalization (including imaging, microbiology, ancillary and laboratory) are listed below for reference.    Significant Diagnostic Studies: Dg Elbow 2 Views Right  Result Date: 09/24/2015 CLINICAL DATA:  ORIF. EXAM: RIGHT ELBOW - 2 VIEW; DG C-ARM GT 120 MIN COMPARISON:  CT 09/15/2015. FINDINGS: ORIF distal humeral fracture. Hardware intact. Anatomic alignment. ORIF proximal ulna . Hardware intact. Anatomic alignment. Two images obtained. 0 minutes 23 seconds fluoroscopy time. IMPRESSION: Postsurgical changes about the right elbow . Electronically Signed   By: Marcello Moores  Register   On: 09/24/2015 16:19   Dg Elbow 2 Views Right  Result Date: 09/15/2015 CLINICAL DATA:  Right elbow pain and bruising posteriorly after a fall today. EXAM: RIGHT ELBOW - 2 VIEW COMPARISON:  None. FINDINGS: Oblique comminuted fracture demonstrated along the distal humeral shaft and extending to the intercondylar region. Fracture lines extend to the medial and lateral condyles. There is impaction and displacement of the distal fracture fragments. No definite dislocation of the elbow joint although limited positioning is available. Moderate size right elbow effusion. IMPRESSION: Comminuted and displaced impacted fractures of the distal right humeral shaft extending into the intercondylar region, involving both medial and lateral condyles. Associated effusion. Electronically Signed   By: Lucienne Capers M.D.   On: 09/15/2015 19:20   Dg Forearm Right  Result Date: 09/15/2015 CLINICAL DATA:  Fall.  Elbow pain. EXAM: RIGHT FOREARM - 2 VIEW COMPARISON:  None. FINDINGS: Acute oblique fracture of the distal humerus. 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 No forearm fracture is seen.  IMPRESSION: 1. Distal humeral fracture. 2. Madelung deformity of the distal radius/ulna. Electronically Signed   By: Van Clines M.D.   On: 09/15/2015 21:38   Ct Elbow Right Wo Contrast  Result Date: 09/16/2015 CLINICAL DATA:  Patient fell wall today at home. Right elbow fractures. EXAM: CT OF THE RIGHT ELBOW WITHOUT CONTRAST; 3-DIMENSIONAL CT IMAGE RENDERING ON ACQUISITION WORKSTATION TECHNIQUE: Multidetector CT imaging was performed according to the standard protocol. Multiplanar CT image reconstructions were also generated. COMPARISON:  Right elbow radiographs 09/15/2015 FINDINGS: Comminuted fractures demonstrated of the distal right humerus. Oblique fracture line begins in the distal shaft and extends towards the intercondylar region. There is mild posterior displacement and overlap of the distal fracture fragments. Transverse fracture lines extend to the medial and lateral epicondyles. Condylar fragments are displaced and distracted. Impaction of the condylar fragments into the distal humerus. The ulnar and radial joints appear preserved without evidence of dislocation at the joints. Fracture lines do not appear to extend to the articular surfaces. Soft tissue swelling and effusion are present. Incidental note of a fracture of the lateral right ribs which could be acute. IMPRESSION: Comminuted fractures of the distal right humerus involving the distal shaft and intercondylar regions. Impaction and distraction of the condylar fragments. Posterior angulation and displacement of the distal shaft fragment. Fracture lines do not appear to be intraarticular and there is no evidence of dislocation of the joint. Incidental note of a right lateral rib fracture which could be acute. Electronically Signed   By: Lucienne Capers M.D.   On: 09/16/2015 01:02   Ct 3d Recon At Scanner  Result Date: 09/16/2015 CLINICAL DATA:  Patient fell wall today at home. Right elbow fractures. EXAM: CT OF THE RIGHT ELBOW  WITHOUT CONTRAST; 3-DIMENSIONAL CT IMAGE RENDERING ON ACQUISITION WORKSTATION TECHNIQUE: Multidetector CT imaging was performed according to the standard protocol. Multiplanar CT image reconstructions were also generated. COMPARISON:  Right elbow radiographs 09/15/2015 FINDINGS: Comminuted fractures demonstrated of the distal right humerus. Oblique fracture line begins in the distal shaft and extends towards the intercondylar region. There is mild posterior displacement and overlap of the distal fracture fragments. Transverse fracture lines extend to the medial and lateral epicondyles.  Condylar fragments are displaced and distracted. Impaction of the condylar fragments into the distal humerus. The ulnar and radial joints appear preserved without evidence of dislocation at the joints. Fracture lines do not appear to extend to the articular surfaces. Soft tissue swelling and effusion are present. Incidental note of a fracture of the lateral right ribs which could be acute. IMPRESSION: Comminuted fractures of the distal right humerus involving the distal shaft and intercondylar regions. Impaction and distraction of the condylar fragments. Posterior angulation and displacement of the distal shaft fragment. Fracture lines do not appear to be intraarticular and there is no evidence of dislocation of the joint. Incidental note of a right lateral rib fracture which could be acute. Electronically Signed   By: Lucienne Capers M.D.   On: 09/16/2015 01:02   Dg Chest Port 1 View  Result Date: 10/11/2015 CLINICAL DATA:  Acute resp failure EXAM: PORTABLE CHEST 1 VIEW COMPARISON:  One day prior FINDINGS: Right internal jugular line is unchanged. Patient rotated left. Nasogastric tube extends beyond the inferior aspect of the film. Tracheostomy appropriately positioned. Numerous leads and wires project over the chest. Cardiomegaly accentuated by AP portable technique. Atherosclerosis in the transverse aorta. Probable left  pleural effusion. No pneumothorax. Mild interstitial edema. Persistent left greater than right base airspace disease. IMPRESSION: No significant change since one day prior. Left worse than right base airspace disease with probable left pleural effusion. Cardiomegaly with mild interstitial edema. Electronically Signed   By: Abigail Miyamoto M.D.   On: 10/11/2015 07:19   Dg Chest Port 1 View  Result Date: 10/10/2015 CLINICAL DATA:  Respiratory failure. EXAM: PORTABLE CHEST 1 VIEW COMPARISON:  10/08/2015. FINDINGS: Tracheostomy tube in satisfactory position. Right jugular catheter tip in the superior vena cava. Interval removal of the left jugular catheter. Nasogastric tube extending into the proximal stomach. Stable enlarged cardiac silhouette. Mildly improved bibasilar airspace opacity and linear density. Thoracic spine and bilateral shoulder degenerative changes. Old left rib fractures. IMPRESSION: 1. Mildly improved left basilar atelectasis or pneumonia. 2. Mildly improved minimal right basilar atelectasis. 3. Stable cardiomegaly Electronically Signed   By: Claudie Revering M.D.   On: 10/10/2015 07:15   Dg Chest Port 1 View  Result Date: 10/08/2015 CLINICAL DATA:  Respiratory failure. EXAM: PORTABLE CHEST 1 VIEW COMPARISON:  10/07/2015 FINDINGS: Tracheostomy tube, right IJ central line, left IJ central line, and nasogastric tube are in place as before. The heart is enlarged. There is persistent dense opacity at the left lung base which obscures the hemidiaphragm. There has been improvement in aeration at the right lung base. No evidence for pulmonary edema. IMPRESSION: Improvement in aeration of the right lung base. Persistent significant opacity in the left lower lobe. Electronically Signed   By: Nolon Nations M.D.   On: 10/08/2015 08:19   Dg Chest Port 1 View  Result Date: 10/07/2015 CLINICAL DATA:  Tracheostomy placement EXAM: PORTABLE CHEST 1 VIEW COMPARISON:  October 06, 2015 FINDINGS: There is no a  tracheostomy catheter present with the tip 4.9 cm above the carina. Central catheter tips are in the superior vena cava. Nasogastric tube tip and side port are below the diaphragm. No pneumothorax. There is focal airspace consolidation in the left base. There are small pleural effusions bilaterally. There is mild right base atelectasis. Heart is mildly enlarged with mild pulmonary venous hypertension. There is atherosclerotic calcification in the aortic arch. No adenopathy evident. There is advanced erosion of the left humeral head, stable. There is calcification in the left  carotid artery. IMPRESSION: Tube and catheter positions as described. No tracheostomy present. No pneumothorax. Left lower lobe airspace consolidation, likely pneumonia, present. There is a degree of underlying pulmonary vascular congestion. Small pleural effusions bilaterally noted. There may well be a degree of frank congestive heart failure superimposed. There is aortic atherosclerosis. There is left carotid artery calcification. Advanced erosion of the left humeral head noted. Electronically Signed   By: Lowella Grip III M.D.   On: 10/07/2015 09:47   Dg Chest Port 1 View  Result Date: 10/06/2015 CLINICAL DATA:  Respiratory failure, shortness of Breath EXAM: PORTABLE CHEST 1 VIEW COMPARISON:  10/05/2015 FINDINGS: Support devices are stable. Mild cardiomegaly. Vascular congestion. Bilateral lower lobe airspace opacities are noted, left greater than right, similar to prior study. Possible small effusions. No acute bony abnormality. IMPRESSION: Bibasilar atelectasis or infiltrates with probable small effusions. Cardiomegaly, vascular congestion. No real change. Electronically Signed   By: Rolm Baptise M.D.   On: 10/06/2015 07:31   Dg Chest Port 1 View  Result Date: 10/05/2015 CLINICAL DATA:  Acute respiratory failure EXAM: PORTABLE CHEST 1 VIEW COMPARISON:  10/04/2015 FINDINGS: Cardiac shadow remains mildly enlarged. A nasogastric  catheter is seen coiled within the stomach. Endotracheal tube and bilateral jugular central lines are again identified and stable. Lungs are well aerated bilaterally. Minimal bibasilar atelectasis remains. No new focal infiltrate is noted. IMPRESSION: Minimal bibasilar atelectasis. Tubes and lines as described. Electronically Signed   By: Inez Catalina M.D.   On: 10/05/2015 07:46   Dg Chest Port 1 View  Result Date: 10/04/2015 CLINICAL DATA:  For Acute respiratory failure. Hx of Asthma, Afib, HTN and Pneumonia. EXAM: PORTABLE CHEST 1 VIEW COMPARISON:  10/03/2015 FINDINGS: Endotracheal tube, NG tube and central venous line unchanged. Stable cardiac silhouette. There is LEFT basilar atelectasis again noted. There is improvement in aeration of the lung bases. No pneumothorax. IMPRESSION: 1. Stable support apparatus. 2. Improved aeration lung bases. Electronically Signed   By: Suzy Bouchard M.D.   On: 10/04/2015 08:38   Dg Chest Port 1 View  Result Date: 10/03/2015 CLINICAL DATA:  Respiratory failure. Subsequent encounter. Intubated patient. EXAM: PORTABLE CHEST 1 VIEW COMPARISON:  10/02/2015 FINDINGS: Lung volumes are low. There is lung base opacity consistent with atelectasis. Probable small effusions. No evidence of pulmonary edema. No pneumothorax. Next item cardiac silhouette is normal in size. No mediastinal or hilar masses. Endotracheal tube, nasal/ orogastric tube, right internal jugular and left internal jugular central lines are all stable. IMPRESSION: 1. No evidence of residual congestive heart failure. No lung base opacity is similar to the prior exam 2. The most consistent with atelectasis.  Probable small effusions. 3. Support apparatus is stable and well positioned. Electronically Signed   By: Lajean Manes M.D.   On: 10/03/2015 07:54   Dg Chest Portable 1 View  Result Date: 10/02/2015 CLINICAL DATA:  Respiratory failure, CHF, atrial fibrillation; status post ORIF of a humeral fracture on  September 24, 2015 EXAM: PORTABLE CHEST 1 VIEW COMPARISON:  Portable chest x-ray dated October 01, 2015 FINDINGS: The lungs remain hypoinflated. Bibasilar densities persist. The left hemidiaphragm remains obscured. The heart is mildly enlarged. The pulmonary vascularity is slightly less engorged. There is calcification in the wall of the aortic arch. The endotracheal tube tip lies 4.1 cm above the carina. The right and left internal jugular venous catheters project over the proximal and mid SVC respectively. There are did chronic changes of the left humeral head. IMPRESSION: Mild CHF, improved. Persistent  bibasilar atelectasis or pneumonia with small pleural effusions. The support tubes are in stable position. Aortic atherosclerosis. Electronically Signed   By: David  Martinique M.D.   On: 10/02/2015 07:49   Dg Chest Port 1 View  Result Date: 10/01/2015 CLINICAL DATA:  Endotracheal tube support.  Shortness of breath. EXAM: PORTABLE CHEST 1 VIEW COMPARISON:  Nine hundred thirteen FINDINGS: Endotracheal tube has its tip 5 cm above the carina. Nasogastric tube enters stomach. Left internal jugular central line has its tip in the SVC 4 cm above the right atrium. Right internal jugular central line has its tip in the SVC at the azygos level. Cardiomegaly persists. There is volume loss in an both lower lobes as seen previously. Small amount of pleural fluid. No significant change. IMPRESSION: No significant change since the study of yesterday. Volume loss/ infiltrate in the lower lobes with small effusions. Cardiomegaly. Lines and tubes satisfactory. Electronically Signed   By: Nelson Chimes M.D.   On: 10/01/2015 07:23   Dg Chest Port 1 View  Result Date: 09/30/2015 CLINICAL DATA:  Endotracheal tube evaluation.  Respiratory failure. EXAM: PORTABLE CHEST 1 VIEW COMPARISON:  Chest radiograph 09/29/2015 FINDINGS: Endotracheal tube tip is in unchanged position at the level of the clavicular heads. Right IJ central venous  catheter tip overlies the lower SVC. Left IJ approach central venous catheter tip also overlies the lower SVC. Enteric tube courses beyond the field of view. There is persistent shallow lung inflation with bilateral pleural effusions, left-greater-than-right. No pneumothorax. Left retrocardiac consolidation and streaky right basilar opacities. IMPRESSION: 1. Unchanged support apparatus. 2. Persistent bilateral small pleural effusions and associated atelectasis. 3. Unchanged left retrocardiac consolidation. Electronically Signed   By: Ulyses Jarred M.D.   On: 09/30/2015 05:53   Dg Chest Port 1 View  Result Date: 09/29/2015 CLINICAL DATA:  Central line placement EXAM: PORTABLE CHEST 1 VIEW COMPARISON:  09/29/2015 FINDINGS: Right jugular central venous catheter with the tip projecting over the SVC. Left jugular central venous catheter projecting over the SVC. Nasogastric tube coursing below the diaphragm. Endotracheal tube with the tip 2.4 cm above the carina. Small left pleural effusion. Bilateral mild interstitial thickening. No pneumothorax. Stable cardiomegaly. No acute osseous abnormality. Severe osteoarthritis of the left glenohumeral joint. IMPRESSION: 1. Endotracheal tube with the tip 2.4 cm above the carina. 2. Right jugular central venous catheter with the tip projecting over the SVC. Electronically Signed   By: Kathreen Devoid   On: 09/29/2015 18:40   Dg Chest Port 1 View  Result Date: 09/29/2015 CLINICAL DATA:  Intubated patient, status post ORIF of humerus fracture ; hypotension, acute respiratory failure, history of CHF EXAM: PORTABLE CHEST 1 VIEW COMPARISON:  Portable chest x-ray of September 28, 2015 FINDINGS: The lungs are reasonably well inflated. There are persistent bibasilar densities with obscuration of the hemidiaphragms. There small bilateral pleural effusions. The cardiac silhouette remains enlarged. The pulmonary vascularity remains engorged. The endotracheal tube tip lies 3.3 cm above  the carina. The esophagogastric tube tip projects below the inferior margin of the image. The left internal jugular venous catheter tip projects over the midportion of the SVC. External pacemaker defibrillator pads are present. IMPRESSION: CHF with mild pulmonary interstitial edema. Bibasilar atelectasis or pneumonia with small bilateral pleural effusions. The pulmonary vascular congestion is more conspicuous today as compared the previous study. Electronically Signed   By: David  Martinique M.D.   On: 09/29/2015 07:31   Dg Chest Port 1 View  Result Date: 09/28/2015 CLINICAL DATA:  Respiratory  failure, history of asthma, atrial fibrillation, recent ORIF of a humeral fracture. EXAM: PORTABLE CHEST 1 VIEW COMPARISON:  Portable chest x-ray of September 27, 2015 FINDINGS: The lungs are less well inflated on the current study. There is bibasilar density consistent with atelectasis and small pleural effusions more conspicuous today. The pulmonary interstitial markings are slightly increased overall as well. The cardiac silhouette remains enlarged. There is calcification in the wall of the aortic arch. External pacemaker defibrillator pads are in stable position. The endotracheal tube tip lies 3.7 cm above the carina. The esophagogastric tube tip projects below the inferior margin of the image. The left internal jugular venous catheter tip projects over the midportion of the SVC. IMPRESSION: Mild interval deterioration in the appearance of the chest. Increased bibasilar atelectasis or infiltrate is present with small bilateral pleural effusions. No pneumothorax. Stable mild pulmonary vascular congestion with mild cardiomegaly. Aortic atherosclerosis. The support tubes are in reasonable position. Electronically Signed   By: David  Martinique M.D.   On: 09/28/2015 07:19   Dg Chest Port 1 View  Result Date: 09/27/2015 CLINICAL DATA:  Intubation EXAM: PORTABLE CHEST 1 VIEW COMPARISON:  September 27, 2015 FINDINGS: The ETT is  in good position as is a left central line. No pneumothorax. Transcutaneous pacers overlie the left chest limiting evaluation. No convincing evidence of focal infiltrate, nodule, or mass. IMPRESSION: The ETT and right central line are in good position. Electronically Signed   By: Dorise Bullion III M.D   On: 09/27/2015 15:36   Dg Chest Port 1 View  Result Date: 09/27/2015 CLINICAL DATA:  Patient has been feeling well today. Postop right humerus, low BP. Tightness in the chest with shortness of breath and wheezing. EXAM: PORTABLE CHEST 1 VIEW COMPARISON:  01/23/2014 FINDINGS: Semi-erect portable view of the chest. There is mild consolidation now present in the left lower lobe with mild air bronchograms, suspicious for infiltrate. There is trace left-sided pleural effusion. A small amount of linear atelectasis is present in the right midlung zone. Heart size is mildly enlarged. There is atherosclerosis of the aorta. No pneumothorax. Old bilateral left greater than right rib fractures. There is post traumatic deformity of the proximal left humerus as before. IMPRESSION: Mild consolidation in the left retrocardiac space with mild air bronchograms and small effusion. Findings are suggestive of pneumonia. Mild cardiomegaly with slight central vascular congestion compared to prior. Atherosclerotic vascular disease of the aorta Electronically Signed   By: Donavan Foil M.D.   On: 09/27/2015 14:48   Dg Abd Portable 1v  Result Date: 10/07/2015 CLINICAL DATA:  Feeding tube placement EXAM: PORTABLE ABDOMEN - 1 VIEW COMPARISON:  None. FINDINGS: Feeding tube tip is in distal stomach. Bowel gas pattern is normal. No bowel obstruction or free air evident. There is moderate stool in the colon. There is consolidation in the left lung base. IMPRESSION: Feeding tube tip in distal stomach. Bowel gas pattern unremarkable. Left base airspace consolidation. Electronically Signed   By: Lowella Grip III M.D.   On: 10/07/2015  09:48   Dg Humerus Right  Result Date: 09/15/2015 CLINICAL DATA:  Pain after trauma EXAM: RIGHT HUMERUS - 2+ VIEW COMPARISON:  None. FINDINGS: The distal humeral fracture is again evident, extending into the intercondylar region. This is seen to better advantage on the accompanying elbow radiographs. The remainder of the humerus is intact. IMPRESSION: Distal humeral fracture with mild apex -anterior angulation. Remainder of the humerus is intact. Electronically Signed   By: Valerie Roys.D.  On: 09/15/2015 21:44   Dg C-arm Gt 120 Min  Result Date: 09/24/2015 CLINICAL DATA:  ORIF. EXAM: RIGHT ELBOW - 2 VIEW; DG C-ARM GT 120 MIN COMPARISON:  CT 09/15/2015. FINDINGS: ORIF distal humeral fracture. Hardware intact. Anatomic alignment. ORIF proximal ulna . Hardware intact. Anatomic alignment. Two images obtained. 0 minutes 23 seconds fluoroscopy time. IMPRESSION: Postsurgical changes about the right elbow . Electronically Signed   By: Marcello Moores  Register   On: 09/24/2015 16:19    Microbiology: No results found for this or any previous visit (from the past 240 hour(s)).   Labs: Basic Metabolic Panel:  Recent Labs Lab 10/09/15 0452  10/10/15 0500 10/11/15 0500 10/12/15 0219 10/13/15 0315 10/14/15 0430  NA 136  < > 139 141 142 147*  147* 151*  K 3.4*  < > 3.6 3.5 3.6 3.5  3.4* 3.5  CL 102  < > 103 100* 102 106  105 104  CO2 25  < > 27 29 30 31  31  33*  GLUCOSE 123*  < > 108* 184* 158* 144*  145* 184*  BUN 81*  < > 106* 112* 105* 92*  92* 78*  CREATININE 2.79*  < > 3.27* 3.47* 3.09* 2.55*  2.56* 2.23*  CALCIUM 9.2  < > 9.3 9.1 9.1 9.6  9.6 9.7  MG 2.2  --  2.0 2.0 1.8 2.2  --   PHOS 1.1*  < > 4.7* 4.2 3.5 3.4  3.4 3.3  < > = values in this interval not displayed. Liver Function Tests:  Recent Labs Lab 10/10/15 0500 10/11/15 0500 10/12/15 0219 10/13/15 0315 10/14/15 0430  AST  --   --   --  27  --   ALT  --   --   --  46  --   ALKPHOS  --   --   --  117  --   BILITOT   --   --   --  0.8  --   PROT  --   --   --  5.2*  --   ALBUMIN 2.8* 2.6* 2.5* 2.6*  2.6* 2.6*   No results for input(s): LIPASE, AMYLASE in the last 168 hours. No results for input(s): AMMONIA in the last 168 hours. CBC:  Recent Labs Lab 10/09/15 0452 10/10/15 0500 10/11/15 0500 10/12/15 0219 10/13/15 0315  WBC 12.7* 14.7* 18.1* 15.5* 14.9*  HGB 7.3* 7.4* 7.6* 7.8* 8.0*  HCT 23.8* 24.0* 24.8* 25.9* 26.5*  MCV 88.1 89.6 90.8 92.2 92.7  PLT 168 202 260 291 328   Cardiac Enzymes: No results for input(s): CKTOTAL, CKMB, CKMBINDEX, TROPONINI in the last 168 hours. BNP: BNP (last 3 results) No results for input(s): BNP in the last 8760 hours.  ProBNP (last 3 results) No results for input(s): PROBNP in the last 8760 hours.  CBG:  Recent Labs Lab 10/14/15 0757 10/14/15 1252 10/14/15 1652 10/15/15 0811 10/15/15 1236  GLUCAP 152* 146* 161* 132* 108*       Signed:  Dia Crawford, MD [facility] [phone] pager

## 2015-10-15 NOTE — Progress Notes (Addendum)
[place] Liaison Visit  Met with family to review [facility] and to offer a bed for transfer today.  Daughter has questions regarding previous consultation with Dr. Domingo Cocking and wishes to speak with MD again, especially regarding whether patient's trach will/will not be removed.  Liaison will return after family has had a chance to discuss.  There is another child not present at hospital and awaiting her return as well.  Notified [employer], CSW.  Please call with any questions. Thank You, Moss Mc RN, BSN, [facility] Director of [place] [phone]

## 2015-10-15 NOTE — Progress Notes (Signed)
Patient will DC to: [facility] Anticipated DC date: 10/15/15 Family notified: Daughters Transport by: Corey Harold   Per MD patient ready for DC to [facility]. RN, patient, patient's family, and facility notified of DC. Discharge Summary sent to facility. RN given number for report. DC packet on chart. Ambulance transport requested for patient.   CSW signing off.  Cedric Fishman, [place] Social Worker [phone]

## 2015-10-15 NOTE — Progress Notes (Signed)
CSW received consult regarding residential Hospice placement. CSW spoke with patient's two daughters at bedside and they would like to proceed with Hospice. CSW sent referral to [facility] for review.  Percell Locus Cleora Karnik LCSWA [phone]

## 2015-10-15 NOTE — Progress Notes (Signed)
[place] Liaison Visit  Met with patient's daughters Lauro Regulus and Langley Gauss at bedside with Dr. Domingo Cocking to answer questions about trach and overall care plan.  Family agrees to [facility] at this time.  Cedric Fishman, MSW updated for transfer today.  Please call with any questions. Thank You, Moss Mc RN, BSN, [facility] Director of [place] [phone]

## 2015-10-15 NOTE — Progress Notes (Signed)
Daily Progress Note   Patient Name: Cristina [name]       Date: 10/15/2015 DOB: [birth year]  Age: 75 y.o. MRN#: [id number] Attending Physician: Allie Bossier, MD Primary Care Physician: Tawanna Solo, MD Admit Date: 09/24/2015  Reason for Consultation/Follow-up: Establishing goals of care  75 yo female PMHx of A fib on xarelto, HTN, HLD, Gout, Asthma, Anxiety. had fall and developed Rt distal humerus fx.  Had repair on 9/07.  Developed bradycardia, hypotension, progressive renal failure >> likely from PNA, and medications (beta blocker/calcium channel blocker). Now s/p prolonged critical illness and delirium, severe malnutrition & deconditioning. She had a trach placed 9/20. Subjective: Met with the patient's 2 daughters in conjunction with Levada Dy from hospice.  Discussed care plan moving forward.  Her daughter Lauro Regulus reports being surprised that Her mother is more interactive today than yesterday. She states that she was worried this might mean that they should reconsider plan to focus on comfort. We discussed that while I would celebrate the fact she is able to interact more with her family, she has not had a clinically significant change that would place her on a pathway towards ever being well enough to leave the hospital and enjoy a quality of life comparable to what she had prior to this admission. Family is in agreement that, this being the case, her wishes would be to focus on comfort moving forward.  Length of Stay: 19  Current Medications: Scheduled Meds:  . arformoterol  15 mcg Nebulization BID  . budesonide (PULMICORT) nebulizer solution  0.5 mg Nebulization BID  . chlorhexidine  15 mL Mouth Rinse BID  . citalopram  20 mg Oral QHS  . mouth rinse  15 mL Mouth Rinse 10 times per day   . pramipexole  2 mg Oral QHS    Continuous Infusions:    PRN Meds: sodium chloride, acetaminophen **OR** acetaminophen, diphenhydrAMINE, fentaNYL (SUBLIMAZE) injection, haloperidol **OR** haloperidol **OR** haloperidol lactate, ipratropium-albuterol, LORazepam **OR** LORazepam **OR** LORazepam, morphine CONCENTRATE **OR** morphine CONCENTRATE, [DISCONTINUED] ondansetron **OR** ondansetron (ZOFRAN) IV, prochlorperazine **OR** prochlorperazine **OR** prochlorperazine, sorbitol  Physical Exam         General: chronically ill appearing female in NAD Neuro:Much less awake today, does open eyes, does not follow commands HEENT:trach c/d/i, sutures in place, R IJ HD cath  Cardiac: irregular Chest: non-labored,  clear  Abd: soft, non tender Ext: 1+ generalized edema Skin: no rashes  Vital Signs: BP (!) 188/85 (BP Location: Left Leg)   Pulse (!) 129   Temp (!) 102.6 F (39.2 C) (Axillary)   Resp 17   Ht '5\' 4"'$  (1.626 m)   Wt 79.3 kg (174 lb 13.2 oz)   SpO2 98%   BMI 30.01 kg/m  SpO2: SpO2: 98 % O2 Device: O2 Device: Tracheostomy Collar O2 Flow Rate: O2 Flow Rate (L/min): 5 L/min  Intake/output summary:   Intake/Output Summary (Last 24 hours) at 10/15/15 1527 Last data filed at 10/15/15 1610  Gross per 24 hour  Intake              150 ml  Output             1700 ml  Net            -1550 ml   LBM: Last BM Date: 10/13/15 Baseline Weight: Weight: 81.2 kg (179 lb) Most recent weight: Weight: 79.3 kg (174 lb 13.2 oz)       Palliative Assessment/Data: Currently 10%    Flowsheet Rows   Flowsheet Row Most Recent Value  Intake Tab  Referral Department  Critical care  Unit at Time of Referral  ICU  Palliative Care Primary Diagnosis  Other (Comment)  Palliative Care Type  New Palliative care  Reason for referral  Clarify Goals of Care  Date first seen by Palliative Care  10/11/15  Clinical Assessment  Palliative Performance Scale Score  20%  Pain Max last 24 hours  4  Pain  Min Last 24 hours  3  Dyspnea Max Last 24 Hours  4  Dyspnea Min Last 24 hours  3  Nausea Max Last 24 Hours  4  Nausea Min Last 24 Hours  3  Anxiety Max Last 24 Hours  4  Anxiety Min Last 24 Hours  3  Psychosocial & Spiritual Assessment  Palliative Care Outcomes  Patient/Family meeting held?  Yes  Who was at the meeting?  patient, 2 daughters and son in law   Palliative Care Outcomes  Clarified goals of care  Palliative Care follow-up planned  Yes, Facility      Patient Active Problem List   Diagnosis Date Noted  . Acute delirium   . Acute renal failure ([place])   . Right supracondylar humerus fracture   . Shock liver   . HCAP (healthcare-associated pneumonia)   . Acute respiratory failure with hypoxia ([place])   . Goals of care, counseling/discussion   . Palliative care encounter   . Status post tracheostomy ([place])   . Surgery, elective   . History of ETT   . Acute hypoxemic respiratory failure ([place])   . Pressure ulcer 10/03/2015  . Persistent atrial fibrillation ([place])   . Encounter for central line placement   . Elevated troponin   . Orthostatic hypotension   . Hypotension due to drugs 09/27/2015  . Bradycardia   . Acute respiratory failure ([place])   . S/P ORIF (open reduction internal fixation) fracture 08/19/2015  . Anaphylactic reaction 01/23/2014  . Essential hypertension 08/07/2013  . Atrial fibrillation ([place]) 07/28/2013  . Hypertensive heart disease 07/28/2013  . Chronic diastolic congestive heart failure ([place]) 07/28/2013  . Obesity (BMI 30-39.9)   . Gout   . Current use of long term anticoagulation   . Acute diastolic CHF (congestive heart failure) ([place]) 07/24/2013    Palliative Care Assessment & Plan   Assessment:  recent  R humerus Fx s/p repair Recent VDRF s/p trach.  Recent renal failure, now off CRRT Deconditioning, generalized   Recommendations/Plan: PLAN: Patient continues to decline.  Met with 2 daughters and reviewed plan for comfort moving forward  including leaving current trach in place, focus on aggressive symptom management, and transition to [facility] for end of life care.  Family was confused about plan for trach care at hospice facility.  Reviewed with family plan for comfort and continuation of suctioning via trach as needed for comfort in conjunction with Levada Dy from hospice.  Code Status:    Code Status Orders        Start     Ordered   10/06/15 1500  Limited resuscitation (code)  Continuous    Question Answer Comment  In the event of cardiac or respiratory ARREST: Perform CPR No   In the event of cardiac or respiratory ARREST: Perform Intubation/Mechanical Ventilation Yes   In the event of cardiac or respiratory ARREST: Perform Defibrillation or Cardioversion if indicated No   Antiarrhythmic drugs - Any drug used to treat arrhythmias No   Vasoactive drug - Any drug used to stabilize blood pressure Yes      10/06/15 1459    Code Status History    Date Active Date Inactive Code Status Order ID Comments User Context   10/06/2015  2:51 PM 10/06/2015  2:59 PM DNR [id number]  Erick Colace, NP Inpatient   09/27/2015  3:08 PM 10/06/2015  2:51 PM Full Code [id number]  Rush Landmark, MD Inpatient   09/24/2015  6:51 PM 09/27/2015  3:08 PM Full Code [id number]  Leandrew Koyanagi, MD Inpatient   08/19/2015  3:20 PM 08/21/2015  6:19 PM Full Code [id number]  Leandrew Koyanagi, MD Inpatient   01/24/2014 12:19 AM 01/24/2014  6:16 PM Full Code [id number]  Rhetta Mura Schorr, NP Inpatient   01/23/2014  7:50 PM 01/24/2014 12:19 AM DNR [id number]  Nita Sells, MD Inpatient   07/22/2013 11:50 PM 07/28/2013  1:52 PM Full Code [id number]  Lonn [place], PA-C Inpatient       Prognosis:   Full comfort including stopping of tube feeds.  Her prognosis is less than 2 weeks.  Discharge Planning:  Hospice facility  Care plan was discussed with 2 daughters, Levada Dy from [facility], bedside RN, and case mgr  Thank you for allowing the Palliative  Medicine Team to assist in the care of this patient.   Time In: 1300 Time Out: 1350 Total Time 50 Prolonged Time Billed No      Greater than 50%  of this time was spent counseling and coordinating care related to the above assessment and plan.  Micheline Rough, MD [phone]  Please contact Palliative Medicine Team phone at [phone] for questions and concerns.

## 2015-10-15 NOTE — Progress Notes (Signed)
SLP Cancellation Note  Patient Details Name: Cristina [name] MRN: [id number] DOB: 08-03-1940   Cancelled treatment:       Reason Eval/Treat Not Completed: Fatigue/lethargy limiting ability to participate;Patient's level of consciousness. Per RN, patient not alert this am. Reviewed chart and noted transition to comfort care. Although patient has not tolerated valve, will continue to follow along should patient wish to attempt verbal communication.   [place], CCC-SLP [phone]    [name] [name] 10/15/2015, 11:00 AM

## 2015-10-15 NOTE — Progress Notes (Signed)
Tracheal sutures removed by RT. Redness noted around trach, gauze placed as barrier.

## 2015-11-18 DEATH — deceased

## 2018-03-25 IMAGING — CR DG ELBOW 2V*R*
2 series · 2 of 2 positions shown · non-contrast
Comparison: None.

CLINICAL DATA: Right elbow pain and bruising posteriorly after a
fall today.

EXAM:
RIGHT ELBOW - 2 VIEW

[x elbow lat right]
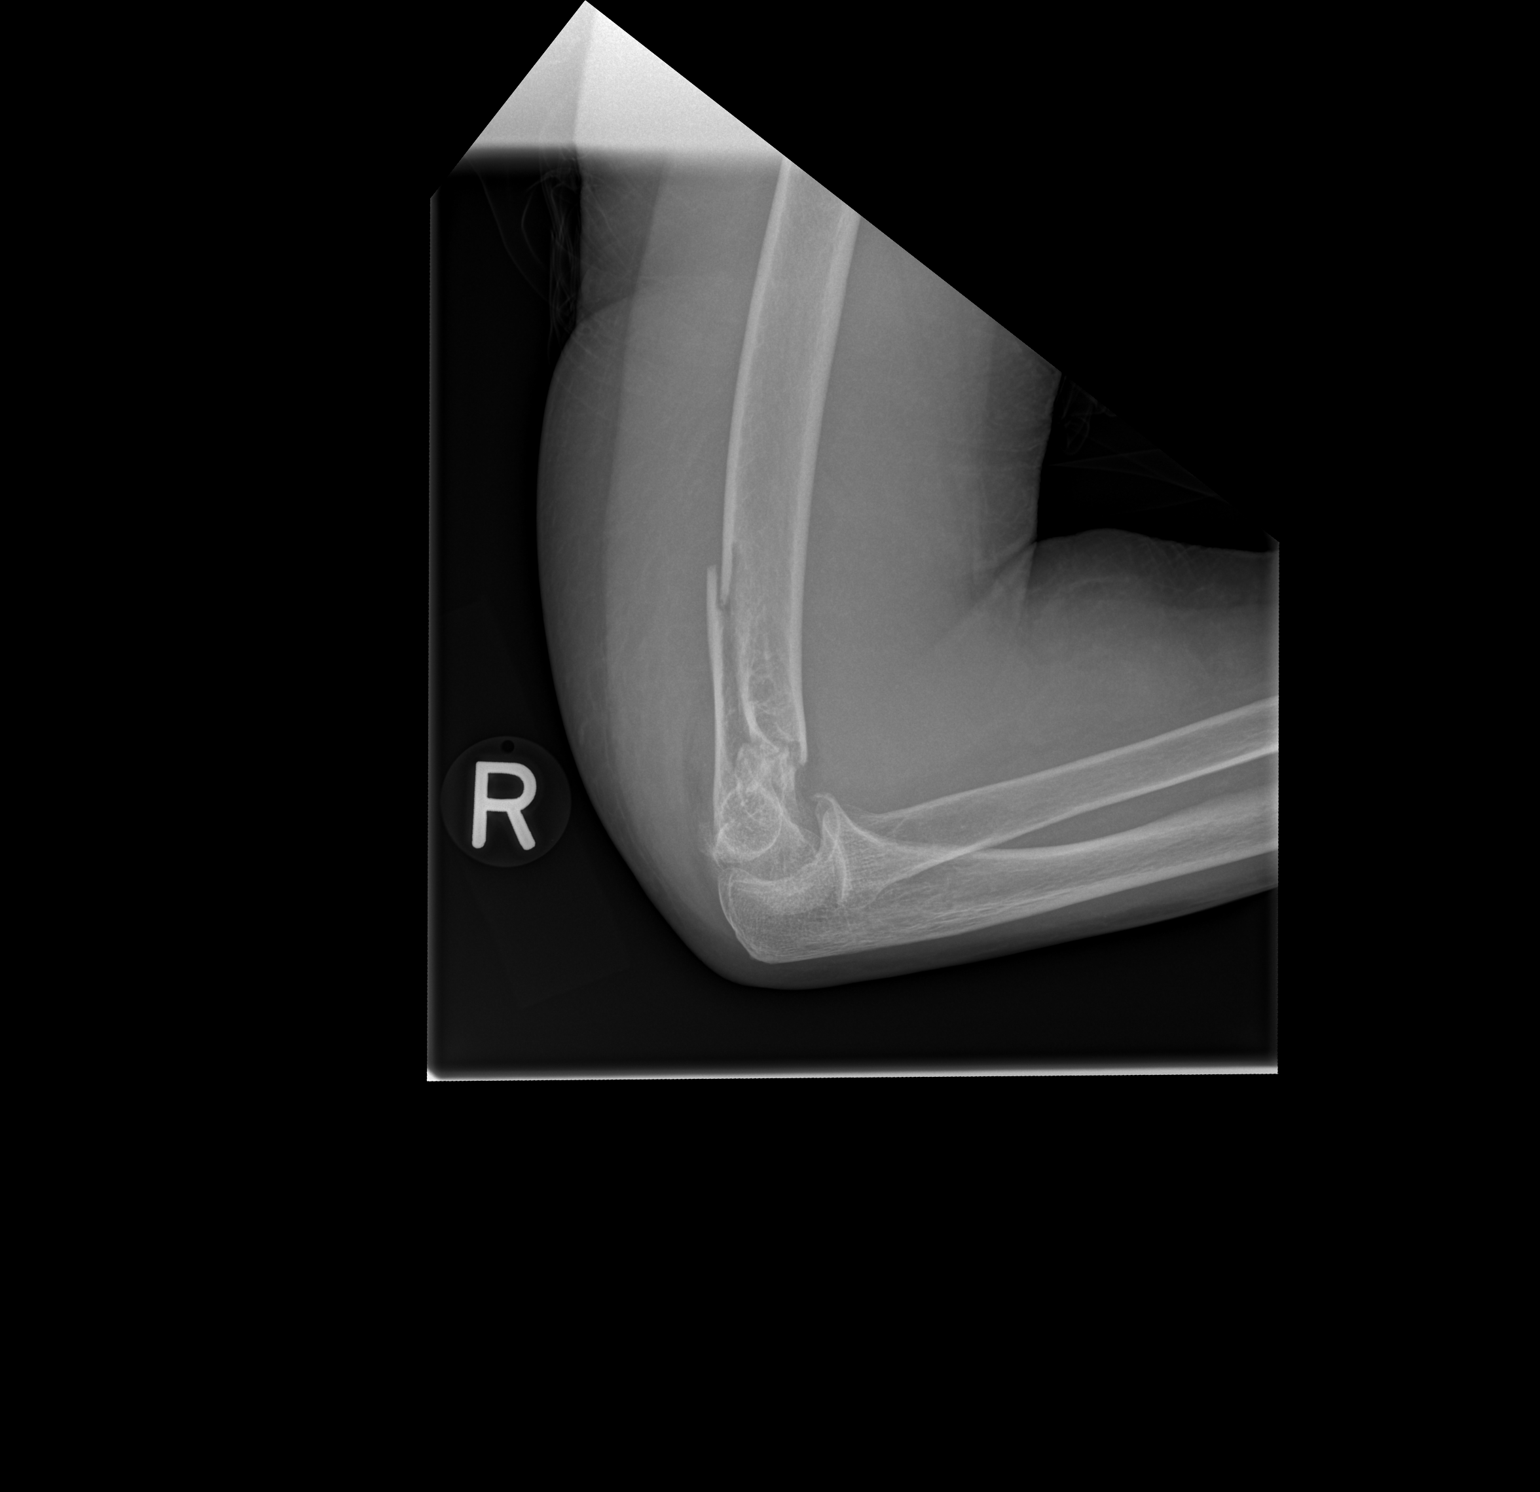

[x elbow ap right]
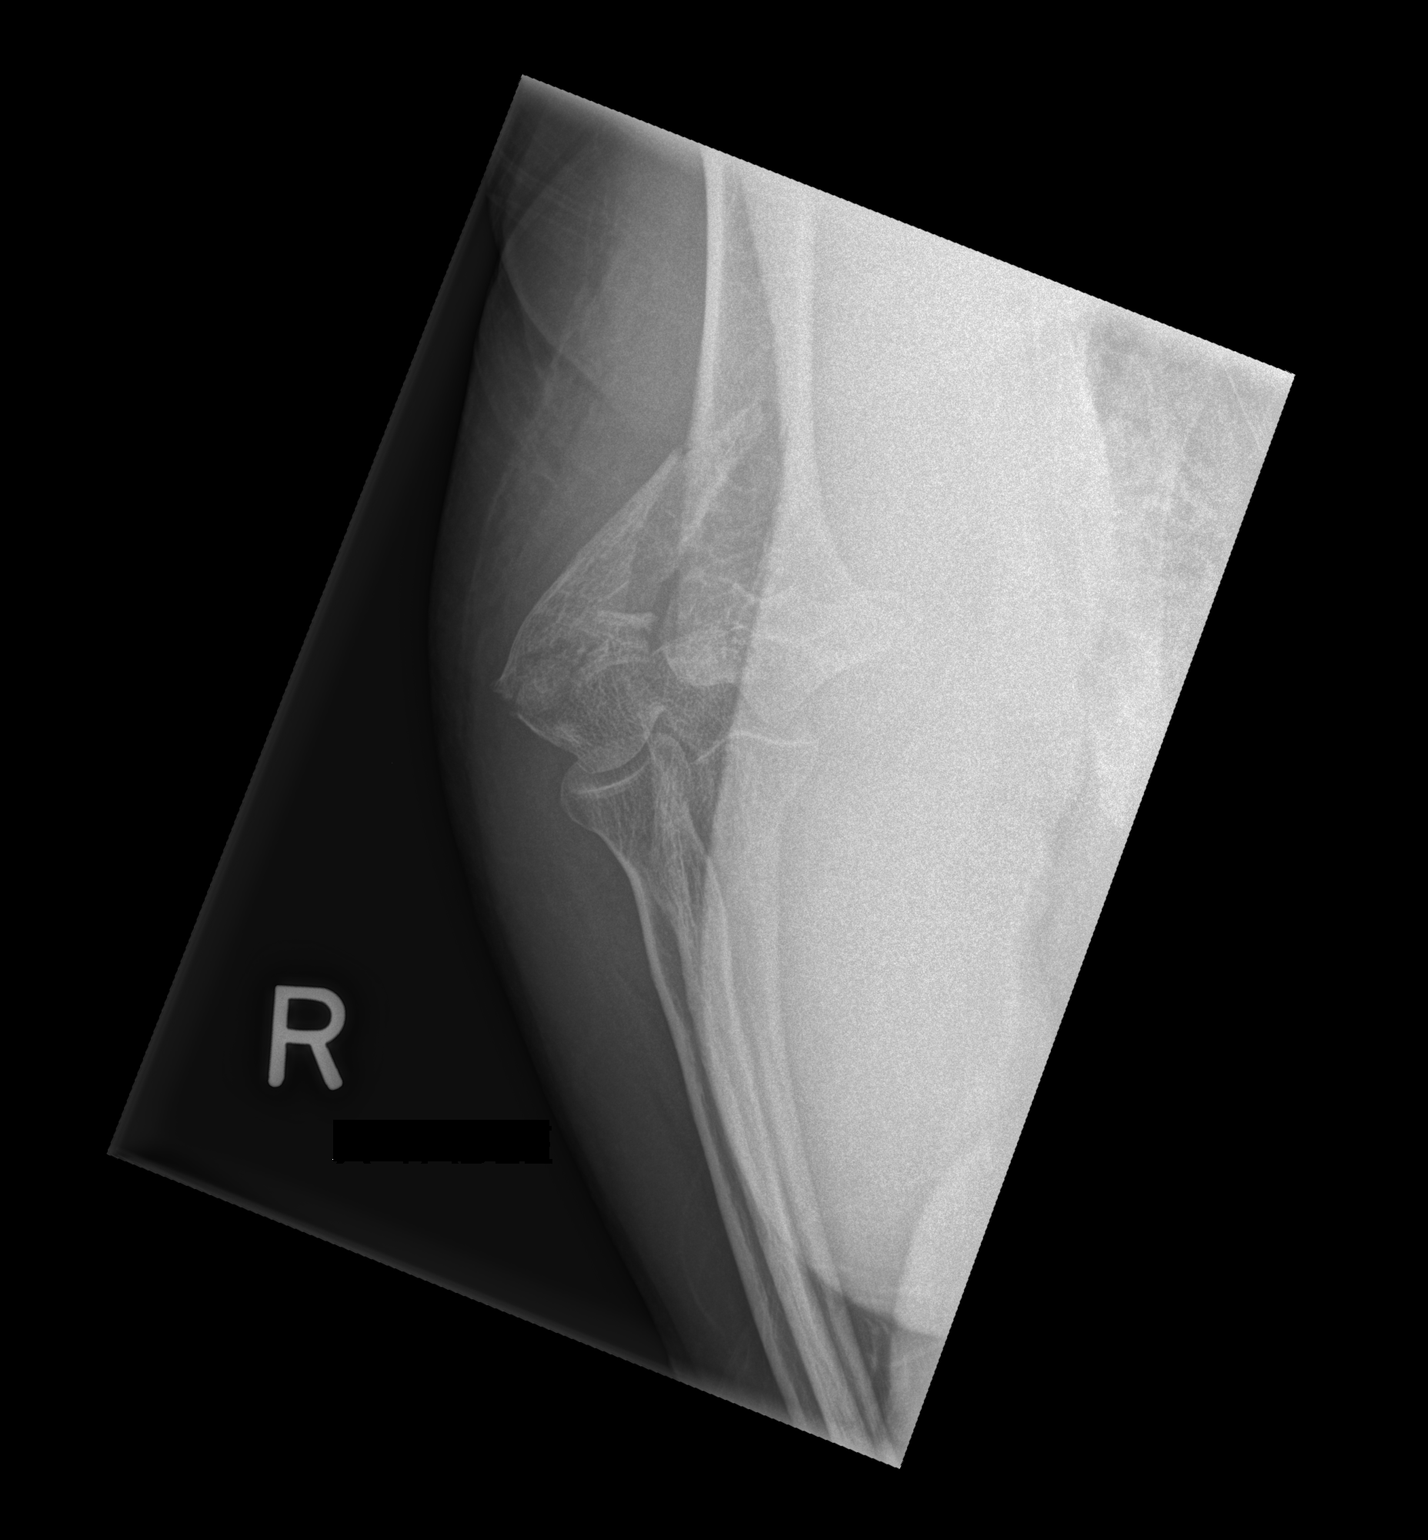

[2 of 2 positions shown; findings below may reference images not displayed]

FINDINGS: Oblique comminuted fracture demonstrated along the distal humeral
shaft and extending to the intercondylar region. Fracture lines
extend to the medial and lateral condyles. There is impaction and
displacement of the distal fracture fragments. No definite
dislocation of the elbow joint although limited positioning is
available. Moderate size right elbow effusion.
IMPRESSION: Comminuted and displaced impacted fractures of the distal right
humeral shaft extending into the intercondylar region, involving
both medial and lateral condyles. Associated effusion.

## 2018-03-25 IMAGING — CT CT 3D ACQUISTION WKST
4 of 8 series · 11 of 34 positions shown, 13 images · non-contrast
Comparison: Right elbow radiographs 09/15/2015

CLINICAL DATA: Patient fell wall today at home. Right elbow
fractures.

EXAM:
CT OF THE RIGHT ELBOW WITHOUT CONTRAST; 3-DIMENSIONAL CT IMAGE
RENDERING ON ACQUISITION WORKSTATION
TECHNIQUE: Multidetector CT imaging was performed according to the standard
protocol. Multiplanar CT image reconstructions were also generated.

[Series 603: <mpr thick range> · axial · 0.32mm/px · z∈[-471,-419]mm · 2 of 87 slices shown, 3 images]
[im 29/87  soft-tissue]
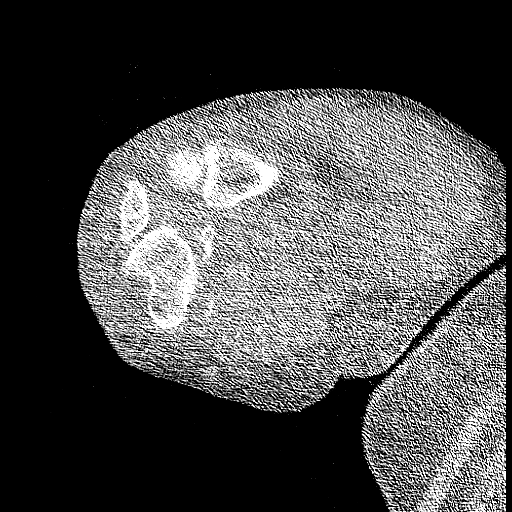
[im 29/87  bone]
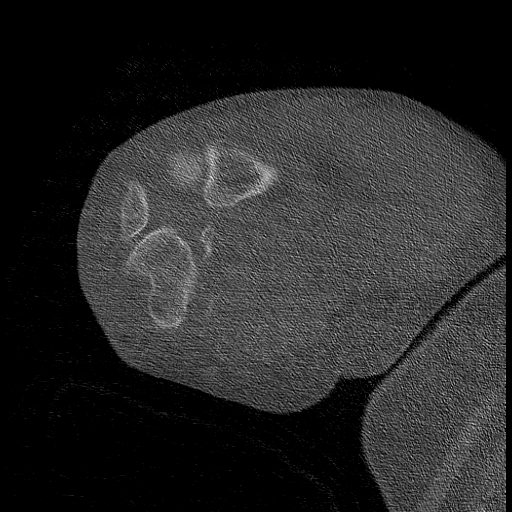
[im 58/87  bone]
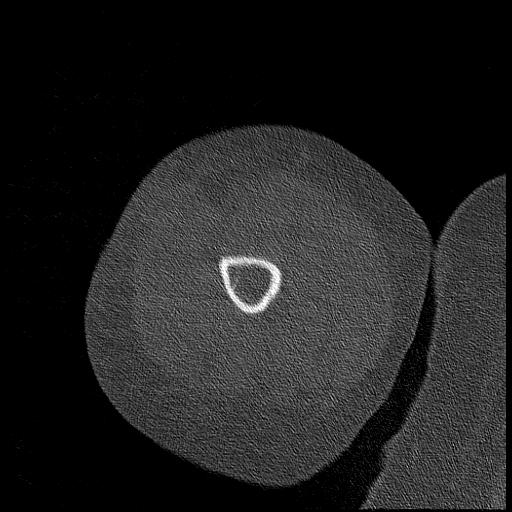

[Series 609: <mpr thick range(3)> · axial · 0.32mm/px · z∈[-473,-420]mm · 2 of 87 slices shown]
[im 29/87  bone]
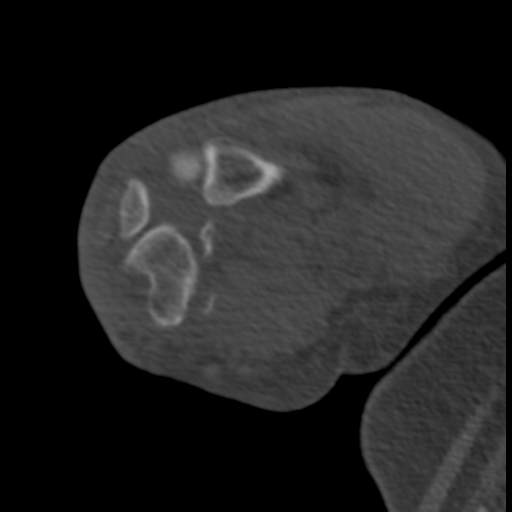
[im 58/87  bone]
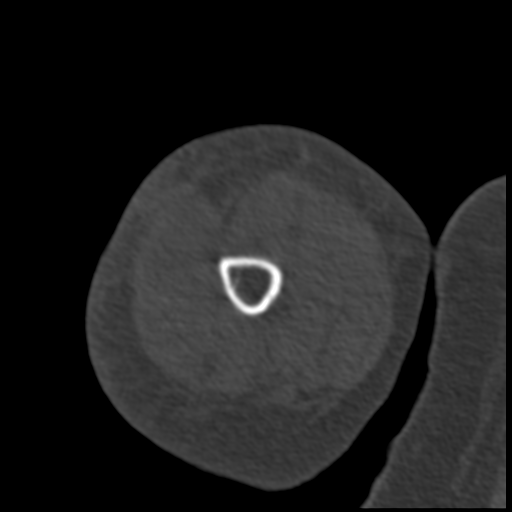

[Series 613: <mpr range> · coronal · 0.32mm/px · 2 of 62 slices shown]
[im 3/62  bone]
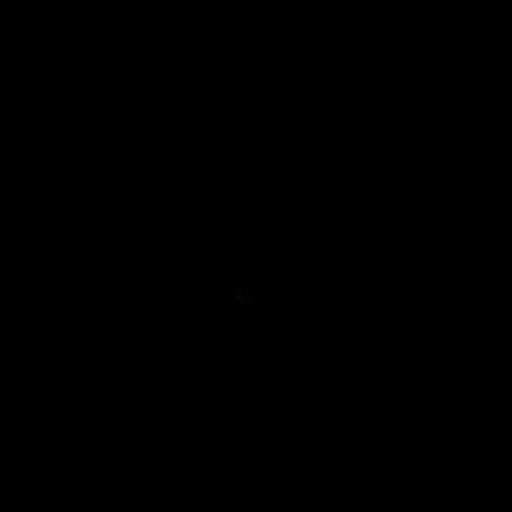
[im 60/62  bone]
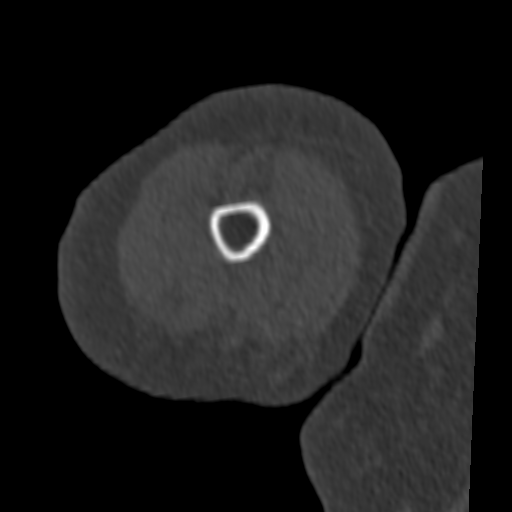

[Series 615: <mpr range(2)> · sagittal · 0.32mm/px · 5 of 44 slices shown, 6 images]
[im 15/44  bone]
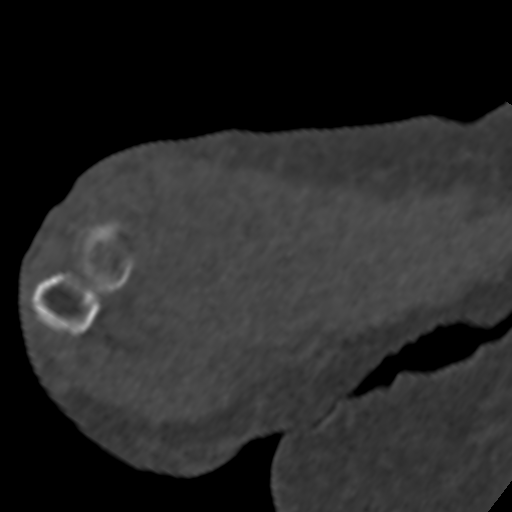
[im 18/44  bone]
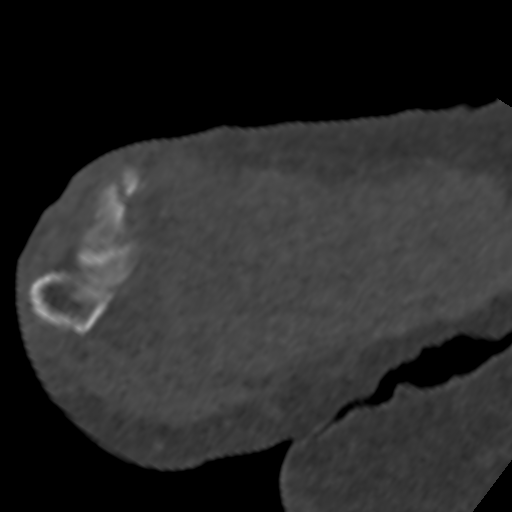
[im 22/44  soft-tissue]
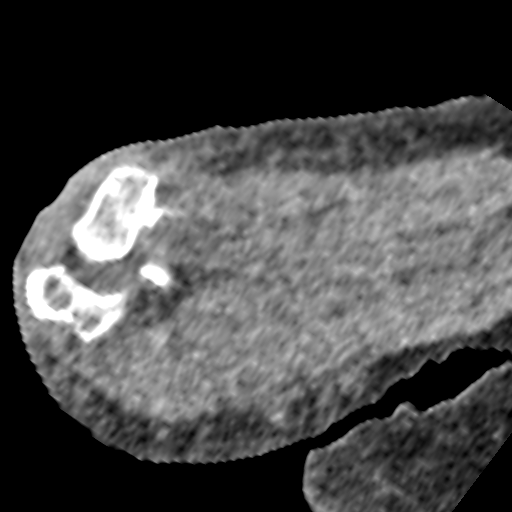
[im 22/44  bone]
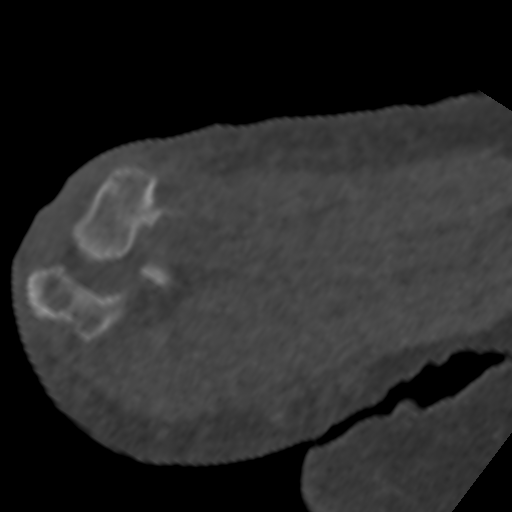
[im 26/44  bone]
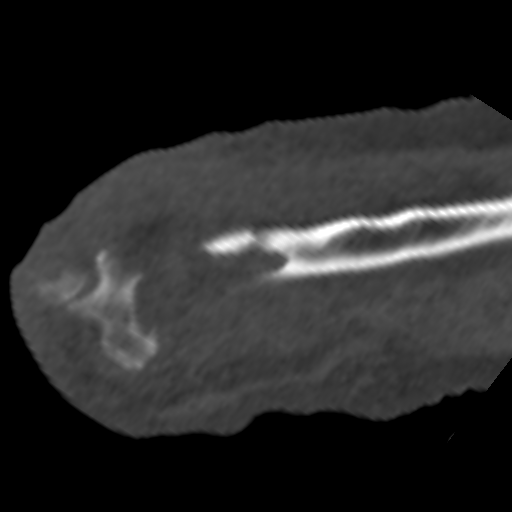
[im 29/44  bone]
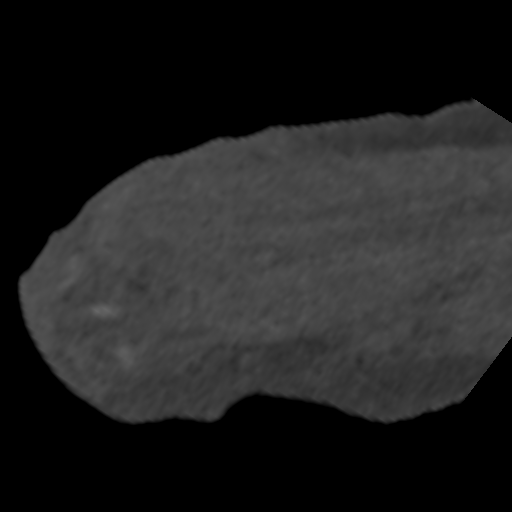

[11 of 34 positions shown; findings below may reference images not displayed]

FINDINGS: Comminuted fractures demonstrated of the distal right humerus.
Oblique fracture line begins in the distal shaft and extends towards
the intercondylar region. There is mild posterior displacement and
overlap of the distal fracture fragments. Transverse fracture lines
extend to the medial and lateral epicondyles. Condylar fragments are
displaced and distracted. Impaction of the condylar fragments into
the distal humerus. The ulnar and radial joints appear preserved
without evidence of dislocation at the joints. Fracture lines do not
appear to extend to the articular surfaces. Soft tissue swelling and
effusion are present. Incidental note of a fracture of the lateral
right ribs which could be acute.
IMPRESSION: Comminuted fractures of the distal right humerus involving the
distal shaft and intercondylar regions. Impaction and distraction of
the condylar fragments. Posterior angulation and displacement of the
distal shaft fragment. Fracture lines do not appear to be
intraarticular and there is no evidence of dislocation of the joint.
Incidental note of a right lateral rib fracture which could be
acute.
